# Patient Record
Sex: Female | Born: 1940 | Race: Black or African American | Hispanic: No | Marital: Married | Smoking: Never smoker
Health system: Southern US, Community
[De-identification: ages and names within clinical notes are randomized; demographics above are authoritative.]

## PROBLEM LIST (undated history)

## (undated) DIAGNOSIS — K589 Irritable 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 diarrhea: Secondary | ICD-10-CM

## (undated) DIAGNOSIS — K5900 Constipation, unspecified: Secondary | ICD-10-CM

## (undated) DIAGNOSIS — I340 Nonrheumatic mitral (valve) insufficiency: Secondary | ICD-10-CM

## (undated) DIAGNOSIS — R7303 Prediabetes: Secondary | ICD-10-CM

## (undated) DIAGNOSIS — N281 Cyst of kidney, acquired: Secondary | ICD-10-CM

## (undated) DIAGNOSIS — K5790 Diverticulosis of intestine, part unspecified, without perforation or abscess without bleeding: Secondary | ICD-10-CM

## (undated) DIAGNOSIS — R011 Cardiac murmur, unspecified: Secondary | ICD-10-CM

## (undated) DIAGNOSIS — R0600 Dyspnea, unspecified: Secondary | ICD-10-CM

## (undated) DIAGNOSIS — M545 Low back pain, unspecified: Secondary | ICD-10-CM

## (undated) DIAGNOSIS — M1990 Unspecified osteoarthritis, unspecified site: Secondary | ICD-10-CM

## (undated) DIAGNOSIS — J189 Pneumonia, unspecified organism: Secondary | ICD-10-CM

## (undated) DIAGNOSIS — Z9889 Other specified postprocedural states: Secondary | ICD-10-CM

## (undated) DIAGNOSIS — N1831 Chronic kidney disease, stage 3a: Secondary | ICD-10-CM

## (undated) DIAGNOSIS — N189 Chronic kidney disease, unspecified: Secondary | ICD-10-CM

## (undated) DIAGNOSIS — R112 Nausea with vomiting, unspecified: Secondary | ICD-10-CM

## (undated) DIAGNOSIS — I700 Atherosclerosis of aorta: Secondary | ICD-10-CM

## (undated) DIAGNOSIS — I10 Essential (primary) hypertension: Secondary | ICD-10-CM

## (undated) DIAGNOSIS — I82401 Acute embolism and thrombosis of unspecified deep veins of right lower extremity: Secondary | ICD-10-CM

## (undated) HISTORY — DX: Cardiac murmur, unspecified: R01.1

## (undated) HISTORY — PX: BREAST CYST EXCISION: SHX579

## (undated) HISTORY — DX: Cyst of kidney, acquired: N28.1

## (undated) HISTORY — PX: BREAST SURGERY: SHX581

## (undated) HISTORY — DX: Other specified postprocedural states: Z98.890

## (undated) HISTORY — PX: COLONOSCOPY: SHX174

## (undated) HISTORY — DX: Essential (primary) hypertension: I10

## (undated) HISTORY — DX: Irritable bowel syndrome, unspecified: K58.9

## (undated) HISTORY — PX: BREAST EXCISIONAL BIOPSY: SUR124

## (undated) HISTORY — PX: ABDOMINAL HYSTERECTOMY: SHX81

## (undated) HISTORY — PX: JOINT REPLACEMENT: SHX530

## (undated) HISTORY — PX: OTHER SURGICAL HISTORY: SHX169

---

## 2015-08-21 HISTORY — PX: REPLACEMENT TOTAL KNEE: SUR1224

## 2019-04-30 ENCOUNTER — Other Ambulatory Visit: Payer: Self-pay

## 2019-05-04 ENCOUNTER — Encounter: Payer: Self-pay | Admitting: Family Medicine

## 2019-05-04 ENCOUNTER — Ambulatory Visit (INDEPENDENT_AMBULATORY_CARE_PROVIDER_SITE_OTHER): Payer: Medicare Other | Admitting: Family Medicine

## 2019-05-04 ENCOUNTER — Other Ambulatory Visit: Payer: Self-pay

## 2019-05-04 VITALS — BP 142/88 | HR 70 | Temp 96.5°F | Resp 18 | Ht 64.0 in | Wt 150.6 lb

## 2019-05-04 DIAGNOSIS — Z23 Encounter for immunization: Secondary | ICD-10-CM | POA: Diagnosis not present

## 2019-05-04 DIAGNOSIS — Z0100 Encounter for examination of eyes and vision without abnormal findings: Secondary | ICD-10-CM

## 2019-05-04 DIAGNOSIS — M1711 Unilateral primary osteoarthritis, right knee: Secondary | ICD-10-CM

## 2019-05-04 DIAGNOSIS — I10 Essential (primary) hypertension: Secondary | ICD-10-CM

## 2019-05-04 DIAGNOSIS — R7303 Prediabetes: Secondary | ICD-10-CM | POA: Diagnosis not present

## 2019-05-04 LAB — CBC WITH DIFFERENTIAL/PLATELET
Basophils Absolute: 0 10*3/uL (ref 0.0–0.1)
Basophils Relative: 0.4 % (ref 0.0–3.0)
Eosinophils Absolute: 0.1 10*3/uL (ref 0.0–0.7)
Eosinophils Relative: 0.9 % (ref 0.0–5.0)
HCT: 37.7 % (ref 36.0–46.0)
Hemoglobin: 12.2 g/dL (ref 12.0–15.0)
Lymphocytes Relative: 47.5 % — ABNORMAL HIGH (ref 12.0–46.0)
Lymphs Abs: 3.5 10*3/uL (ref 0.7–4.0)
MCHC: 32.4 g/dL (ref 30.0–36.0)
MCV: 88.7 fl (ref 78.0–100.0)
Monocytes Absolute: 0.7 10*3/uL (ref 0.1–1.0)
Monocytes Relative: 9.1 % (ref 3.0–12.0)
Neutro Abs: 3.1 10*3/uL (ref 1.4–7.7)
Neutrophils Relative %: 42.1 % — ABNORMAL LOW (ref 43.0–77.0)
Platelets: 281 10*3/uL (ref 150.0–400.0)
RBC: 4.25 Mil/uL (ref 3.87–5.11)
RDW: 13.6 % (ref 11.5–15.5)
WBC: 7.4 10*3/uL (ref 4.0–10.5)

## 2019-05-04 LAB — COMPREHENSIVE METABOLIC PANEL
ALT: 9 U/L (ref 0–35)
AST: 17 U/L (ref 0–37)
Albumin: 3.9 g/dL (ref 3.5–5.2)
Alkaline Phosphatase: 76 U/L (ref 39–117)
BUN: 28 mg/dL — ABNORMAL HIGH (ref 6–23)
CO2: 31 mEq/L (ref 19–32)
Calcium: 9.8 mg/dL (ref 8.4–10.5)
Chloride: 100 mEq/L (ref 96–112)
Creatinine, Ser: 1.02 mg/dL (ref 0.40–1.20)
GFR: 63.64 mL/min (ref >60.00)
Glucose, Bld: 95 mg/dL (ref 70–99)
Potassium: 3.7 mEq/L (ref 3.5–5.1)
Sodium: 139 mEq/L (ref 135–145)
Total Bilirubin: 0.4 mg/dL (ref 0.2–1.2)
Total Protein: 6.6 g/dL (ref 6.0–8.3)

## 2019-05-04 LAB — LIPID PANEL
Cholesterol: 165 mg/dL (ref 0–200)
HDL: 55.2 mg/dL (ref >39.00)
LDL Cholesterol: 90 mg/dL (ref 0–99)
NonHDL: 110.03
Total CHOL/HDL Ratio: 3
Triglycerides: 102 mg/dL (ref 0.0–149.0)
VLDL: 20.4 mg/dL (ref 0.0–40.0)

## 2019-05-04 LAB — TSH: TSH: 0.96 u[IU]/mL (ref 0.35–4.50)

## 2019-05-04 LAB — HEMOGLOBIN A1C: Hgb A1c MFr Bld: 6.3 % (ref 4.6–6.5)

## 2019-05-04 NOTE — Progress Notes (Signed)
Subjective:    Patient ID: Donna Ferguson, female    DOB: 03-Apr-1941, 78 y.o.   MRN: [id number]  HPI  Patient presents to clinic to establish PCP.  She moved to area about a month ago from [place].  Currently on hydrochlorothiazide and metoprolol for hypertension.  Feels good on these medicines, denies palpitations, chest pain or lower extremity swelling.  Has not had blood work in approximately 3 to 4 months.  Is agreeable to get new blood work today.  Also states prior to her move, she was in the process of getting a right knee replacement however that had to be put on hold due to the pandemic and also in the move.  Would like to see an orthopedic here to get the process rolling of right knee osteoarthritis treatment/right knee replacement.  Also reports a history of prediabetes, we will get A1c and labs today.  Requesting referral to eye doctor due to being new to the area.  Past medical, social, surgical and family history reviewed and updated accordingly in chart.  Past Medical History:  Diagnosis Date  . Hypertension    Social History   Tobacco Use  . Smoking status: Never Smoker  . Smokeless tobacco: Never Used  Substance Use Topics  . Alcohol use: Never    Frequency: Never   Past Surgical History:  Procedure Laterality Date  . ABDOMINAL HYSTERECTOMY    . cyst N/A    cyst removal both breast  . REPLACEMENT TOTAL KNEE  2017   History reviewed. No pertinent family history.  Review of Systems  Constitutional: Negative for chills, fatigue and fever.  HENT: Negative for congestion, ear pain, sinus pain and sore throat.   Eyes: Negative.   Respiratory: Negative for cough, shortness of breath and wheezing.   Cardiovascular: Negative for chest pain, palpitations and leg swelling.  Gastrointestinal: Negative for abdominal pain, diarrhea, nausea and vomiting.  Genitourinary: Negative for dysuria, frequency and urgency.  Musculoskeletal: +right knee pain  Skin: Negative for color change, pallor and rash.  Neurological: Negative for syncope, light-headedness and headaches.  Psychiatric/Behavioral: The patient is not nervous/anxious.       Objective:   Physical Exam Vitals signs and nursing note reviewed.  Constitutional:      General: She is not in acute distress.    Appearance: She is not ill-appearing, toxic-appearing or diaphoretic.  HENT:     Head: Normocephalic and atraumatic.  Eyes:     General: No scleral icterus.    Extraocular Movements: Extraocular movements intact.     Pupils: Pupils are equal, round, and reactive to light.  Cardiovascular:     Rate and Rhythm: Normal rate and regular rhythm.     Heart sounds: Normal heart sounds.  Pulmonary:     Effort: Pulmonary effort is normal.     Breath sounds: Normal breath sounds.  Musculoskeletal:        General: Tenderness (chroic R knee pain. Hx of knee replacment) present.     Right lower leg: No edema.     Left lower leg: No edema.  Skin:    General: Skin is warm and dry.     Capillary Refill: Capillary refill takes less than 2 seconds.     Coloration: Skin is not jaundiced or pale.  Neurological:     General: No focal deficit present.     Mental Status: She is alert and oriented to person, place, and time.     Gait: Gait normal.  Psychiatric:        Mood and Affect: Mood normal.        Behavior: Behavior normal.    Vitals:   05/04/19 0846  BP: (!) 142/88  Pulse: 70  Resp: 18  Temp: (!) 96.5 F (35.8 C)  SpO2: 90%      Assessment & Plan:    Essential hypertension-stable on current medications hydrochlorothiazide metoprolol.  We will continue.   Prediabetes  -- has history of this.  We will get new blood work.  Osteoarthritis right knee-we will refer to orthopedics for evaluation  Lab work collected in clinic  Flu vaccine given in clinic  We will plan to have patient follow-up in office in about 6 months for recheck of chronic conditions.  She is  aware she can always return clinic sooner or later if issues arise.

## 2019-05-29 DIAGNOSIS — M25561 Pain in right knee: Secondary | ICD-10-CM | POA: Insufficient documentation

## 2019-05-29 DIAGNOSIS — M1711 Unilateral primary osteoarthritis, right knee: Secondary | ICD-10-CM | POA: Insufficient documentation

## 2019-09-24 ENCOUNTER — Telehealth: Payer: Self-pay | Admitting: Internal Medicine

## 2019-09-24 ENCOUNTER — Other Ambulatory Visit: Payer: Self-pay | Admitting: Internal Medicine

## 2019-09-24 ENCOUNTER — Other Ambulatory Visit: Payer: Self-pay | Admitting: Family Medicine

## 2019-09-24 MED ORDER — METOPROLOL SUCCINATE ER 50 MG PO TB24: 50.0000 mg | ORAL_TABLET | Freq: Every day | ORAL | 0 refills | Status: DC

## 2019-09-24 MED ORDER — HYDROCHLOROTHIAZIDE 25 MG PO TABS: 25.0000 mg | ORAL_TABLET | Freq: Every day | ORAL | 0 refills | Status: DC

## 2019-09-24 NOTE — Telephone Encounter (Signed)
Patient has TOC with in March ok to fill medication for 30 days. Was Guse NP patient must have approval to fill.

## 2019-09-24 NOTE — Telephone Encounter (Signed)
meds refilled for 30 days,  Printed and signed because there is no pharmacy in chart,  It's [facility]

## 2019-09-24 NOTE — Telephone Encounter (Signed)
Pt needs a refill on hydrochlorothiazide (HYDRODIURIL) 25 MG tablet and metoprolol succinate (TOPROL-XL) 50 MG 24 hr tablet sent to [facility] on [address] she has made a TOC appt

## 2019-09-28 ENCOUNTER — Other Ambulatory Visit: Payer: Self-pay

## 2019-09-28 MED ORDER — HYDROCHLOROTHIAZIDE 25 MG PO TABS: 25.0000 mg | ORAL_TABLET | Freq: Every day | ORAL | 0 refills | Status: DC

## 2019-09-28 MED ORDER — METOPROLOL SUCCINATE ER 50 MG PO TB24: 50.0000 mg | ORAL_TABLET | Freq: Every day | ORAL | 0 refills | Status: DC

## 2019-10-15 DIAGNOSIS — H2513 Age-related nuclear cataract, bilateral: Secondary | ICD-10-CM | POA: Insufficient documentation

## 2019-10-15 DIAGNOSIS — H518 Other specified disorders of binocular movement: Secondary | ICD-10-CM | POA: Insufficient documentation

## 2019-10-15 DIAGNOSIS — H5032 Intermittent alternating esotropia: Secondary | ICD-10-CM | POA: Insufficient documentation

## 2019-10-28 ENCOUNTER — Ambulatory Visit (INDEPENDENT_AMBULATORY_CARE_PROVIDER_SITE_OTHER): Payer: Medicare Other | Admitting: Family

## 2019-10-28 ENCOUNTER — Other Ambulatory Visit: Payer: Self-pay

## 2019-10-28 ENCOUNTER — Encounter: Payer: Self-pay | Admitting: Family

## 2019-10-28 ENCOUNTER — Other Ambulatory Visit: Payer: Self-pay | Admitting: Family

## 2019-10-28 VITALS — BP 152/80 | HR 62 | Temp 97.1°F | Ht 64.0 in | Wt 154.8 lb

## 2019-10-28 DIAGNOSIS — Z780 Asymptomatic menopausal state: Secondary | ICD-10-CM | POA: Diagnosis not present

## 2019-10-28 DIAGNOSIS — R7303 Prediabetes: Secondary | ICD-10-CM | POA: Insufficient documentation

## 2019-10-28 DIAGNOSIS — Z1231 Encounter for screening mammogram for malignant neoplasm of breast: Secondary | ICD-10-CM | POA: Diagnosis not present

## 2019-10-28 DIAGNOSIS — M25561 Pain in right knee: Secondary | ICD-10-CM | POA: Diagnosis not present

## 2019-10-28 DIAGNOSIS — I10 Essential (primary) hypertension: Secondary | ICD-10-CM

## 2019-10-28 DIAGNOSIS — G8929 Other chronic pain: Secondary | ICD-10-CM

## 2019-10-28 LAB — COMPREHENSIVE METABOLIC PANEL
ALT: 12 U/L (ref 0–35)
AST: 19 U/L (ref 0–37)
Albumin: 4 g/dL (ref 3.5–5.2)
Alkaline Phosphatase: 77 U/L (ref 39–117)
BUN: 22 mg/dL (ref 6–23)
CO2: 29 mEq/L (ref 19–32)
Calcium: 9.8 mg/dL (ref 8.4–10.5)
Chloride: 100 mEq/L (ref 96–112)
Creatinine, Ser: 1.15 mg/dL (ref 0.40–1.20)
GFR: 55.34 mL/min — ABNORMAL LOW (ref >60.00)
Glucose, Bld: 91 mg/dL (ref 70–99)
Potassium: 3.3 mEq/L — ABNORMAL LOW (ref 3.5–5.1)
Sodium: 137 mEq/L (ref 135–145)
Total Bilirubin: 0.7 mg/dL (ref 0.2–1.2)
Total Protein: 6.9 g/dL (ref 6.0–8.3)

## 2019-10-28 MED ORDER — POTASSIUM CHLORIDE ER 10 MEQ PO TBCR: 10.0000 meq | EXTENDED_RELEASE_TABLET | ORAL | 1 refills | Status: DC

## 2019-10-28 NOTE — Progress Notes (Signed)
Subjective:    Patient ID: Donna Ferguson, female    DOB: Sep 01, 1940, 78 y.o.   MRN: [id number]  CC: Donna Ferguson is a 79 y.o. female who presents today to establish care.    HPI: Feels well today. No complaints.  From [place]. Enjoys painting HTN- notes had salty meal last night, [place] [employer].  Denies exertional chest pain or pressure, numbness or tingling radiating to left arm or jaw, palpitations, dizziness, frequent headaches, changes in vision, or shortness of breath.  Has been on hctz, toprol for a long time. Had been 132/80 at orthopedic 10/19/19 No h/o MI.  Right knee pain- planning TKR with Dr Roland Rack.  01/05/2018.  LKR 4 years ago in [place] .  Prediabetes- controlling with diet. Tries to limits sweets.   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   No longer screening for colon cancer.    HISTORY:  Past Medical History:  Diagnosis Date  . Heart murmur   . History of colonoscopy    2019 in [place]; patient states no longer screening for colon cancer.  . Hypertension   . IBS (irritable bowel syndrome)    Past Surgical History:  Procedure Laterality Date  . ABDOMINAL HYSTERECTOMY    . cyst N/A    cyst removal both breast  . REPLACEMENT TOTAL KNEE  2017   Family History  Problem Relation Age of Onset  . Hypertension Mother   . Diabetes Father   . Hypertension Father   . Heart disease Father   . Hypertension Sister   . Hypertension Daughter     Allergies: Patient has no known allergies. Current Outpatient Medications on File Prior to Visit  Medication Sig Dispense Refill  . Ascorbic Acid (VITAMIN C) 1000 MG tablet Take 1,000 mg by mouth daily.    . hydrochlorothiazide (HYDRODIURIL) 25 MG tablet Take 1 tablet (25 mg total) by mouth daily. 30 tablet 0  . metoprolol succinate (TOPROL-XL) 50 MG 24 hr tablet Take 1 tablet (50 mg total) by mouth daily. Take with or immediately following a meal. 30 tablet 0   No current  facility-administered medications on file prior to visit.    Social History   Tobacco Use  . Smoking status: Never Smoker  . Smokeless tobacco: Never Used  Substance Use Topics  . Alcohol use: Never  . Drug use: Never    Review of Systems  Constitutional: Negative for chills and fever.  Respiratory: Negative for cough, shortness of breath and wheezing.   Cardiovascular: Negative for chest pain, palpitations and leg swelling.  Gastrointestinal: Negative for nausea and vomiting.  Neurological: Negative for headaches.      Objective:    BP (!) 152/80   Pulse 62   Temp (!) 97.1 F (36.2 C) (Temporal)   Ht 5\' 4"  (1.626 m)   Wt 154 lb 12.8 oz (70.2 kg)   SpO2 99%   BMI 26.57 kg/m  BP Readings from Last 3 Encounters:  10/28/19 (!) 152/80  05/04/19 (!) 142/88   Wt Readings from Last 3 Encounters:  10/28/19 154 lb 12.8 oz (70.2 kg)  05/04/19 150 lb 9.6 oz (68.3 kg)    Physical Exam Vitals reviewed.  Constitutional:      Appearance: She is well-developed.  HENT:     Mouth/Throat:     Pharynx: Uvula midline.  Eyes:     Conjunctiva/sclera: Conjunctivae normal.     Pupils: Pupils are equal, round, and reactive to light.  Comments: Fundus normal bilaterally.   Cardiovascular:     Rate and Rhythm: Normal rate and regular rhythm.     Pulses: Normal pulses.     Heart sounds: Normal heart sounds.  Pulmonary:     Effort: Pulmonary effort is normal.     Breath sounds: Normal breath sounds. No wheezing, rhonchi or rales.  Skin:    General: Skin is warm and dry.  Neurological:     Mental Status: She is alert.     Cranial Nerves: No cranial nerve deficit.     Sensory: No sensory deficit.     Deep Tendon Reflexes:     Reflex Scores:      Bicep reflexes are 2+ on the right side and 2+ on the left side.      Patellar reflexes are 2+ on the right side and 2+ on the left side.    Comments: Grip equal and strong bilateral upper extremities. Gait strong and steady. Able to  perform rapid alternating movement without difficulty.   Psychiatric:        Speech: Speech normal.        Behavior: Behavior normal.        Thought Content: Thought content normal.        Assessment & Plan:   Problem List Items Addressed This Visit      Cardiovascular and Mediastinum   HTN (hypertension)    Elevated today however patient notes indiscretion with salt last night.  Looking back 9 days ago, blood pressure was much better controlled when she was seeing orthopedic 132/88.  In the absence of symptoms today, normal neurologic exam, patient and I have deferred any changes to her regimen.  She  offered to purchase a blood pressure machine which I think is appropriate.  Have given her a prescription to do this.  I advised her blood pressure goal closer to 130/80.  She will keep blood pressure log and call us with readings.  Close follow-up in 6 weeks.      Relevant Orders   Comprehensive metabolic panel     Other   Prediabetes    Stable, controlled with diet.  Will follow      Right knee pain    Chronic, pending total knee replacement with Dr. Roland Rack.  She will return for preop visit for this       Other Visit Diagnoses    Encounter for screening mammogram for malignant neoplasm of breast    -  Primary   Relevant Orders   MM 3D SCREEN BREAST BILATERAL   DG Bone Density   Asymptomatic menopausal state       Relevant Orders   DG Bone Density    Of note, patient declines colonoscopy at this time.  I have ordered mammogram, bone density, and patient understands to schedule this 6 weeks out due to her recent Covid vaccine.   I am having Jo-Ann Eatherly maintain her hydrochlorothiazide, metoprolol succinate, and vitamin C.   No orders of the defined types were placed in this encounter.   Return precautions given.   Risks, benefits, and alternatives of the medications and treatment plan prescribed today were discussed, and patient expressed understanding.    Education regarding symptom management and diagnosis given to patient on AVS.  Continue to follow with Guse, Jacquelynn Cree, FNP for routine health maintenance.   Marykay Verdun and I agreed with plan.   [name], FNP

## 2019-10-28 NOTE — Assessment & Plan Note (Signed)
Elevated today however patient notes indiscretion with salt last night.  Looking back 9 days ago, blood pressure was much better controlled when she was seeing orthopedic 132/88.  In the absence of symptoms today, normal neurologic exam, patient and I have deferred any changes to her regimen.  She  offered to purchase a blood pressure machine which I think is appropriate.  Have given her a prescription to do this.  I advised her blood pressure goal closer to 130/80.  She will keep blood pressure log and call us with readings.  Close follow-up in 6 weeks.

## 2019-10-28 NOTE — Patient Instructions (Addendum)
Please call [facility] and see if you can find out what pneumonia vaccines you had.   Please call call and schedule your 3D mammogram, bone density scan as discussed. Please wait 6 weeks to schedule.    [place]  [place], [place]  Purchase blood pressure machine  Monitor blood pressure,  Goal is less than 130/80; if persistently higher, please make sooner follow up appointment so we can recheck you blood pressure and manage medications   Follow up in 6 weeks for pre-operative visit.

## 2019-10-28 NOTE — Assessment & Plan Note (Signed)
Chronic, pending total knee replacement with Dr. Roland Rack.  She will return for preop visit for this

## 2019-10-28 NOTE — Assessment & Plan Note (Signed)
Stable, controlled with diet.  Will follow

## 2019-11-02 ENCOUNTER — Telehealth: Payer: Self-pay | Admitting: Family

## 2019-11-02 MED ORDER — HYDROCHLOROTHIAZIDE 25 MG PO TABS: 25.0000 mg | ORAL_TABLET | Freq: Every day | ORAL | 1 refills | Status: DC

## 2019-11-02 NOTE — Telephone Encounter (Signed)
Pt needs a 90 supply of hydrochlorothiazide sent to [facility]. She said she though it was sent in with her other two prescriptions from her appt on 10/28/19. She only has two pills left.

## 2019-11-04 ENCOUNTER — Telehealth: Payer: Self-pay | Admitting: Family

## 2019-11-04 DIAGNOSIS — I10 Essential (primary) hypertension: Secondary | ICD-10-CM

## 2019-11-04 MED ORDER — AMLODIPINE BESYLATE 2.5 MG PO TABS: 2.5000 mg | ORAL_TABLET | Freq: Every evening | ORAL | 3 refills | Status: DC

## 2019-11-04 NOTE — Progress Notes (Signed)
BP readings:  12th 140/85 13th 124/88 15th 143/58- before medication 16th 134/83  Patient did not have HR.

## 2019-11-04 NOTE — Telephone Encounter (Signed)
Spoke with patient She feels well Denies exertional chest pain or pressure, numbness or tingling radiating to left arm or jaw, palpitations, dizziness, frequent headaches, changes in vision, or shortness of breath.  Goal to approach 130/80  Will start amlodipine in the evening. Will take hctz, metoloprol in the morning  Follow up in april

## 2019-11-04 NOTE — Telephone Encounter (Signed)
I called to triage patient patient denies chest pain, SOB, HA, blurry vision, left arm or jaw pain, numbness or tingling. Patient stated that she feels fine. Pt did not eat any salt today & was unsure why it was more elevated. She has only had water & some Lipton tea to drink. She took BP with feet flat in the floor, but was unsure how long she had been resting. She was advised ED if any worsening symptoms & pt verbalized understanding.   Please advise?

## 2019-11-04 NOTE — Telephone Encounter (Signed)
Pt called in an blood pressure readings are 160/91 and the other arm is 150/90. She said Joycelyn Schmid would want to know that is going up.

## 2019-11-06 NOTE — Telephone Encounter (Signed)
Pt called to see if she could schedule a BP check to make sure her BP monitor is correct  Because of her BP readings

## 2019-11-06 NOTE — Telephone Encounter (Signed)
We have no nurse visit appointments available next week. Could I use one of your same days?

## 2019-11-06 NOTE — Telephone Encounter (Signed)
Yes of course

## 2019-11-06 NOTE — Telephone Encounter (Signed)
Patient scheduled SAME DAY Monday at 12p.

## 2019-11-07 ENCOUNTER — Emergency Department: Payer: Medicare Other

## 2019-11-07 ENCOUNTER — Other Ambulatory Visit: Payer: Self-pay

## 2019-11-07 ENCOUNTER — Emergency Department
Admission: EM | Admit: 2019-11-07 | Discharge: 2019-11-07 | Disposition: A | Discharge status: Home / Self Care | Payer: Medicare Other | Attending: Emergency Medicine | Admitting: Emergency Medicine

## 2019-11-07 DIAGNOSIS — R079 Chest pain, unspecified: Secondary | ICD-10-CM

## 2019-11-07 DIAGNOSIS — I10 Essential (primary) hypertension: Secondary | ICD-10-CM | POA: Insufficient documentation

## 2019-11-07 DIAGNOSIS — Z79899 Other long term (current) drug therapy: Secondary | ICD-10-CM | POA: Diagnosis not present

## 2019-11-07 DIAGNOSIS — Z96659 Presence of unspecified artificial knee joint: Secondary | ICD-10-CM | POA: Insufficient documentation

## 2019-11-07 DIAGNOSIS — R002 Palpitations: Secondary | ICD-10-CM | POA: Diagnosis not present

## 2019-11-07 DIAGNOSIS — R0789 Other chest pain: Secondary | ICD-10-CM | POA: Diagnosis present

## 2019-11-07 LAB — CBC
HCT: 41.9 % (ref 36.0–46.0)
Hemoglobin: 14 g/dL (ref 12.0–15.0)
MCH: 28.6 pg (ref 26.0–34.0)
MCHC: 33.4 g/dL (ref 30.0–36.0)
MCV: 85.7 fL (ref 80.0–100.0)
Platelets: 289 10*3/uL (ref 150–400)
RBC: 4.89 MIL/uL (ref 3.87–5.11)
RDW: 13.2 % (ref 11.5–15.5)
WBC: 8 10*3/uL (ref 4.0–10.5)
nRBC: 0 % (ref 0.0–0.2)

## 2019-11-07 LAB — COMPREHENSIVE METABOLIC PANEL
ALT: 15 U/L (ref 0–44)
AST: 24 U/L (ref 15–41)
Albumin: 3.6 g/dL (ref 3.5–5.0)
Alkaline Phosphatase: 73 U/L (ref 38–126)
Anion gap: 10 (ref 5–15)
BUN: 18 mg/dL (ref 8–23)
CO2: 28 mmol/L (ref 22–32)
Calcium: 9.7 mg/dL (ref 8.9–10.3)
Chloride: 96 mmol/L — ABNORMAL LOW (ref 98–111)
Creatinine, Ser: 1.09 mg/dL — ABNORMAL HIGH (ref 0.44–1.00)
GFR calc Af Amer: 57 mL/min — ABNORMAL LOW (ref >60)
GFR calc non Af Amer: 49 mL/min — ABNORMAL LOW (ref >60)
Glucose, Bld: 115 mg/dL — ABNORMAL HIGH (ref 70–99)
Potassium: 3.3 mmol/L — ABNORMAL LOW (ref 3.5–5.1)
Sodium: 134 mmol/L — ABNORMAL LOW (ref 135–145)
Total Bilirubin: 1 mg/dL (ref 0.3–1.2)
Total Protein: 7.3 g/dL (ref 6.5–8.1)

## 2019-11-07 LAB — TROPONIN I (HIGH SENSITIVITY): Troponin I (High Sensitivity): <2 ng/L (ref <18)

## 2019-11-07 NOTE — ED Triage Notes (Signed)
Pt states left sided chest pain that has been intermittent since yesterday. Pt states she also has had hypertension as well. Pt states has felt shob.

## 2019-11-07 NOTE — ED Provider Notes (Signed)
[facility] Emergency Department Provider Note  Time seen: 8:06 AM  I have reviewed the triage vital signs and the nursing notes.   HISTORY  Chief Complaint Chest Pain   HPI Donna Ferguson is a 79 y.o. female with a past medical history of hypertension presents to the emergency department with concerns of hypertension and chest discomfort.  According to the patient for the past few days she has noticed significant fluctuations in her blood pressure.  States sometimes will be normal sometimes will be very high.  She states this has been concerning her, yesterday she noticed some mild discomfort in her left arm and a "pinch" of pain in her left chest.  Patient states she was concerned about this but was trying to wait to see her doctor, this morning she felt very nervous about the blood pressure and felt that she had a palpitation in her chest so she came to the emergency department for evaluation.  Denies any chest discomfort at this time.   Past Medical History:  Diagnosis Date  . Heart murmur   . History of colonoscopy    2019 in [place]; patient states no longer screening for colon cancer.  . Hypertension   . IBS (irritable bowel syndrome)     Patient Active Problem List   Diagnosis Date Noted  . HTN (hypertension) 10/28/2019  . Prediabetes 10/28/2019  . Right knee pain 10/28/2019    Past Surgical History:  Procedure Laterality Date  . ABDOMINAL HYSTERECTOMY    . cyst N/A    cyst removal both breast  . REPLACEMENT TOTAL KNEE  2017    Prior to Admission medications   Medication Sig Start Date End Date Taking? Authorizing Provider  amLODipine (NORVASC) 2.5 MG tablet Take 1 tablet (2.5 mg total) by mouth every evening. 11/04/19   Burnard Hawthorne, FNP  Ascorbic Acid (VITAMIN C) 1000 MG tablet Take 1,000 mg by mouth daily.    [provider]  hydrochlorothiazide (HYDRODIURIL) 25 MG tablet Take 1 tablet (25 mg total) by mouth daily. 11/02/19    Burnard Hawthorne, FNP  metoprolol succinate (TOPROL-XL) 50 MG 24 hr tablet Take 1 tablet (50 mg total) by mouth daily. Take with or immediately following a meal. 09/28/19   Crecencio Mc, MD  potassium chloride (KLOR-CON) 10 MEQ tablet Take 1 tablet (10 mEq total) by mouth every other day. 10/28/19   Burnard Hawthorne, FNP    No Known Allergies  Family History  Problem Relation Age of Onset  . Hypertension Mother   . Diabetes Father   . Hypertension Father   . Heart disease Father   . Hypertension Sister   . Hypertension Daughter     Social History Social History   Tobacco Use  . Smoking status: Never Smoker  . Smokeless tobacco: Never Used  Substance Use Topics  . Alcohol use: Never  . Drug use: Never    Review of Systems Constitutional: Negative for fever. Cardiovascular: "Pinch" of chest pain last night.  Palpitations this morning. Respiratory: Negative for shortness of breath.  Negative for cough. Gastrointestinal: Negative for abdominal pain Musculoskeletal: Mild left arm pain yesterday. Skin: Negative for skin complaints  Neurological: Negative for headache All other ROS negative  ____________________________________________   PHYSICAL EXAM:  VITAL SIGNS: ED Triage Vitals [11/07/19 0558]  Enc Vitals Group     BP (!) 151/66     Pulse Rate 99     Resp 16     Temp  98.5 F (36.9 C)     Temp Source Oral     SpO2 99 %     Weight 152 lb (68.9 kg)     Height 5\' 3"  (1.6 m)     Head Circumference      Peak Flow      Pain Score 5     Pain Loc      Pain Edu?      Excl. in [place]?    Constitutional: Alert and oriented. Well appearing and in no distress. Eyes: Normal exam ENT      Head: Normocephalic and atraumatic.      Mouth/Throat: Mucous membranes are moist. Cardiovascular: Normal rate, regular rhythm.  Respiratory: Normal respiratory effort without tachypnea nor retractions. Breath sounds are clear Gastrointestinal: Soft and nontender. No distention.    Musculoskeletal: Nontender with normal range of motion in all extremities.  Neurologic:  Normal speech and language. No gross focal neurologic deficits  Skin:  Skin is warm, dry and intact.  Psychiatric: Mood and affect are normal   ____________________________________________    EKG  EKG viewed and interpreted by myself shows a normal sinus rhythm at 81 bpm with a narrow QRS, normal axis, normal intervals, no concerning ST changes.  ____________________________________________    RADIOLOGY  Chest x-ray is negative  ____________________________________________   INITIAL IMPRESSION / ASSESSMENT AND PLAN / ED COURSE  Pertinent labs & imaging results that were available during my care of the patient were reviewed by me and considered in my medical decision making (see chart for details).   Patient presents to the emergency department for fluctuations in her blood pressure and discomfort in her left arm and chest yesterday felt palpitations this morning.  Patient states she was "nervous" that something could be wrong with her heart.  Currently the patient appears extremely well, very reassuring physical exam.  Patient's blood pressure initially 151/66, on recheck while I was in the room is 120/80.  Pulse rate remains normal around 80 bpm.  Patient's labs are reassuring including a negative troponin.  Normal-appearing EKG.  I have reviewed the patient's chest x-ray images which appear normal, awaiting official radiology read.  X-rays read as normal.  Overall patient continues to appear extremely well.  We will discharge from the emergency department with PCP follow-up.  I did discuss cardiology follow-up for possible Holter monitor.  Patient agreeable to plan of care.  Donna Ferguson was evaluated in Emergency Department on 11/07/2019 for the symptoms described in the history of present illness. She was evaluated in the context of the global COVID-19 pandemic, which necessitated  consideration that the patient might be at risk for infection with the SARS-CoV-2 virus that causes COVID-19. Institutional protocols and algorithms that pertain to the evaluation of patients at risk for COVID-19 are in a state of rapid change based on information released by regulatory bodies including the CDC and federal and state organizations. These policies and algorithms were followed during the patient's care in the ED.  ____________________________________________   FINAL CLINICAL IMPRESSION(S) / ED DIAGNOSES  Hypertension Palpitations.   Harvest Dark, MD 11/07/19 [phone]

## 2019-11-09 ENCOUNTER — Ambulatory Visit: Payer: Medicare Other | Admitting: Family

## 2019-11-09 NOTE — Progress Notes (Signed)
Subjective:    Patient ID: Donna Ferguson, female    DOB: May 25, 1941, 79 y.o.   MRN: [id number]  CC: Donna Ferguson is a 79 y.o. female who presents today for follow up.   HPI: Feels well today, no complaints   HTN- has been on hctz 25mg  with kcl 10 meq QOD. Doesn't think BP cuff is accurate.  Is not checking blood pressure last couple days at home, as it makes her quite anxious.  She believes that is the reason for her chest pain 4 days ago emergency room.CP , palpitations resolved.   Denies leg swelling,  numbness or tingling radiating to left arm or jaw, palpitations, dizziness, frequent headaches, changes in vision, or shortness of breath.     ED 11/07/19- seen in ED for chest pain.  CXR no acute findings Negative troponin  Right knee TKR planned for 01/06/2020.    HISTORY:  Past Medical History:  Diagnosis Date  . Heart murmur   . History of colonoscopy    2019 in [place]; patient states no longer screening for colon cancer.  . Hypertension   . IBS (irritable bowel syndrome)    Past Surgical History:  Procedure Laterality Date  . ABDOMINAL HYSTERECTOMY    . cyst N/A    cyst removal both breast  . REPLACEMENT TOTAL KNEE  2017   Family History  Problem Relation Age of Onset  . Hypertension Mother   . Diabetes Father   . Hypertension Father   . Heart disease Father   . Hypertension Sister   . Hypertension Daughter     Allergies: Patient has no known allergies. Current Outpatient Medications on File Prior to Visit  Medication Sig Dispense Refill  . Ascorbic Acid (VITAMIN C) 1000 MG tablet Take 1,000 mg by mouth daily.    . metoprolol succinate (TOPROL-XL) 50 MG 24 hr tablet Take 1 tablet (50 mg total) by mouth daily. Take with or immediately following a meal. 30 tablet 0   No current facility-administered medications on file prior to visit.    Social History   Tobacco Use  . Smoking status: Never Smoker  . Smokeless tobacco: Never Used  Substance Use Topics    . Alcohol use: Never  . Drug use: Never    Review of Systems  Constitutional: Negative for chills and fever.  Respiratory: Negative for cough and shortness of breath.   Cardiovascular: Negative for chest pain, palpitations and leg swelling.  Gastrointestinal: Negative for nausea and vomiting.      Objective:    BP 126/80 (BP Location: Left Arm, Patient Position: Sitting, Cuff Size: Normal)   Pulse 76   Temp (!) 97.3 F (36.3 C) (Temporal)   Resp 14   Ht 5\' 3"  (1.6 m)   Wt 152 lb (68.9 kg)   SpO2 97%   BMI 26.93 kg/m  BP Readings from Last 3 Encounters:  11/11/19 126/80  11/07/19 124/67  10/28/19 (!) 152/80   Wt Readings from Last 3 Encounters:  11/11/19 152 lb (68.9 kg)  11/07/19 152 lb (68.9 kg)  10/28/19 154 lb 12.8 oz (70.2 kg)    Physical Exam Vitals reviewed.  Constitutional:      Appearance: She is well-developed.  Eyes:     Conjunctiva/sclera: Conjunctivae normal.  Cardiovascular:     Rate and Rhythm: Normal rate and regular rhythm.     Pulses: Normal pulses.     Heart sounds: Normal heart sounds.  Pulmonary:     Effort: Pulmonary effort is  normal.     Breath sounds: Normal breath sounds. No wheezing, rhonchi or rales.  Musculoskeletal:     Right lower leg: No edema.     Left lower leg: No edema.  Skin:    General: Skin is warm and dry.  Neurological:     Mental Status: She is alert.  Psychiatric:        Speech: Speech normal.        Behavior: Behavior normal.        Thought Content: Thought content normal.        Assessment & Plan:   Problem List Items Addressed This Visit      Cardiovascular and Mediastinum   HTN (hypertension) - Primary    Reviewed ED course. Pleased that CP, palpitations resolved. BP at goal today.  We discussed options and staying on current medication regimen however patient and I  both were more comfortable with lowering hydrochlorothiazide due to history of hyperkalemia, and increasing amlodipine.  She understands  to pay close attention to blood pressure during this change.  She will call me with any concerns.  Pending labs today.  We will also proceed with consult with cardiology due to her upcoming knee replacement.      Relevant Medications   hydrochlorothiazide (MICROZIDE) 12.5 MG capsule   amLODipine (NORVASC) 5 MG tablet   Other Relevant Orders   Basic metabolic panel   Magnesium   Ambulatory referral to Cardiology       I have discontinued Donna Ferguson's potassium chloride and hydrochlorothiazide. I have also changed her amLODipine. Additionally, I am having her start on hydrochlorothiazide. Lastly, I am having her maintain her metoprolol succinate and vitamin C.   Meds ordered this encounter  Medications  . hydrochlorothiazide (MICROZIDE) 12.5 MG capsule    Sig: Take 1 capsule (12.5 mg total) by mouth daily.    Dispense:  90 capsule    Refill:  0    Order Specific Question:   Supervising Provider    Answer:   Deborra Medina L [[id number]]  . amLODipine (NORVASC) 5 MG tablet    Sig: Take 1 tablet (5 mg total) by mouth every evening.    Dispense:  90 tablet    Refill:  1    Order Specific Question:   Supervising Provider    Answer:   Crecencio Mc [[id number]]    Return precautions given.   Risks, benefits, and alternatives of the medications and treatment plan prescribed today were discussed, and patient expressed understanding.   Education regarding symptom management and diagnosis given to patient on AVS.  Continue to follow with Burnard Hawthorne, FNP for routine health maintenance.   Donna Ferguson and I agreed with plan.   Donna Paris, FNP

## 2019-11-11 ENCOUNTER — Ambulatory Visit (INDEPENDENT_AMBULATORY_CARE_PROVIDER_SITE_OTHER): Payer: Medicare Other | Admitting: Family

## 2019-11-11 ENCOUNTER — Other Ambulatory Visit: Payer: Self-pay

## 2019-11-11 ENCOUNTER — Encounter: Payer: Self-pay | Admitting: Family

## 2019-11-11 ENCOUNTER — Other Ambulatory Visit: Payer: Self-pay | Admitting: Family

## 2019-11-11 VITALS — BP 126/80 | HR 76 | Temp 97.3°F | Resp 14 | Ht 63.0 in | Wt 152.0 lb

## 2019-11-11 DIAGNOSIS — I10 Essential (primary) hypertension: Secondary | ICD-10-CM | POA: Diagnosis not present

## 2019-11-11 DIAGNOSIS — R899 Unspecified abnormal finding in specimens from other organs, systems and tissues: Secondary | ICD-10-CM

## 2019-11-11 LAB — BASIC METABOLIC PANEL
BUN: 21 mg/dL (ref 6–23)
CO2: 27 mEq/L (ref 19–32)
Calcium: 10 mg/dL (ref 8.4–10.5)
Chloride: 95 mEq/L — ABNORMAL LOW (ref 96–112)
Creatinine, Ser: 1.28 mg/dL — ABNORMAL HIGH (ref 0.40–1.20)
GFR: 48.9 mL/min — ABNORMAL LOW (ref >60.00)
Glucose, Bld: 93 mg/dL (ref 70–99)
Potassium: 4.1 mEq/L (ref 3.5–5.1)
Sodium: 133 mEq/L — ABNORMAL LOW (ref 135–145)

## 2019-11-11 LAB — MAGNESIUM: Magnesium: 1.9 mg/dL (ref 1.5–2.5)

## 2019-11-11 MED ORDER — HYDROCHLOROTHIAZIDE 12.5 MG PO CAPS: 12.5000 mg | ORAL_CAPSULE | Freq: Every day | ORAL | 0 refills | Status: DC

## 2019-11-11 MED ORDER — AMLODIPINE BESYLATE 5 MG PO TABS: 5.0000 mg | ORAL_TABLET | Freq: Every evening | ORAL | 1 refills | Status: DC

## 2019-11-11 NOTE — Assessment & Plan Note (Addendum)
Reviewed ED course. Pleased that CP, palpitations resolved. BP at goal today.  We discussed options and staying on current medication regimen however patient and I  both were more comfortable with lowering hydrochlorothiazide due to history of hyperkalemia, and increasing amlodipine.  She understands to pay close attention to blood pressure during this change.  She will call me with any concerns.  Pending labs today.  We will also proceed with consult with cardiology due to her upcoming knee replacement.

## 2019-11-11 NOTE — Patient Instructions (Signed)
As discussed Couple changes to your blood pressure regimen.  We will have you increase  amlodipine from 2.5 to 5 mg.  We will decrease your hydrochlorothiazide 25 mg to 12.5 mg.  You may STOP the potassium at this time.  We will get labs today and see what further changes are necessary. Referral placed to cardiology ahead of your knee replacement.  Please ensure you give [place] a call if you hear from [place] in this regard.   Managing Your Hypertension Hypertension is commonly called high blood pressure. This is when the force of your blood pressing against the walls of your arteries is too strong. Arteries are blood vessels that carry blood from your heart throughout your body. Hypertension forces the heart to work harder to pump blood, and may cause the arteries to become narrow or stiff. Having untreated or uncontrolled hypertension can cause heart attack, stroke, kidney disease, and other problems. What are blood pressure readings? A blood pressure reading consists of a higher number over a lower number. Ideally, your blood pressure should be below 120/80. The first ("top") number is called the systolic pressure. It is a measure of the pressure in your arteries as your heart beats. The second ("bottom") number is called the diastolic pressure. It is a measure of the pressure in your arteries as the heart relaxes. What does my blood pressure reading mean? Blood pressure is classified into four stages. Based on your blood pressure reading, your health care provider may use the following stages to determine what type of treatment you need, if any. Systolic pressure and diastolic pressure are measured in a unit called mm Hg. Normal  Systolic pressure: below 123456.  Diastolic pressure: below 80. Elevated  Systolic pressure: [id number].  Diastolic pressure: below 80. Hypertension stage 1  Systolic pressure: 0000000.  Diastolic pressure: [id number]. Hypertension stage 2  Systolic pressure: [id number] or  above.  Diastolic pressure: 90 or above. What health risks are associated with hypertension? Managing your hypertension is an important responsibility. Uncontrolled hypertension can lead to:  A heart attack.  A stroke.  A weakened blood vessel (aneurysm).  Heart failure.  Kidney damage.  Eye damage.  Metabolic syndrome.  Memory and concentration problems. What changes can I make to manage my hypertension? Hypertension can be managed by making lifestyle changes and possibly by taking medicines. Your health care provider will help you make a plan to bring your blood pressure within a normal range. Eating and drinking   Eat a diet that is high in fiber and potassium, and low in salt (sodium), added sugar, and fat. An example eating plan is called the DASH (Dietary Approaches to Stop Hypertension) diet. To eat this way: ? Eat plenty of fresh fruits and vegetables. Try to fill half of your plate at each meal with fruits and vegetables. ? Eat whole grains, such as whole wheat pasta, brown rice, or whole grain bread. Fill about one quarter of your plate with whole grains. ? Eat low-fat diary products. ? Avoid fatty cuts of meat, processed or cured meats, and poultry with skin. Fill about one quarter of your plate with lean proteins such as fish, chicken without skin, beans, eggs, and tofu. ? Avoid premade and processed foods. These tend to be higher in sodium, added sugar, and fat.  Reduce your daily sodium intake. Most people with hypertension should eat less than 1,500 mg of sodium a day.  Limit alcohol intake to no more than 1 drink a day for nonpregnant women  and 2 drinks a day for men. One drink equals 12 oz of beer, 5 oz of wine, or 1 oz of hard liquor. Lifestyle  Work with your health care provider to maintain a healthy body weight, or to lose weight. Ask what an ideal weight is for you.  Get at least 30 minutes of exercise that causes your heart to beat faster (aerobic  exercise) most days of the week. Activities may include walking, swimming, or biking.  Include exercise to strengthen your muscles (resistance exercise), such as weight lifting, as part of your weekly exercise routine. Try to do these types of exercises for 30 minutes at least 3 days a week.  Do not use any products that contain nicotine or tobacco, such as cigarettes and e-cigarettes. If you need help quitting, ask your health care provider.  Control any long-term (chronic) conditions you have, such as high cholesterol or diabetes. Monitoring  Monitor your blood pressure at home as told by your health care provider. Your personal target blood pressure may vary depending on your medical conditions, your age, and other factors.  Have your blood pressure checked regularly, as often as told by your health care provider. Working with your health care provider  Review all the medicines you take with your health care provider because there may be side effects or interactions.  Talk with your health care provider about your diet, exercise habits, and other lifestyle factors that may be contributing to hypertension.  Visit your health care provider regularly. Your health care provider can help you create and adjust your plan for managing hypertension. Will I need medicine to control my blood pressure? Your health care provider may prescribe medicine if lifestyle changes are not enough to get your blood pressure under control, and if:  Your systolic blood pressure is 130 or higher.  Your diastolic blood pressure is 80 or higher. Take medicines only as told by your health care provider. Follow the directions carefully. Blood pressure medicines must be taken as prescribed. The medicine does not work as well when you skip doses. Skipping doses also puts you at risk for problems. Contact a health care provider if:  You think you are having a reaction to medicines you have taken.  You have repeated  (recurrent) headaches.  You feel dizzy.  You have swelling in your ankles.  You have trouble with your vision. Get help right away if:  You develop a severe headache or confusion.  You have unusual weakness or numbness, or you feel faint.  You have severe pain in your chest or abdomen.  You vomit repeatedly.  You have trouble breathing. Summary  Hypertension is when the force of blood pumping through your arteries is too strong. If this condition is not controlled, it may put you at risk for serious complications.  Your personal target blood pressure may vary depending on your medical conditions, your age, and other factors. For most people, a normal blood pressure is less than 120/80.  Hypertension is managed by lifestyle changes, medicines, or both. Lifestyle changes include weight loss, eating a healthy, low-sodium diet, exercising more, and limiting alcohol. This information is not intended to replace advice given to you by your health care provider. Make sure you discuss any questions you have with your health care provider. Document Revised: 11/28/2018 Document Reviewed: 07/04/2016 Elsevier Patient Education  [place].

## 2019-11-18 ENCOUNTER — Telehealth: Payer: Self-pay | Admitting: Family

## 2019-11-18 NOTE — Telephone Encounter (Signed)
I called pt and left a vm to call ofc.

## 2019-11-30 ENCOUNTER — Encounter: Payer: Self-pay | Admitting: Cardiology

## 2019-11-30 ENCOUNTER — Ambulatory Visit (INDEPENDENT_AMBULATORY_CARE_PROVIDER_SITE_OTHER): Payer: Medicare Other | Admitting: Cardiology

## 2019-11-30 ENCOUNTER — Other Ambulatory Visit: Payer: Self-pay

## 2019-11-30 VITALS — BP 126/70 | HR 70 | Ht 63.0 in | Wt 154.0 lb

## 2019-11-30 DIAGNOSIS — I340 Nonrheumatic mitral (valve) insufficiency: Secondary | ICD-10-CM | POA: Diagnosis not present

## 2019-11-30 DIAGNOSIS — Z01818 Encounter for other preprocedural examination: Secondary | ICD-10-CM | POA: Diagnosis not present

## 2019-11-30 DIAGNOSIS — I10 Essential (primary) hypertension: Secondary | ICD-10-CM | POA: Diagnosis not present

## 2019-11-30 NOTE — Patient Instructions (Signed)
Medication Instructions:  Your physician recommends that you continue on your current medications as directed. Please refer to the Current Medication list given to you today.  *If you need a refill on your cardiac medications before your next appointment, please call your pharmacy*   Lab Work: None ordered If you have labs (blood work) drawn today and your tests are completely normal, you will receive your results only by: Marland Kitchen MyChart Message (if you have MyChart) OR . A paper copy in the mail If you have any lab test that is abnormal or we need to change your treatment, we will call you to review the results.   Testing/Procedures: Your physician has requested that you have an echocardiogram. Echocardiography is a painless test that uses sound waves to create images of your heart. It provides your doctor with information about the size and shape of your heart and how well your heart's chambers and valves are working. This procedure takes approximately one hour. There are no restrictions for this procedure.     Follow-Up: At [facility], you and your health needs are our priority.  As part of our continuing mission to provide you with exceptional heart care, we have created designated Provider Care Teams.  These Care Teams include your primary Cardiologist (physician) and Advanced Practice Providers (APPs -  Physician Assistants and Nurse Practitioners) who all work together to provide you with the care you need, when you need it.  We recommend signing up for the patient portal called "MyChart".  Sign up information is provided on this After Visit Summary.  MyChart is used to connect with patients for Virtual Visits (Telemedicine).  Patients are able to view lab/test results, encounter notes, upcoming appointments, etc.  Non-urgent messages can be sent to your provider as well.   To learn more about what you can do with MyChart, go to [email or web address].    Your next appointment:   3  month(s)  The format for your next appointment:   In Person  Provider:    You may see Kate Sable, MD or one of the following Advanced Practice Providers on your designated Care Team:    Murray Hodgkins, NP  Christell Faith, PA-C  Marrianne Mood, PA-C    Other Instructions  Echocardiogram An echocardiogram is a procedure that uses painless sound waves (ultrasound) to produce an image of the heart. Images from an echocardiogram can provide important information about:  Signs of coronary artery disease (CAD).  Aneurysm detection. An aneurysm is a weak or damaged part of an artery wall that bulges out from the normal force of blood pumping through the body.  Heart size and shape. Changes in the size or shape of the heart can be associated with certain conditions, including heart failure, aneurysm, and CAD.  Heart muscle function.  Heart valve function.  Signs of a past heart attack.  Fluid buildup around the heart.  Thickening of the heart muscle.  A tumor or infectious growth around the heart valves. Tell a health care provider about:  Any allergies you have.  All medicines you are taking, including vitamins, herbs, eye drops, creams, and over-the-counter medicines.  Any blood disorders you have.  Any surgeries you have had.  Any medical conditions you have.  Whether you are pregnant or may be pregnant. What are the risks? Generally, this is a safe procedure. However, problems may occur, including:  Allergic reaction to dye (contrast) that may be used during the procedure. What happens before the procedure? No  specific preparation is needed. You may eat and drink normally. What happens during the procedure?   An IV tube may be inserted into one of your veins.  You may receive contrast through this tube. A contrast is an injection that improves the quality of the pictures from your heart.  A gel will be applied to your chest.  A wand-like tool  (transducer) will be moved over your chest. The gel will help to transmit the sound waves from the transducer.  The sound waves will harmlessly bounce off of your heart to allow the heart images to be captured in real-time motion. The images will be recorded on a computer. The procedure may vary among health care providers and hospitals. What happens after the procedure?  You may return to your normal, everyday life, including diet, activities, and medicines, unless your health care provider tells you not to do that. Summary  An echocardiogram is a procedure that uses painless sound waves (ultrasound) to produce an image of the heart.  Images from an echocardiogram can provide important information about the size and shape of your heart, heart muscle function, heart valve function, and fluid buildup around your heart.  You do not need to do anything to prepare before this procedure. You may eat and drink normally.  After the echocardiogram is completed, you may return to your normal, everyday life, unless your health care provider tells you not to do that. This information is not intended to replace advice given to you by your health care provider. Make sure you discuss any questions you have with your health care provider. Document Revised: 11/27/2018 Document Reviewed: 09/08/2016 Elsevier Patient Education  [place].

## 2019-11-30 NOTE — Progress Notes (Signed)
Cardiology Office Note:    Date:  11/30/2019   ID:  Donna Ferguson, Donna Ferguson [birth year], MRN [id number]  PCP:  Burnard Hawthorne, FNP  Cardiologist:  Kate Sable, MD  Electrophysiologist:  None   Referring MD: Burnard Hawthorne, FNP   Chief Complaint  Patient presents with  . New Patient (Initial Visit)    Referred by PCP for Palpitations. Meds reviewed verbally with patient.     History of Present Illness:    Donna Ferguson is a 79 y.o. female with a hx of hypertension, knee arthritis being seen for preop evaluation prior to right knee surgery.  Patient having history of arthritis, had left knee surgery in 2017.  She was planned to have a right knee surgery April of last year but this was postponed due to the onset of COVID-19 pandemic.  Prior to her scheduled procedure, she underwent a stress test back in [place] by her prior cardiologist.  Stress echocardiogram showed no significant wall motion abnormalities.  He did mention some mitral insufficiency.  Patient was cleared to have surgery at the time but postponed due to the pandemic.  Her husband was recently diagnosed with prostate cancer and chemotherapy is being planned.  She has been worried of late, with palpitations occurring 2 weeks ago lasting couple of seconds.  Her blood pressure was also elevated at the time.  She presented to the emergency room where work-up was unrevealing.  She subsequently followed up with her primary care physician where her blood pressure medications were adjusted.  She currently just denies chest pain or shortness of breath at rest or with exertion.  Past Medical History:  Diagnosis Date  . Heart murmur   . History of colonoscopy    2019 in [place]; patient states no longer screening for colon cancer.  . Hypertension   . IBS (irritable bowel syndrome)     Past Surgical History:  Procedure Laterality Date  . ABDOMINAL HYSTERECTOMY    . cyst N/A    cyst removal both breast  . REPLACEMENT TOTAL  KNEE  2017    Current Medications: Current Meds  Medication Sig  . amLODipine (NORVASC) 5 MG tablet Take 1 tablet (5 mg total) by mouth every evening.  . Ascorbic Acid (VITAMIN C) 1000 MG tablet Take 1,000 mg by mouth daily.  . hydrochlorothiazide (MICROZIDE) 12.5 MG capsule Take 1 capsule (12.5 mg total) by mouth daily.  . metoprolol succinate (TOPROL-XL) 50 MG 24 hr tablet Take 1 tablet (50 mg total) by mouth daily. Take with or immediately following a meal.     Allergies:   Patient has no known allergies.   Social History   Socioeconomic History  . Marital status: Married    Spouse name: Not on file  . Number of children: 2  . Years of education: Not on file  . Highest education level: Not on file  Occupational History  . Not on file  Tobacco Use  . Smoking status: Never Smoker  . Smokeless tobacco: Never Used  Substance and Sexual Activity  . Alcohol use: Never  . Drug use: Never  . Sexual activity: Not on file  Other Topics Concern  . Not on file  [employer] in [place]- Retired   2 children son & daughter   Married   From [place]   Retired 2011.    Enjoys painting   Social Determinants of Radio broadcast assistant Strain:   .  Difficulty of Paying Living Expenses:   Food Insecurity:   . Worried About Charity fundraiser in the Last Year:   . Arboriculturist in the Last Year:   Transportation Needs:   . Film/video editor (Medical):   Marland Kitchen Lack of Transportation (Non-Medical):   Physical Activity:   . Days of Exercise per Week:   . Minutes of Exercise per Session:   Stress:   . Feeling of Stress :   Social Connections:   . Frequency of Communication with Friends and Family:   . Frequency of Social Gatherings with Friends and Family:   . Attends Religious Services:   . Active Member of Clubs or Organizations:   . Attends Archivist Meetings:   Marland Kitchen Marital Status:      Family  History: The patient's family history includes Diabetes in her father; Heart disease in her father; Hypertension in her daughter, father, mother, and sister.  ROS:   Please see the history of present illness.     All other systems reviewed and are negative.   EKGs/Labs/Other Studies Reviewed:    The following studies were reviewed today:   EKG:  EKG is  ordered today.  The ekg ordered today demonstrates sinus rhythm, normal ECG.  Recent Labs: 05/04/2019: TSH 0.96 11/07/2019: ALT 15; Hemoglobin 14.0; Platelets 289 11/11/2019: BUN 21; Creatinine, Ser 1.28; Magnesium 1.9; Potassium 4.1; Sodium 133  Recent Lipid Panel    Component Value Date/Time   CHOL 165 05/04/2019 0858   TRIG 102.0 05/04/2019 0858   HDL 55.20 05/04/2019 0858   CHOLHDL 3 05/04/2019 0858   VLDL 20.4 05/04/2019 0858   LDLCALC 90 05/04/2019 0858    Physical Exam:    VS:  BP 126/70 (BP Location: Right Arm, Patient Position: Sitting, Cuff Size: Normal)   Pulse 70   Ht 5\' 3"  (1.6 m)   Wt 154 lb (69.9 kg)   SpO2 97%   BMI 27.28 kg/m     Wt Readings from Last 3 Encounters:  11/30/19 154 lb (69.9 kg)  11/11/19 152 lb (68.9 kg)  11/07/19 152 lb (68.9 kg)     GEN:  Well nourished, well developed in no acute distress HEENT: Normal NECK: No JVD; No carotid bruits LYMPHATICS: No lymphadenopathy CARDIAC: RRR, no murmurs, rubs, gallops RESPIRATORY:  Clear to auscultation without rales, wheezing or rhonchi  ABDOMEN: Soft, non-tender, non-distended MUSCULOSKELETAL:  No edema; No deformity  SKIN: Warm and dry NEUROLOGIC:  Alert and oriented x 3 PSYCHIATRIC:  Normal affect   ASSESSMENT:    1. Pre-op evaluation   2. Mitral valve insufficiency, unspecified etiology   3. Essential hypertension    PLAN:    In order of problems listed above:  1. Right knee surgery is being planned.  Her RCRI is 0.  Patient therefore has a low cardiac risk for procedure.  Recent stress echocardiogram with no wall motion  abnormalities.  Patient is asymptomatic.  She can proceed with surgery from a cardiac perspective with no further cardiac intervention or testing. 2. History of mitral valve insufficiency likely mild.  Will get echocardiogram in the next several weeks to evaluate degree of valvular dysfunction if any. 3. History of hypertension.  Blood pressure adequately controlled.  Continue current BP meds.  Follow-up after echocardiogram in 3 months.  This note was generated in part or whole with voice recognition software. Voice recognition is usually quite accurate but there are transcription errors that can and very often do  occur. I apologize for any typographical errors that were not detected and corrected.  Medication Adjustments/Labs and Tests Ordered: Current medicines are reviewed at length with the patient today.  Concerns regarding medicines are outlined above.  Orders Placed This Encounter  Procedures  . EKG 12-Lead  . ECHOCARDIOGRAM COMPLETE   No orders of the defined types were placed in this encounter.   Patient Instructions  Medication Instructions:  Your physician recommends that you continue on your current medications as directed. Please refer to the Current Medication list given to you today.  *If you need a refill on your cardiac medications before your next appointment, please call your pharmacy*   Lab Work: None ordered If you have labs (blood work) drawn today and your tests are completely normal, you will receive your results only by: Marland Kitchen MyChart Message (if you have MyChart) OR . A paper copy in the mail If you have any lab test that is abnormal or we need to change your treatment, we will call you to review the results.   Testing/Procedures: Your physician has requested that you have an echocardiogram. Echocardiography is a painless test that uses sound waves to create images of your heart. It provides your doctor with information about the size and shape of your heart  and how well your heart's chambers and valves are working. This procedure takes approximately one hour. There are no restrictions for this procedure.     Follow-Up: At [facility], you and your health needs are our priority.  As part of our continuing mission to provide you with exceptional heart care, we have created designated Provider Care Teams.  These Care Teams include your primary Cardiologist (physician) and Advanced Practice Providers (APPs -  Physician Assistants and Nurse Practitioners) who all work together to provide you with the care you need, when you need it.  We recommend signing up for the patient portal called "MyChart".  Sign up information is provided on this After Visit Summary.  MyChart is used to connect with patients for Virtual Visits (Telemedicine).  Patients are able to view lab/test results, encounter notes, upcoming appointments, etc.  Non-urgent messages can be sent to your provider as well.   To learn more about what you can do with MyChart, go to [email or web address].    Your next appointment:   3 month(s)  The format for your next appointment:   In Person  Provider:    You may see Kate Sable, MD or one of the following Advanced Practice Providers on your designated Care Team:    Murray Hodgkins, NP  Christell Faith, PA-C  Marrianne Mood, PA-C    Other Instructions  Echocardiogram An echocardiogram is a procedure that uses painless sound waves (ultrasound) to produce an image of the heart. Images from an echocardiogram can provide important information about:  Signs of coronary artery disease (CAD).  Aneurysm detection. An aneurysm is a weak or damaged part of an artery wall that bulges out from the normal force of blood pumping through the body.  Heart size and shape. Changes in the size or shape of the heart can be associated with certain conditions, including heart failure, aneurysm, and CAD.  Heart muscle function.  Heart valve  function.  Signs of a past heart attack.  Fluid buildup around the heart.  Thickening of the heart muscle.  A tumor or infectious growth around the heart valves. Tell a health care provider about:  Any allergies you have.  All medicines you are taking,  including vitamins, herbs, eye drops, creams, and over-the-counter medicines.  Any blood disorders you have.  Any surgeries you have had.  Any medical conditions you have.  Whether you are pregnant or may be pregnant. What are the risks? Generally, this is a safe procedure. However, problems may occur, including:  Allergic reaction to dye (contrast) that may be used during the procedure. What happens before the procedure? No specific preparation is needed. You may eat and drink normally. What happens during the procedure?   An IV tube may be inserted into one of your veins.  You may receive contrast through this tube. A contrast is an injection that improves the quality of the pictures from your heart.  A gel will be applied to your chest.  A wand-like tool (transducer) will be moved over your chest. The gel will help to transmit the sound waves from the transducer.  The sound waves will harmlessly bounce off of your heart to allow the heart images to be captured in real-time motion. The images will be recorded on a computer. The procedure may vary among health care providers and hospitals. What happens after the procedure?  You may return to your normal, everyday life, including diet, activities, and medicines, unless your health care provider tells you not to do that. Summary  An echocardiogram is a procedure that uses painless sound waves (ultrasound) to produce an image of the heart.  Images from an echocardiogram can provide important information about the size and shape of your heart, heart muscle function, heart valve function, and fluid buildup around your heart.  You do not need to do anything to prepare  before this procedure. You may eat and drink normally.  After the echocardiogram is completed, you may return to your normal, everyday life, unless your health care provider tells you not to do that. This information is not intended to replace advice given to you by your health care provider. Make sure you discuss any questions you have with your health care provider. Document Revised: 11/27/2018 Document Reviewed: 09/08/2016 Elsevier Patient Education  2020 [place], Kate Sable, MD  11/30/2019 4:32 PM    [facility]

## 2019-12-04 ENCOUNTER — Other Ambulatory Visit (INDEPENDENT_AMBULATORY_CARE_PROVIDER_SITE_OTHER): Payer: Medicare Other

## 2019-12-04 ENCOUNTER — Other Ambulatory Visit: Payer: Self-pay

## 2019-12-04 DIAGNOSIS — R7303 Prediabetes: Secondary | ICD-10-CM

## 2019-12-04 DIAGNOSIS — I10 Essential (primary) hypertension: Secondary | ICD-10-CM

## 2019-12-04 DIAGNOSIS — R899 Unspecified abnormal finding in specimens from other organs, systems and tissues: Secondary | ICD-10-CM

## 2019-12-04 LAB — BASIC METABOLIC PANEL
BUN: 24 mg/dL — ABNORMAL HIGH (ref 6–23)
CO2: 30 mEq/L (ref 19–32)
Calcium: 9.7 mg/dL (ref 8.4–10.5)
Chloride: 101 mEq/L (ref 96–112)
Creatinine, Ser: 1.09 mg/dL (ref 0.40–1.20)
GFR: 58.86 mL/min — ABNORMAL LOW (ref >60.00)
Glucose, Bld: 99 mg/dL (ref 70–99)
Potassium: 3.9 mEq/L (ref 3.5–5.1)
Sodium: 138 mEq/L (ref 135–145)

## 2019-12-04 LAB — HEMOGLOBIN A1C: Hgb A1c MFr Bld: 6.2 % (ref 4.6–6.5)

## 2019-12-08 ENCOUNTER — Encounter: Payer: Self-pay | Admitting: Family

## 2019-12-08 ENCOUNTER — Other Ambulatory Visit: Payer: Self-pay

## 2019-12-08 ENCOUNTER — Ambulatory Visit (INDEPENDENT_AMBULATORY_CARE_PROVIDER_SITE_OTHER): Payer: Medicare Other | Admitting: Family

## 2019-12-08 VITALS — BP 124/80 | HR 68 | Temp 96.5°F | Ht 63.0 in | Wt 154.2 lb

## 2019-12-08 DIAGNOSIS — R1909 Other intra-abdominal and pelvic swelling, mass and lump: Secondary | ICD-10-CM

## 2019-12-08 DIAGNOSIS — I10 Essential (primary) hypertension: Secondary | ICD-10-CM | POA: Diagnosis not present

## 2019-12-08 DIAGNOSIS — R109 Unspecified abdominal pain: Secondary | ICD-10-CM | POA: Diagnosis not present

## 2019-12-08 NOTE — Progress Notes (Signed)
Subjective:    Patient ID: Donna Ferguson, female    DOB: [birth year], 79 y.o.   MRN: [id number]  CC: Donna Ferguson is a 79 y.o. female who presents today for follow up.   HPI: HTN- At home 120/ 61.  Feels well on new regimen. Recently lowered hydrochlorothiazide due to history of hyperkalemia, and increased amlodipine.  No cp, sob, leg swelling.  Abdominal pain , lower abdominal pain, has started again.Became more noticeable over the past month. Worse when sleeps on prone position and then gets up to move around. No pain when actually laying in prone position.  Sleeping on right side, pain resolves. NO pain with meals.    'years ago, had similar pain' and told it was r/t IBS by prior gastroenterology. States had images of abdomen 3-4 years ago in [place]. At that time, was given a prn medication that dissolved under tongue with resolution of symptoms. Tried linzess without improvement. H/o lumbar back pain however no back pain at this time.  Pain resolves with walking. Stool is hard and endorses straining ,bloating of abdomen.   Last bowel movement yesterday, loose brown stool.  However takes miralax 2-3 x per week.  No unusual weight loss, fever, epigastric burning, regurgitation, diarrhea, bleeding from the rectum or blood in stool. No h/o diverticulitis, GERD.  Last colonoscopy in [place] approx 3 years ago ( no records of this) , and told no further screening preop evaluation right knee TKR with Dr Charlestine Night H/o hysterectomy.  No problem with IV constrast. No recent IV contrast Crt 1.09 ( improved)  HISTORY:  Past Medical History:  Diagnosis Date  . Heart murmur   . History of colonoscopy    2019 in [place]; patient states no longer screening for colon cancer.  . Hypertension   . IBS (irritable bowel syndrome)    Past Surgical History:  Procedure Laterality Date  . ABDOMINAL HYSTERECTOMY     unsure if has ovaries.   . cyst N/A    cyst removal both breast  . REPLACEMENT TOTAL KNEE  2017    Family History  Problem Relation Age of Onset  . Hypertension Mother   . Diabetes Father   . Hypertension Father   . Heart disease Father   . Hypertension Sister   . Hypertension Daughter     Allergies: Patient has no known allergies. Current Outpatient Medications on File Prior to Visit  Medication Sig Dispense Refill  . amLODipine (NORVASC) 5 MG tablet Take 1 tablet (5 mg total) by mouth every evening. 90 tablet 1  . Ascorbic Acid (VITAMIN C) 1000 MG tablet Take 1,000 mg by mouth daily.    . hydrochlorothiazide (MICROZIDE) 12.5 MG capsule Take 1 capsule (12.5 mg total) by mouth daily. 90 capsule 0  . metoprolol succinate (TOPROL-XL) 50 MG 24 hr tablet Take 1 tablet (50 mg total) by mouth daily. Take with or immediately following a meal. 30 tablet 0   No current facility-administered medications on file prior to visit.    Social History   Tobacco Use  . Smoking status: Never Smoker  . Smokeless tobacco: Never Used  Substance Use Topics  . Alcohol use: Never  . Drug use: Never    Review of Systems  Constitutional: Negative for chills and fever.  Respiratory: Negative for cough.   Cardiovascular: Negative for chest pain and palpitations.  Gastrointestinal: Positive for abdominal distention, abdominal pain and constipation. Negative for blood in stool, nausea, rectal pain and vomiting.  Genitourinary: Negative for  difficulty urinating.  Musculoskeletal: Negative for back pain.  Neurological: Negative for numbness.      Objective:    BP 124/80   Pulse 68   Temp (!) 96.5 F (35.8 C) (Temporal)   Ht 5\' 3"  (1.6 m)   Wt 154 lb 3.2 oz (69.9 kg)   SpO2 97%   BMI 27.32 kg/m  BP Readings from Last 3 Encounters:  12/08/19 124/80  11/30/19 126/70  11/11/19 126/80   Wt Readings from Last 3 Encounters:  12/08/19 154 lb 3.2 oz (69.9 kg)  11/30/19 154 lb (69.9 kg)  11/11/19 152 lb (68.9 kg)    Physical Exam Vitals reviewed.  Constitutional:      Appearance: Normal  appearance. She is well-developed.  Eyes:     Conjunctiva/sclera: Conjunctivae normal.  Cardiovascular:     Rate and Rhythm: Normal rate and regular rhythm.     Pulses: Normal pulses.     Heart sounds: Normal heart sounds.  Pulmonary:     Effort: Pulmonary effort is normal.     Breath sounds: Normal breath sounds. No wheezing, rhonchi or rales.  Abdominal:     General: Bowel sounds are normal. There is no distension.     Palpations: Abdomen is soft. Abdomen is not rigid. There is no fluid wave or mass.     Tenderness: There is abdominal tenderness in the right lower quadrant and left lower quadrant. There is no guarding or rebound.     Comments: Diffuse tenderness noted with deep palpation of lower abdomen bilateral.  Pain is not exquisite more focal.No distention appreciated. No guarding.  Musculoskeletal:     Lumbar back: No swelling, edema, spasms, tenderness or bony tenderness. Normal range of motion.     Comments: Full range of motion with flexion, tension, lateral side bends. No bony tenderness. No pain, numbness, tingling elicited with single leg raise bilaterally.   Skin:    General: Skin is warm and dry.  Neurological:     Mental Status: She is alert.     Sensory: No sensory deficit.     Deep Tendon Reflexes:     Reflex Scores:      Patellar reflexes are 2+ on the right side and 2+ on the left side.    Comments: Sensation and strength intact bilateral lower extremities.  Psychiatric:        Speech: Speech normal.        Behavior: Behavior normal.        Thought Content: Thought content normal.        Assessment & Plan:   Problem List Items Addressed This Visit      Cardiovascular and Mediastinum   HTN (hypertension)    Stable, controlled. Continue regimen.         Other   Abdominal pain - Primary    Patient is well-appearing today, she is nontoxic in appearance.  Differentials include constipation, ovarian pathology, MS etiology from lumbar back.of note  presentation atypical as it appears to be exacerbated by movement thus discussed potential musculoskeletal etiology with patient if work-up returns normal .  pending CT Abdomen and pelvis, CA 125.       Relevant Orders   CT ABDOMEN PELVIS W CONTRAST   CA 125    Other Visit Diagnoses    Other intra-abdominal and pelvic swelling, mass and lump        Relevant Orders   CA 125       I am having Nyeema Schou maintain her  metoprolol succinate, vitamin C, hydrochlorothiazide, and amLODipine.   No orders of the defined types were placed in this encounter.   Return precautions given.   Risks, benefits, and alternatives of the medications and treatment plan prescribed today were discussed, and patient expressed understanding.   Education regarding symptom management and diagnosis given to patient on AVS.  Continue to follow with Burnard Hawthorne, FNP for routine health maintenance.   Lucciana Tracz and I agreed with plan.   [name], FNP

## 2019-12-08 NOTE — Assessment & Plan Note (Signed)
Stable, controlled. Continue regimen.

## 2019-12-08 NOTE — Patient Instructions (Addendum)
It is imperative that you are seen AT least twice per year for labs and monitoring. Monitor blood pressure at home and me 5-6 reading on separate days. Goal is less than 120/80, based on newest guidelines, however we certainly want to be less than 130/80;  if persistently higher, please make sooner follow up appointment so we can recheck you blood pressure and manage/ adjust medications.  Please send me records of colonoscopy report.   I have ordered CT abdomen and pelvis. Let us know if you dont hear back within a week in regards to an appointment being scheduled.   Increase fluids, continue MiraLAX.  The goal is had a bowel movement every other day at the very least.   Close follow up.

## 2019-12-08 NOTE — Assessment & Plan Note (Addendum)
Patient is well-appearing today, she is nontoxic in appearance.  Differentials include constipation, ovarian pathology, MS etiology from lumbar back.of note presentation atypical as it appears to be exacerbated by movement thus discussed potential musculoskeletal etiology with patient if work-up returns normal .  pending CT Abdomen and pelvis, CA 125.

## 2019-12-09 LAB — CA 125: CA 125: 8 U/mL (ref <35)

## 2019-12-10 ENCOUNTER — Telehealth: Payer: Self-pay | Admitting: Family

## 2019-12-10 ENCOUNTER — Other Ambulatory Visit: Payer: Self-pay

## 2019-12-10 DIAGNOSIS — R300 Dysuria: Secondary | ICD-10-CM

## 2019-12-10 DIAGNOSIS — R109 Unspecified abdominal pain: Secondary | ICD-10-CM

## 2019-12-10 NOTE — Telephone Encounter (Signed)
Patient will come in tomorrow to leave urine. Patient was advised if any UTI symptoms or worsening symptoms to please go to UC over the weekend. Pt verbalized understanding.

## 2019-12-10 NOTE — Telephone Encounter (Signed)
I spoke with pt husband to have pt call ofc to sch CT. Husband understood.

## 2019-12-10 NOTE — Telephone Encounter (Signed)
Patient would like to have a urine test done before going through a CT scan.

## 2019-12-10 NOTE — Telephone Encounter (Signed)
Is okay for patient to have urine checked before proceeding with CT?

## 2019-12-10 NOTE — Telephone Encounter (Signed)
Yes that's fine if you can order UA and urine culture. She denied dysuria during visit.   Advise pt Certainly if any new or worsening symptoms since I'm out of office , she would need to go to UC today or over weekend. It may take a couple of days to get urine studies back.

## 2019-12-11 ENCOUNTER — Other Ambulatory Visit (INDEPENDENT_AMBULATORY_CARE_PROVIDER_SITE_OTHER): Payer: Medicare Other

## 2019-12-11 ENCOUNTER — Other Ambulatory Visit: Payer: Self-pay

## 2019-12-11 DIAGNOSIS — R300 Dysuria: Secondary | ICD-10-CM

## 2019-12-11 LAB — POCT URINALYSIS DIPSTICK
Bilirubin, UA: NEGATIVE
Blood, UA: NEGATIVE
Glucose, UA: NEGATIVE
Nitrite, UA: NEGATIVE
Protein, UA: NEGATIVE
Spec Grav, UA: 1.02 (ref 1.010–1.025)
Urobilinogen, UA: 0.2 E.U./dL
pH, UA: 7 (ref 5.0–8.0)

## 2019-12-12 LAB — URINE CULTURE
MICRO NUMBER:: 10399472
Result:: NO GROWTH
SPECIMEN QUALITY:: ADEQUATE

## 2019-12-14 NOTE — Telephone Encounter (Signed)
Call pt Appears she was seen in urgent care for UTI over the weekend and was given Keflex.  From her urine ( see result note out for her) ,it does not appear she has an infection nor would need antibiotics for UTI.    If she would like to be reevaluated here or has new symptoms, abdominal pain, please make an appointment with [place] and we can also reorder urine.   I would strongy advise that she proceed with CT abdomen and pelvis , appears scheduled 12/22/19.  Does she feel I need to move to STAT ? I would advise this if any abdominal pain, fever, diarrhea.

## 2019-12-14 NOTE — Telephone Encounter (Signed)
Patient stated that she hadn't had the pain & pressure as bad since being on the antibiotic. Patent would prefer to stay on them. When dip was done here & at UC she stated that something was seen (I am assuming the small amount of leukocytes). She really wants CT order WITH OUT contrast due to all the radiation she said. Can this be done?   She was advised if new or worsening symptoms to let us know & we could reevaluate or do a STAT CT.

## 2019-12-14 NOTE — Telephone Encounter (Signed)
[place] Night - Cl TELEPHONE ADVICE RECORD Access[employer]urse Patient Name: Donna Ferguson [id number] Gender: Female DOB: 11-06-1940 Age: 79 Y 3 M 8 D Return Phone Number: [phone] (Primary) Address: City/State/Zip: [name] [place] [address] Client [place] Client Site [place] Physician [name]- NP Contact Type Call Who Is Calling Patient / Member / Family / Caregiver Call Type Triage / Clinical Relationship To Patient Self Return Phone Number [phone] (Primary) Chief Complaint Urination Pain Reason for Call Symptomatic / Request for [place] states that she thinks she has UTI and would like something called in to [facility] [phone]. Translation No Nurse Assessment Nurse: Self, RN, Nira Conn Date/Time (Eastern Time): 12/11/2019 7:16:58 PM Confirm and document reason for call. If symptomatic, describe symptoms. ---Caller says she went into the office dropped off urine sample and was told she was positive for UTI. Caller was instructed unable to call in Antibiotics per directive. Caller was instructed to follow up at [facility]. Caller declined triage. Has the patient had close contact with a person known or suspected to have the novel coronavirus illness OR traveled / lives in area with major community spread (including international travel) in the last 14 days from the onset of symptoms? * If Asymptomatic, screen for exposure and travel within the last 14 days. ---No Does the patient have any new or worsening symptoms? ---No Guidelines Guideline Title Affirmed Question Affirmed Notes Nurse Date/Time (Eastern Time) Disp. Time Eilene Ghazi Time) Disposition Final User 12/11/2019 7:19:41 PM Clinical Call Yes Self, RN, Nira Conn

## 2019-12-14 NOTE — Telephone Encounter (Signed)
LM for patient to call back.

## 2019-12-15 NOTE — Telephone Encounter (Signed)
Patient stated that since Keflex she feels much better. She is not having to hold her abdominal at night when she gets up to use the bathroom. She said that since she a few days before CT that she will make sure that pain does not reoccur. If so then she will keep appointment for CT if not then she will cancel. She would like to play by ear & will let us know if she wants to consult GI.

## 2019-12-15 NOTE — Telephone Encounter (Signed)
Call pt Please clarify any confusion.  My advice is that she not appear to have active infection so she could stop the Keflex however if she feels like this is been very helpful for her, she may continue medication.   How are her symptoms? If no abdominal pain, fever, bloating, I certainly dont want to order image if not necessary. Would she like to consult GI and hold on imaging?

## 2019-12-15 NOTE — Telephone Encounter (Signed)
Pt is wanting a call back before you leave today. Pt is concerned about the medication she is supposed to be taking and the medication you asked her to stop taking.

## 2019-12-16 NOTE — Telephone Encounter (Signed)
LMTCB to advise on below.

## 2019-12-16 NOTE — Telephone Encounter (Signed)
Donna Ferguson  Call pt That sounds reasonable.  Certainly if abdominal pain were to recur, she needs to let us know and may need earlier imaging  Advise that I changed CT abdomen to WITHOUT IV contrast; she may received ORAL contrast of radiology says appropriate. When she arrives for image, she needs to also tell them of her h/o decreased kidney function which I have notated on order as well  Donna Ferguson, can we change CT Ab to WITHOUT contrast? It is scheduled for 12/22/19

## 2019-12-16 NOTE — Telephone Encounter (Signed)
I spoke with patient & she was all on board with plan. She was advised on below & will play by ear.

## 2019-12-17 ENCOUNTER — Ambulatory Visit
Admission: RE | Admit: 2019-12-17 | Discharge: 2019-12-17 | Disposition: A | Discharge status: Home / Self Care | Payer: Medicare Other | Source: Ambulatory Visit | Attending: Family | Admitting: Family

## 2019-12-17 ENCOUNTER — Telehealth: Payer: Self-pay | Admitting: Family

## 2019-12-17 DIAGNOSIS — Z1231 Encounter for screening mammogram for malignant neoplasm of breast: Secondary | ICD-10-CM | POA: Diagnosis not present

## 2019-12-17 NOTE — Telephone Encounter (Signed)
YW! °

## 2019-12-17 NOTE — Telephone Encounter (Signed)
Appt was changed to with out.

## 2019-12-17 NOTE — Telephone Encounter (Signed)
Awesome !! TY

## 2019-12-17 NOTE — Telephone Encounter (Signed)
I left vm about pt ct was changed to with out same appt and time and location stayed the same.

## 2019-12-18 ENCOUNTER — Encounter: Payer: Self-pay | Admitting: Family

## 2019-12-21 ENCOUNTER — Other Ambulatory Visit: Payer: Self-pay | Admitting: Surgery

## 2019-12-22 ENCOUNTER — Ambulatory Visit
Admission: RE | Admit: 2019-12-22 | Discharge: 2019-12-22 | Disposition: A | Discharge status: Home / Self Care | Payer: Medicare Other | Source: Ambulatory Visit | Attending: Family | Admitting: Family

## 2019-12-22 ENCOUNTER — Other Ambulatory Visit: Payer: Self-pay

## 2019-12-22 DIAGNOSIS — R109 Unspecified abdominal pain: Secondary | ICD-10-CM | POA: Diagnosis present

## 2019-12-24 ENCOUNTER — Ambulatory Visit
Admission: RE | Admit: 2019-12-24 | Discharge: 2019-12-24 | Disposition: A | Discharge status: Home / Self Care | Payer: Medicare Other | Source: Ambulatory Visit | Attending: Family | Admitting: Family

## 2019-12-24 DIAGNOSIS — Z1231 Encounter for screening mammogram for malignant neoplasm of breast: Secondary | ICD-10-CM | POA: Insufficient documentation

## 2019-12-24 DIAGNOSIS — Z780 Asymptomatic menopausal state: Secondary | ICD-10-CM | POA: Insufficient documentation

## 2019-12-25 ENCOUNTER — Other Ambulatory Visit: Payer: Self-pay

## 2019-12-25 ENCOUNTER — Encounter
Admission: RE | Admit: 2019-12-25 | Discharge: 2019-12-25 | Disposition: A | Discharge status: Home / Self Care | Payer: Medicare Other | Source: Ambulatory Visit | Attending: Surgery | Admitting: Surgery

## 2019-12-25 DIAGNOSIS — Z01812 Encounter for preprocedural laboratory examination: Secondary | ICD-10-CM | POA: Insufficient documentation

## 2019-12-25 HISTORY — DX: Prediabetes: R73.03

## 2019-12-25 HISTORY — DX: Chronic kidney disease, unspecified: N18.9

## 2019-12-25 HISTORY — DX: Unspecified osteoarthritis, unspecified site: M19.90

## 2019-12-25 NOTE — Patient Instructions (Signed)
Your procedure is scheduled on: Tues 5/18 Report to Day [facility]. [facility] To find out your arrival time please call [phone] between [place] on Mon. 5/17 .  Remember: Instructions that are not followed completely may result in serious medical risk,  up to and including death, or upon the discretion of your surgeon and anesthesiologist your  surgery may need to be rescheduled.     _X__ 1. Do not eat food after midnight the night before your procedure.                 No gum chewing or hard candies. You may drink clear liquids up to 2 hours                 before you are scheduled to arrive for your surgery- DO not drink clear                 liquids within 2 hours of the start of your surgery.                 Clear Liquids include:  water, apple juice without pulp, clear Gatorade, G2 or                  Gatorade Zero (avoid Red/Purple/Blue), Black Coffee or Tea (Do not add                 anything to coffee or tea). ___x__2.   Complete the carbohydrate drink provided to you, 2 hours before arrival.  __X__2.  On the morning of surgery brush your teeth with toothpaste and water, you                may rinse your mouth with mouthwash if you wish.  Do not swallow any toothpaste of mouthwash.     ___ 3.  No Alcohol for 24 hours before or after surgery.   ___ 4.  Do Not Smoke or use e-cigarettes For 24 Hours Prior to Your Surgery.                 Do not use any chewable tobacco products for at least 6 hours prior to                 Surgery.  _X__  5.  Do not use any recreational drugs (marijuana, cocaine, heroin, ecstacy, MDMA or other)                For at least one week prior to your surgery.  Combination of these drugs with anesthesia                May have life threatening results.  ____  6.  Bring all medications with you on the day of surgery if instructed.   __x__  7.  Notify your doctor if there is any change in your medical condition      (cold,  fever, infections).     Do not wear jewelry, make-up, hairpins, clips or nail polish. Do not wear lotions, powders, or perfumes. You may wear deodorant. Do not shave 48 hours prior to surgery.  Do not bring valuables to the hospital.    [facility] is not responsible for any belongings or valuables.  Contacts, dentures or bridgework may not be worn into surgery. Leave your suitcase in the car. After surgery it may be brought to your room. For patients admitted to the hospital, discharge time is determined by your treatment team.  Patients discharged the day of surgery will not be allowed to drive home.   Make arrangements for someone to be with you for the first 24 hours of your Same Day Discharge.    Please read over the following fact sheets that you were given:   __x__ Take these medicines the morning of surgery with A SIP OF WATER:    1. metoprolol succinate (TOPROL-XL) 50 MG 24 hr tablet  2.   3.   4.  5.  6.  ____ Fleet Enema (as directed)   _x___ Use CHG Soap (or wipes) as directed  ____ Use Benzoyl Peroxide Gel as instructed  ____ Use inhalers on the day of surgery  ____ Stop metformin 2 days prior to surgery    ____ Take 1/2 of usual insulin dose the night before surgery. No insulin the morning          of surgery.   ____ Stop Coumadin/Plavix/aspirin on   _x___ Stop Anti-inflammatories No ibuprofen aleve or aspirin    May take tylenol   __x__ Stop supplements  Ascorbic Acid (VITAMIN C) 1000 MG tablet on 5/11 .    ____ Bring C-Pap to the hospital.

## 2019-12-29 ENCOUNTER — Telehealth: Payer: Self-pay | Admitting: Family

## 2019-12-29 NOTE — Telephone Encounter (Signed)
FYI I spoke with patient again & she was able to get an appointment at [facility] dentistry on Thursday at 3p. She stated that she was in no pain, but if she had concerns prior to call us back. I advised that Maudie Mercury did have openings tomorrow. She thought that she would be okay seeing the dentist Thursday & then [place] on Friday.

## 2019-12-29 NOTE — Telephone Encounter (Signed)
Any advise? Patient is scheduled for Friday to come in at 8am. This was to discuss CT though I believe.

## 2019-12-29 NOTE — Telephone Encounter (Signed)
Patient is going to call dentist that I recommended to see if they can see her. If not she will call back & I can try to get her in with possibly Maudie Mercury tomorrow. Pt didn't want to go to [facility].

## 2019-12-29 NOTE — Telephone Encounter (Signed)
For concern for dental infection, I would first advise her to call her dentist if she established with one..    If not, she would need to go to urgent care.  Unfortunately dental infections can be quite serious in nature and she would need in person evaluation today

## 2019-12-29 NOTE — Telephone Encounter (Signed)
Pt called in and said her tooth is broken and she is having surgery next week. She wants to know if there is a special mouthwash or antibiotic she can call in so she doesn't get an infection before surgery? She also wanted to move her appt from Monday to Friday. The only time she could do was 8am due to her having preadmission testing.

## 2019-12-31 ENCOUNTER — Ambulatory Visit
Admission: RE | Admit: 2019-12-31 | Discharge: 2019-12-31 | Disposition: A | Discharge status: Home / Self Care | Payer: Medicare Other | Source: Ambulatory Visit | Attending: Family | Admitting: Family

## 2019-12-31 DIAGNOSIS — Z780 Asymptomatic menopausal state: Secondary | ICD-10-CM | POA: Insufficient documentation

## 2019-12-31 DIAGNOSIS — Z1231 Encounter for screening mammogram for malignant neoplasm of breast: Secondary | ICD-10-CM | POA: Diagnosis present

## 2020-01-01 ENCOUNTER — Inpatient Hospital Stay: Admission: RE | Admit: 2020-01-01 | Payer: Medicare Other | Source: Ambulatory Visit

## 2020-01-01 ENCOUNTER — Ambulatory Visit (INDEPENDENT_AMBULATORY_CARE_PROVIDER_SITE_OTHER): Payer: Medicare Other | Admitting: Family

## 2020-01-01 ENCOUNTER — Other Ambulatory Visit: Payer: Self-pay

## 2020-01-01 ENCOUNTER — Telehealth: Payer: Self-pay | Admitting: Family

## 2020-01-01 ENCOUNTER — Other Ambulatory Visit: Payer: Medicare Other

## 2020-01-01 ENCOUNTER — Encounter: Payer: Self-pay | Admitting: Family

## 2020-01-01 VITALS — BP 110/62 | HR 65 | Temp 97.4°F | Ht 63.0 in | Wt 158.0 lb

## 2020-01-01 DIAGNOSIS — M545 Low back pain, unspecified: Secondary | ICD-10-CM | POA: Insufficient documentation

## 2020-01-01 DIAGNOSIS — K7689 Other specified diseases of liver: Secondary | ICD-10-CM | POA: Diagnosis not present

## 2020-01-01 DIAGNOSIS — N281 Cyst of kidney, acquired: Secondary | ICD-10-CM | POA: Diagnosis not present

## 2020-01-01 DIAGNOSIS — K5900 Constipation, unspecified: Secondary | ICD-10-CM | POA: Diagnosis not present

## 2020-01-01 NOTE — Assessment & Plan Note (Signed)
Pending [place]

## 2020-01-01 NOTE — Assessment & Plan Note (Signed)
Pending US

## 2020-01-01 NOTE — Telephone Encounter (Signed)
Left pt vm to call ofc to sch [place]

## 2020-01-01 NOTE — Assessment & Plan Note (Signed)
No back pain today.  degenerative changes noted on CT abdomen pelvis.  Jointly agreed arthritis may also be playing a role in her pain.  She will try over-the-counter and topical medications.  She will let me know further concerns so we can consult orthopedic

## 2020-01-01 NOTE — Patient Instructions (Addendum)
Ultrasound of kidney and liver ordered  Let us know if you dont hear back within a week in regards to an appointments being scheduled.   let me know if you would like to do cologuard to screen for colon cancer. Please call your insurance company and let me know if you would like me to order  Continue miralax and titrate to ensure 1-2 soft bowel movements per day.   IN regards to suspect back pain,  Let's treat conservatively as we discussed.   Over-the-counter medications you may try for arthritic pain include:   ThermaCare patches or SalonPas pain patch   Capsaicin cream   Icy hot or BioFreeze     HEAT HEAT HEAT.  If conservative treatment doesn't yield results, we will consider physical therapy, consult to Sports Medicine/Orthopedics for further evaluation, and imaging.   If there is no improvement in your symptoms, or if there is any worsening of symptoms, or if you have any additional concerns, please return for re-evaluation; or, if we are closed, consider going to the Emergency Room for evaluation if symptoms urgent.

## 2020-01-01 NOTE — Progress Notes (Signed)
Subjective:    Patient ID: Donna Ferguson, female    DOB: 09-Sep-1940, 79 y.o.   MRN: [id number]  CC: Donna Ferguson is a 79 y.o. female who presents today for follow up.   HPI: Abdominal pain continues left lower abdomen , had waxed and waned over the past several weeks. NO pain today. Describes having constipation for the last 10 years. Had complete resolution after treatment for uti. No abdominal pain when she sleeps on her back at night. Abdominal pain if sleeps supine.  Thinks constipation is related. Taking miralax daily or have prunes with soft bm daily.  Also complains of occasional low back Pain with movement such as bending over.      seen [facility] and treated for UTI.  Doesn't plan to do colonoscopy again. colonoscopy 2019 per patient. No unusual weight loss, night sweats.  No anemia.   HISTORY:  Past Medical History:  Diagnosis Date  . Arthritis   . Chronic kidney disease    cyst near kidney  . Heart murmur    past  . History of colonoscopy    2019 in [place]; patient states no longer screening for colon cancer.  . Hypertension   . IBS (irritable bowel syndrome)   . Pre-diabetes    Past Surgical History:  Procedure Laterality Date  . ABDOMINAL HYSTERECTOMY     unsure if has ovaries.   Marland Kitchen BREAST EXCISIONAL BIOPSY Bilateral late 80s, early 90s    benign  . cyst N/A    cyst removal both breast  . JOINT REPLACEMENT Left    knee  . REPLACEMENT TOTAL KNEE  2017   Family History  Problem Relation Age of Onset  . Hypertension Mother   . Diabetes Father   . Hypertension Father   . Heart disease Father   . Hypertension Sister   . Hypertension Daughter     Allergies: Patient has no known allergies. Current Outpatient Medications on File Prior to Visit  Medication Sig Dispense Refill  . amLODipine (NORVASC) 5 MG tablet Take 1 tablet (5 mg total) by mouth every evening. 90 tablet 1  . Ascorbic Acid (VITAMIN C) 1000 MG tablet Take 1,000 mg by mouth daily.    .  hydrochlorothiazide (MICROZIDE) 12.5 MG capsule Take 1 capsule (12.5 mg total) by mouth daily. 90 capsule 0  . metoprolol succinate (TOPROL-XL) 50 MG 24 hr tablet Take 1 tablet (50 mg total) by mouth daily. Take with or immediately following a meal. 30 tablet 0   No current facility-administered medications on file prior to visit.    Social History   Tobacco Use  . Smoking status: Never Smoker  . Smokeless tobacco: Never Used  Substance Use Topics  . Alcohol use: Never  . Drug use: Never    Review of Systems  Constitutional: Negative for appetite change, chills, fever and unexpected weight change.  Respiratory: Negative for cough.   Cardiovascular: Negative for chest pain and palpitations.  Gastrointestinal: Positive for constipation. Negative for abdominal distention, abdominal pain (no pain today), nausea and vomiting.  Genitourinary: Negative for urgency.  Musculoskeletal: Positive for back pain. Negative for arthralgias.      Objective:    BP 110/62   Pulse 65   Temp (!) 97.4 F (36.3 C) (Temporal)   Ht 5\' 3"  (1.6 m)   Wt 158 lb (71.7 kg)   SpO2 99%   BMI 27.99 kg/m  BP Readings from Last 3 Encounters:  01/01/20 110/62  12/08/19 124/80  11/30/19  126/70   Wt Readings from Last 3 Encounters:  01/01/20 158 lb (71.7 kg)  12/25/19 156 lb (70.8 kg)  12/08/19 154 lb 3.2 oz (69.9 kg)    Physical Exam Vitals reviewed.  Constitutional:      Appearance: Normal appearance. She is well-developed.  Eyes:     Conjunctiva/sclera: Conjunctivae normal.  Cardiovascular:     Rate and Rhythm: Normal rate and regular rhythm.     Pulses: Normal pulses.     Heart sounds: Normal heart sounds.  Pulmonary:     Effort: Pulmonary effort is normal.     Breath sounds: Normal breath sounds. No wheezing, rhonchi or rales.  Abdominal:     General: Bowel sounds are normal. There is no distension.     Palpations: Abdomen is soft. Abdomen is not rigid. There is no fluid wave or mass.      Tenderness: There is no abdominal tenderness. There is no guarding or rebound.  Skin:    General: Skin is warm and dry.  Neurological:     Mental Status: She is alert.  Psychiatric:        Speech: Speech normal.        Behavior: Behavior normal.        Thought Content: Thought content normal.        Assessment & Plan:   Problem List Items Addressed This Visit      Digestive   Liver cyst    Pending [place]       Relevant Orders   US ABDOMEN LIMITED RUQ     Genitourinary   Renal cyst    Pending [place]      Relevant Orders   US Renal     Other   Constipation - Primary    No abdominal pain today. Patient is well appearing. We agreed constipation likely contributory.  Patient well increase MiraLAX to 1 to twice a day in order to ensure at least 1-2 soft bowel movements a day.  She will decrease this if she develops diarrhea.  Advised colonoscopy . Patient declines colonoscopy at this time. She will let me if she will do cologuard and call her insurance company in this regard.        Low back pain    No back pain today.  degenerative changes noted on CT abdomen pelvis.  Jointly agreed arthritis may also be playing a role in her pain.  She will try over-the-counter and topical medications.  She will let me know further concerns so we can consult orthopedic          I am having Donna Ferguson maintain her metoprolol succinate, vitamin C, hydrochlorothiazide, and amLODipine.   No orders of the defined types were placed in this encounter.   Return precautions given.   Risks, benefits, and alternatives of the medications and treatment plan prescribed today were discussed, and patient expressed understanding.   Education regarding symptom management and diagnosis given to patient on AVS.  Continue to follow with Donna Hawthorne, FNP for routine health maintenance.   Donna Ferguson and I agreed with plan.   [name], FNP

## 2020-01-01 NOTE — Assessment & Plan Note (Addendum)
No abdominal pain today. Patient is well appearing. We agreed constipation likely contributory.  Patient well increase MiraLAX to 1 to twice a day in order to ensure at least 1-2 soft bowel movements a day.  She will decrease this if she develops diarrhea.  Advised colonoscopy . Patient declines colonoscopy at this time. She will let me if she will do cologuard and call her insurance company in this regard.

## 2020-01-04 ENCOUNTER — Other Ambulatory Visit: Payer: Self-pay

## 2020-01-04 ENCOUNTER — Ambulatory Visit: Payer: Medicare Other | Admitting: Family

## 2020-01-04 ENCOUNTER — Ambulatory Visit (INDEPENDENT_AMBULATORY_CARE_PROVIDER_SITE_OTHER): Payer: Medicare Other

## 2020-01-04 DIAGNOSIS — Z01818 Encounter for other preprocedural examination: Secondary | ICD-10-CM | POA: Diagnosis not present

## 2020-01-04 DIAGNOSIS — I340 Nonrheumatic mitral (valve) insufficiency: Secondary | ICD-10-CM | POA: Diagnosis not present

## 2020-01-04 DIAGNOSIS — I5189 Other ill-defined heart diseases: Secondary | ICD-10-CM

## 2020-01-04 HISTORY — DX: Other ill-defined heart diseases: I51.89

## 2020-01-04 NOTE — Telephone Encounter (Signed)
I spoke with pt. Thank you!

## 2020-01-04 NOTE — Telephone Encounter (Signed)
Pt called back returning your call °

## 2020-01-05 ENCOUNTER — Encounter: Admission: RE | Payer: Self-pay | Source: Home / Self Care

## 2020-01-05 ENCOUNTER — Inpatient Hospital Stay: Admission: RE | Admit: 2020-01-05 | Payer: Medicare Other | Source: Home / Self Care | Admitting: Surgery

## 2020-01-05 SURGERY — ARTHROPLASTY, KNEE, TOTAL
Anesthesia: Choice | Site: Knee | Laterality: Right

## 2020-01-12 ENCOUNTER — Other Ambulatory Visit: Payer: Self-pay

## 2020-01-12 ENCOUNTER — Ambulatory Visit
Admission: RE | Admit: 2020-01-12 | Discharge: 2020-01-12 | Disposition: A | Discharge status: Home / Self Care | Payer: Medicare Other | Source: Ambulatory Visit | Attending: Family | Admitting: Family

## 2020-01-12 DIAGNOSIS — K7689 Other specified diseases of liver: Secondary | ICD-10-CM | POA: Diagnosis present

## 2020-01-13 ENCOUNTER — Ambulatory Visit (INDEPENDENT_AMBULATORY_CARE_PROVIDER_SITE_OTHER): Payer: Medicare Other | Admitting: Family

## 2020-01-13 ENCOUNTER — Encounter: Payer: Self-pay | Admitting: Family

## 2020-01-13 ENCOUNTER — Other Ambulatory Visit: Payer: Self-pay | Admitting: Family

## 2020-01-13 VITALS — BP 130/82 | HR 64 | Temp 97.8°F | Resp 15 | Ht 63.0 in | Wt 156.4 lb

## 2020-01-13 DIAGNOSIS — K5900 Constipation, unspecified: Secondary | ICD-10-CM | POA: Diagnosis not present

## 2020-01-13 DIAGNOSIS — L989 Disorder of the skin and subcutaneous tissue, unspecified: Secondary | ICD-10-CM | POA: Insufficient documentation

## 2020-01-13 DIAGNOSIS — K7689 Other specified diseases of liver: Secondary | ICD-10-CM

## 2020-01-13 DIAGNOSIS — R21 Rash and other nonspecific skin eruption: Secondary | ICD-10-CM | POA: Insufficient documentation

## 2020-01-13 MED ORDER — DOXYCYCLINE HYCLATE 100 MG PO TABS: 100.0000 mg | ORAL_TABLET | Freq: Two times a day (BID) | ORAL | 0 refills | Status: AC

## 2020-01-13 MED ORDER — DOXYCYCLINE HYCLATE 100 MG PO TBEC: 100.0000 mg | DELAYED_RELEASE_TABLET | Freq: Two times a day (BID) | ORAL | 0 refills | Status: DC

## 2020-01-13 NOTE — Assessment & Plan Note (Addendum)
Improving. Discussed differentials including arthropod, break in skin for excoriation, early abscess as result of break in skin,  and also less commonly fungal infection from rose thorn. Due to fluctuance, I have higher suspicion of infection and we agreed today to treat with MRSA covering agent, doxycycline. We discussed itraconazole if infection with sporotrichosis however per literature review of wound presentation,  I do not have a high level of suspicion to start.  Emphasized importance of extreme close vigilance of any new or worsening symptoms, of which Donna Ferguson understands to go immediately to seek medical attention and wound check in 1 week.  Patient verbalized understanding of all.

## 2020-01-13 NOTE — Assessment & Plan Note (Signed)
resolved 

## 2020-01-13 NOTE — Progress Notes (Signed)
Subjective:    Patient ID: Donna Ferguson, female    DOB: 20-Jan-1941, 79 y.o.   MRN: [id number]  CC: Donna Ferguson is a 79 y.o. female who presents today for follow up.   HPI: Left shin lesion suspected bug bite which started 5 days, improved. Was very pruritic at first and improving. She itched so much she broke the skin and  not sure if it was her itching that caused the lesion. No tick. Was planting roses at the time and doesn't recall a thorn sticking her. No known poison ivy or poison okay.   Applied neosporin and triamcinolone cream with no improvement.  In the past notes similar lesions that are intensely puritic which improve with triamcinolone which has kept on hand for years.   No purulent discharge, leg swelling, fever, increased warmth, ha.   Abdominal pain- improved.Feels like constipation was contributory.  using miralax once per day, with formed brown stools.   No mass through vagina , nor concern for bladder prolapse.   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   HISTORY:  Past Medical History:  Diagnosis Date  . Arthritis   . Chronic kidney disease    cyst near kidney  . Heart murmur    past  . History of colonoscopy    2019 in [place]; patient states no longer screening for colon cancer.  . Hypertension   . IBS (irritable bowel syndrome)   . Pre-diabetes    Past Surgical History:  Procedure Laterality Date  . ABDOMINAL HYSTERECTOMY     unsure if has ovaries.   Marland Kitchen BREAST EXCISIONAL BIOPSY Bilateral late 80s, early 90s    benign  . cyst N/A    cyst removal both breast  . JOINT REPLACEMENT Left    knee  . REPLACEMENT TOTAL KNEE  2017   Family History  Problem Relation Age of Onset  . Hypertension Mother   . Diabetes Father   . Hypertension Father   . Heart disease Father   . Hypertension Sister   . Hypertension Daughter     Allergies: Patient has no known allergies. Current Outpatient  Medications on File Prior to Visit  Medication Sig Dispense Refill  . amLODipine (NORVASC) 5 MG tablet Take 1 tablet (5 mg total) by mouth every evening. 90 tablet 1  . Ascorbic Acid (VITAMIN C) 1000 MG tablet Take 1,000 mg by mouth daily.    . hydrochlorothiazide (MICROZIDE) 12.5 MG capsule Take 1 capsule (12.5 mg total) by mouth daily. 90 capsule 0  . metoprolol succinate (TOPROL-XL) 50 MG 24 hr tablet Take 1 tablet (50 mg total) by mouth daily. Take with or immediately following a meal. 30 tablet 0  . amoxicillin (AMOXIL) 500 MG capsule TAKE 4 CAPSULES BY MOUTH 1 HOUR BEFORE DENTAL PROCEDURE     No current facility-administered medications on file prior to visit.    Social History   Tobacco Use  . Smoking status: Never Smoker  . Smokeless tobacco: Never Used  Substance Use Topics  . Alcohol use: Never  . Drug use: Never    Review of Systems  Constitutional: Negative for chills and fever.  Respiratory: Negative for cough.   Cardiovascular: Negative for chest pain, palpitations and leg swelling.  Gastrointestinal: Positive for abdominal pain (improved). Negative for abdominal distention, blood in stool, constipation, diarrhea, nausea and vomiting.  Skin: Positive for wound.      Objective:  BP 130/82 (BP Location: Left Arm, Patient Position: Sitting, Cuff Size: Normal)   Pulse 64   Temp 97.8 F (36.6 C) (Temporal)   Resp 15   Ht 5\' 3"  (1.6 m)   Wt 156 lb 6.4 oz (70.9 kg)   SpO2 98%   BMI 27.71 kg/m  BP Readings from Last 3 Encounters:  01/13/20 130/82  01/01/20 110/62  12/08/19 124/80   Wt Readings from Last 3 Encounters:  01/13/20 156 lb 6.4 oz (70.9 kg)  01/01/20 158 lb (71.7 kg)  12/25/19 156 lb (70.8 kg)    Physical Exam Vitals reviewed.  Constitutional:      Appearance: She is well-developed.  Eyes:     Conjunctiva/sclera: Conjunctivae normal.  Cardiovascular:     Rate and Rhythm: Normal rate and regular rhythm.     Pulses: Normal pulses.     Heart  sounds: Normal heart sounds.  Pulmonary:     Effort: Pulmonary effort is normal.     Breath sounds: Normal breath sounds. No wheezing, rhonchi or rales.  Skin:    General: Skin is warm and dry.     Comments: Left shin scab , excoriation noted with surrounding erythema.. No ulcer or lymphadenopathy appreciated. Mildly tender.  Skin under scab appears fluctuant. No increased warmth, streaking.  Neurological:     Mental Status: She is alert.  Psychiatric:        Speech: Speech normal.        Behavior: Behavior normal.        Thought Content: Thought content normal.          Assessment & Plan:   Problem List Items Addressed This Visit      Digestive   Liver cyst    resolved        Musculoskeletal and Integument   Skin lesion - Primary    Improving. Discussed differentials including arthropod, break in skin for excoriation, early abscess as result of break in skin,  and also less commonly fungal infection from rose thorn. Due to fluctuance, I have higher suspicion of infection and we agreed today to treat with MRSA covering agent, doxycycline. We discussed itraconazole if infection with sporotrichosis however per literature review of wound presentation,  I do not have a high level of suspicion to start.  Emphasized importance of extreme close vigilance of any new or worsening symptoms, of which she understands to go immediately to seek medical attention and wound check in 1 week.  Patient verbalized understanding of all.       Relevant Medications   doxycycline (DORYX) 100 MG EC tablet     Other   Constipation    Improved.  Patient declines any further work-up for abdominal complaint at this time.  Again she declined a GI referral, colonoscopy or Cologuard at this time.  She will let me know if symptom were to worsen or new symptoms develop          I am having Ijeoma Boyum start on doxycycline. I am also having her maintain her metoprolol succinate, vitamin C,  hydrochlorothiazide, amLODipine, and amoxicillin.   Meds ordered this encounter  Medications  . doxycycline (DORYX) 100 MG EC tablet    Sig: Take 1 tablet (100 mg total) by mouth 2 (two) times daily.    Dispense:  20 tablet    Refill:  0    Order Specific Question:   Supervising Provider    Answer:   Crecencio Mc [[id number]]    Return  precautions given.   Risks, benefits, and alternatives of the medications and treatment plan prescribed today were discussed, and patient expressed understanding.   Education regarding symptom management and diagnosis given to patient on AVS.  Continue to follow with Burnard Hawthorne, FNP for routine health maintenance.   Avannah Sangster and I agreed with plan.   [name], FNP

## 2020-01-13 NOTE — Patient Instructions (Signed)
Start doxycycline.  Please stay very vigilant let me know in any way if the redness spreads or you develop new symptoms or see purulent discharge from the wound itself. Please keep ultrasound appointment as discussed  Ensure to take probiotics while on antibiotics and also for 2 weeks after completion. It is important to re-colonize the gut with good bacteria and also to prevent any diarrheal infections associated with antibiotic use.   Close follow up in one week

## 2020-01-13 NOTE — Assessment & Plan Note (Addendum)
Improved.  Patient declines any further work-up for abdominal complaint at this time.  Again she declined a GI referral, colonoscopy or Cologuard at this time.  She will let me know if symptom were to worsen or new symptoms develop

## 2020-01-22 ENCOUNTER — Encounter: Payer: Self-pay | Admitting: Nurse Practitioner

## 2020-01-22 ENCOUNTER — Ambulatory Visit: Payer: Medicare Other | Admitting: Family

## 2020-01-22 ENCOUNTER — Other Ambulatory Visit: Payer: Self-pay

## 2020-01-22 ENCOUNTER — Ambulatory Visit (INDEPENDENT_AMBULATORY_CARE_PROVIDER_SITE_OTHER): Payer: Medicare Other | Admitting: Nurse Practitioner

## 2020-01-22 VITALS — BP 130/80 | HR 64 | Temp 97.7°F | Ht 63.0 in | Wt 158.2 lb

## 2020-01-22 DIAGNOSIS — L989 Disorder of the skin and subcutaneous tissue, unspecified: Secondary | ICD-10-CM | POA: Diagnosis not present

## 2020-01-22 MED ORDER — MUPIROCIN CALCIUM 2 % EX CREA: 1.0000 "application " | TOPICAL_CREAM | Freq: Every day | CUTANEOUS | 0 refills | Status: AC

## 2020-01-22 NOTE — Progress Notes (Signed)
Established Patient Office Visit  Subjective:  Patient ID: Donna Ferguson, female    DOB: 1941/08/13  Age: 79 y.o. MRN: [id number]  CC:  Chief Complaint  Patient presents with  . Acute Visit    wound check on left leg    HPI Donna Ferguson is a 79 yo who presents for skin infection follow up on her left lower leg. She is not sure how it happened. No injury or trauma. Maybe insect bite, pruritus and was rubbing it so hard that it broke the skin. She saw Joycelyn Schmid for the initial encounter and was placed on doxycycline.     Today, the site is about 60 weeks old and less red, still itching, and she wonders if it needs to be opened and drained.  There has been no pus or discharge from the site.  No tenderness at all. She does not think it is as resolved as it should be.  It is getting a scab.  She has 1 more day of her 10-day course of doxycycline.  She has tolerated the antibiotic well.  She has noted no fevers, chills, myalgias or malaise. There has been no surrounding redness, or any other rash.  Patient feels well.  She has not been putting anything on the scab, and it has been open to air.    Past Medical History:  Diagnosis Date  . Arthritis   . Chronic kidney disease    cyst near kidney  . Heart murmur    past  . History of colonoscopy    2019 in [place]; patient states no longer screening for colon cancer.  . Hypertension   . IBS (irritable bowel syndrome)   . Pre-diabetes     Past Surgical History:  Procedure Laterality Date  . ABDOMINAL HYSTERECTOMY     unsure if has ovaries.   Marland Kitchen BREAST EXCISIONAL BIOPSY Bilateral late 80s, early 90s    benign  . cyst N/A    cyst removal both breast  . JOINT REPLACEMENT Left    knee  . REPLACEMENT TOTAL KNEE  2017    Family History  Problem Relation Age of Onset  . Hypertension Mother   . Diabetes Father   . Hypertension Father   . Heart disease Father   . Hypertension Sister   . Hypertension Daughter     Social History    Socioeconomic History  . Marital status: Married    Spouse name: Not on file  . Number of children: 2  . Years of education: Not on file  . Highest education level: Not on file  Occupational History  . Not on file  Tobacco Use  . Smoking status: Never Smoker  . Smokeless tobacco: Never Used  Substance and Sexual Activity  . Alcohol use: Never  . Drug use: Never  . Sexual activity: Not on file  Other Topics Concern  . Not on file  [employer] in [place]- Retired   2 children son & daughter   Married   From [place]   Retired 2011.    Enjoys painting   Social Determinants of Radio broadcast assistant Strain:   . Difficulty of Paying Living Expenses:   Food Insecurity:   . Worried About Charity fundraiser in the Last Year:   . Arboriculturist in the Last Year:   Transportation Needs:   . Film/video editor (Medical):   Marland Kitchen Lack of Transportation (  Non-Medical):   Physical Activity:   . Days of Exercise per Week:   . Minutes of Exercise per Session:   Stress:   . Feeling of Stress :   Social Connections:   . Frequency of Communication with Friends and Family:   . Frequency of Social Gatherings with Friends and Family:   . Attends Religious Services:   . Active Member of Clubs or Organizations:   . Attends Archivist Meetings:   Marland Kitchen Marital Status:   Intimate Partner Violence:   . Fear of Current or Ex-Partner:   . Emotionally Abused:   Marland Kitchen Physically Abused:   . Sexually Abused:     Outpatient Medications Prior to Visit  Medication Sig Dispense Refill  . amLODipine (NORVASC) 5 MG tablet Take 1 tablet (5 mg total) by mouth every evening. 90 tablet 1  . amoxicillin (AMOXIL) 500 MG capsule TAKE 4 CAPSULES BY MOUTH 1 HOUR BEFORE DENTAL PROCEDURE    . Ascorbic Acid (VITAMIN C) 1000 MG tablet Take 1,000 mg by mouth daily.    Marland Kitchen doxycycline (VIBRA-TABS) 100 MG tablet Take 1 tablet (100 mg total) by  mouth 2 (two) times daily for 10 days. 20 tablet 0  . hydrochlorothiazide (MICROZIDE) 12.5 MG capsule Take 1 capsule (12.5 mg total) by mouth daily. 90 capsule 0  . metoprolol succinate (TOPROL-XL) 50 MG 24 hr tablet Take 1 tablet (50 mg total) by mouth daily. Take with or immediately following a meal. 30 tablet 0   No facility-administered medications prior to visit.    No Known Allergies  ROS Pertinent positives as noted in history of present illness.     Objective:    Physical Exam  Constitutional: She appears well-developed and well-nourished.  Skin:  Left anterior shin wound examined and compared to the photo from last week.  There is definitely less redness, and a scab has formed.  The skin looks dry.  With palpation there is no tenderness, fluctuance, or any sense that there is fluid buildup behind this scab.  There is no surrounding cellulitis.  Both bilateral legs have appearance of slight non- pitting edema.  No swelling noted in the left leg.  Left calf is soft.  No other rashes noted.  Vitals reviewed.   BP 130/80 (BP Location: Left Arm, Patient Position: Sitting, Cuff Size: Normal)   Pulse 64   Temp 97.7 F (36.5 C) (Skin)   Ht 5\' 3"  (1.6 m)   Wt 158 lb 3.2 oz (71.8 kg)   SpO2 98%   BMI 28.02 kg/m  Wt Readings from Last 3 Encounters:  01/22/20 158 lb 3.2 oz (71.8 kg)  01/13/20 156 lb 6.4 oz (70.9 kg)  01/01/20 158 lb (71.7 kg)     Health Maintenance Due  Topic Date Due  . TETANUS/TDAP  Never done  . PNA vac Low Risk Adult (1 of 2 - PCV13) Never done    There are no preventive care reminders to display for this patient.  Lab Results  Component Value Date   TSH 0.96 05/04/2019   Lab Results  Component Value Date   WBC 8.0 11/07/2019   HGB 14.0 11/07/2019   HCT 41.9 11/07/2019   MCV 85.7 11/07/2019   PLT 289 11/07/2019   Lab Results  Component Value Date   NA 138 12/04/2019   K 3.9 12/04/2019   CO2 30 12/04/2019   GLUCOSE 99 12/04/2019   BUN  24 (H) 12/04/2019   CREATININE 1.09 12/04/2019   BILITOT  1.0 11/07/2019   ALKPHOS 73 11/07/2019   AST 24 11/07/2019   ALT 15 11/07/2019   PROT 7.3 11/07/2019   ALBUMIN 3.6 11/07/2019   CALCIUM 9.7 12/04/2019   ANIONGAP 10 11/07/2019   GFR 58.86 (L) 12/04/2019   Lab Results  Component Value Date   CHOL 165 05/04/2019   Lab Results  Component Value Date   HDL 55.20 05/04/2019   Lab Results  Component Value Date   LDLCALC 90 05/04/2019   Lab Results  Component Value Date   TRIG 102.0 05/04/2019   Lab Results  Component Value Date   CHOLHDL 3 05/04/2019   Lab Results  Component Value Date   HGBA1C 6.2 12/04/2019      Assessment & Plan:   Problem List Items Addressed This Visit      Musculoskeletal and Integument   Skin lesion - Primary      Meds ordered this encounter  Medications  . mupirocin cream (BACTROBAN) 2 %    Sig: Apply 1 application topically daily for 4 days.    Dispense:  4 g    Refill:  0    Order Specific Question:   Supervising Provider    Answer:   Einar Pheasant [[id number]]   Patient advised: Your leg does appear to be healing.  You have had 9-day course of doxycycline.  It is less red, and there is a scab present.  There is very slight swelling, but I do not feel an abscess and it is not tender as one would expect under the wound if it needed to be opened up and drained. It is dry and needs moisturized.   Try placing Bactroban ointment on it once a day for 3 -4  days.   Monitor the site and it should slowly heal. If it becomes tender, red, or grows in any way , please return for evaluation.   Follow-up: Return if symptoms worsen or fail to improve.  This visit occurred during the SARS-CoV-2 public health emergency.  Safety protocols were in place, including screening questions prior to the visit, additional usage of staff PPE, and extensive cleaning of exam room while observing appropriate contact time as indicated for disinfecting solutions.     Denice Paradise, NP

## 2020-01-22 NOTE — Patient Instructions (Addendum)
Your leg does appear to be healing.  You have had 9-day course of doxycycline.  It is less red, and there is a scab present.  There is very slight swelling, but I do not feel an abscess and it is not tender as one would expect under the wound if it needed to be opened up and drained. It is dry and needs moisturized.   Try placing Bactroban ointment on it once a day for 3 -4  days.   Monitor the site and it should slowly heal. If it becomes tender, red, or grows in any way , please return for evaluation.

## 2020-01-23 NOTE — Assessment & Plan Note (Signed)
Appears to be healing.  Had 9-day course of doxycycline.  It is less red, and there is a scab present.  There is very slight swelling, but I do not feel an abscess and it is not tender as one would expect under the wound if it needed to be opened up and drained. It is dry and needs moisturized.   Try placing Bactroban ointment on it once a day for 3 -4  days.   Monitor the site and it should slowly heal. If it becomes tender, red, or grows in any way , please return for evaluation.

## 2020-01-25 ENCOUNTER — Ambulatory Visit
Admission: RE | Admit: 2020-01-25 | Discharge: 2020-01-25 | Disposition: A | Discharge status: Home / Self Care | Payer: Medicare Other | Source: Ambulatory Visit | Attending: Family | Admitting: Family

## 2020-01-25 ENCOUNTER — Other Ambulatory Visit: Payer: Self-pay

## 2020-01-25 DIAGNOSIS — N281 Cyst of kidney, acquired: Secondary | ICD-10-CM | POA: Insufficient documentation

## 2020-01-28 ENCOUNTER — Telehealth: Payer: Self-pay | Admitting: Family

## 2020-01-28 ENCOUNTER — Other Ambulatory Visit: Payer: Self-pay | Admitting: Internal Medicine

## 2020-01-28 DIAGNOSIS — N281 Cyst of kidney, acquired: Secondary | ICD-10-CM

## 2020-01-28 NOTE — Telephone Encounter (Signed)
Pt called and stated that she would like a referral for a neurologist

## 2020-02-01 NOTE — Telephone Encounter (Signed)
Called patient. Unable to leave message.

## 2020-02-01 NOTE — Telephone Encounter (Signed)
Call patient I do not have any problem with placing referral. However I wasnt  Sure why she wanted the referral.  Can she get more detail as to why?

## 2020-02-04 NOTE — Telephone Encounter (Signed)
LMTCB

## 2020-02-05 NOTE — Telephone Encounter (Signed)
Please mail letter to call us with further detail and information in regards to referral to neurology. We were not sure of reason and need this to make referral. She has referral to urology, Dr Diamantina Providence and appointment on 02/17/20 which we can see.

## 2020-02-05 NOTE — Telephone Encounter (Signed)
Mychart sent to patient.

## 2020-02-06 ENCOUNTER — Other Ambulatory Visit: Payer: Self-pay | Admitting: Family

## 2020-02-06 DIAGNOSIS — I10 Essential (primary) hypertension: Secondary | ICD-10-CM

## 2020-02-09 ENCOUNTER — Encounter: Payer: Self-pay | Admitting: Family

## 2020-02-09 ENCOUNTER — Ambulatory Visit (INDEPENDENT_AMBULATORY_CARE_PROVIDER_SITE_OTHER): Payer: Medicare Other | Admitting: Family

## 2020-02-09 ENCOUNTER — Other Ambulatory Visit: Payer: Self-pay

## 2020-02-09 ENCOUNTER — Ambulatory Visit (INDEPENDENT_AMBULATORY_CARE_PROVIDER_SITE_OTHER): Payer: Medicare Other

## 2020-02-09 VITALS — BP 108/76 | HR 74 | Temp 97.2°F | Ht 63.0 in | Wt 157.0 lb

## 2020-02-09 DIAGNOSIS — L989 Disorder of the skin and subcutaneous tissue, unspecified: Secondary | ICD-10-CM

## 2020-02-09 NOTE — Progress Notes (Signed)
Subjective:    Patient ID: Donna Ferguson, female    DOB: June 03, 1941, 79 y.o.   MRN: [id number]  CC: Donna Ferguson is a 79 y.o. female who presents today for follow up.   HPI: Follow-up left suspected shin suspect infection.    Improved.    Wound is 'not as puffy', and redness has improved. Walking without pain. Saw small dot of cream colored discharge once, hasnt recurred. No increased heat, warmth, numbness.  Completed one week doxycycline 3 weeks ago  Dentist has placed her amoxicillin one week ago for dental procedure; course is 10 days.   No longer on mupirocin.  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  No h/o cellulitis   No fever, chills, leg pain, leg swelling  No h/o skin cancer  HTN- compliant with medications.Improved since taking one norvasc at night. No CP, sob     01/22/20 started mupirocin by colleague kim  HISTORY:  Past Medical History:  Diagnosis Date  . Arthritis   . Chronic kidney disease    cyst near kidney  . Heart murmur    past  . History of colonoscopy    2019 in [place]; patient states no longer screening for colon cancer.  . Hypertension   . IBS (irritable bowel syndrome)   . Pre-diabetes    Past Surgical History:  Procedure Laterality Date  . ABDOMINAL HYSTERECTOMY     unsure if has ovaries.   Marland Kitchen BREAST EXCISIONAL BIOPSY Bilateral late 80s, early 90s    benign  . cyst N/A    cyst removal both breast  . JOINT REPLACEMENT Left    knee  . REPLACEMENT TOTAL KNEE  2017   Family History  Problem Relation Age of Onset  . Hypertension Mother   . Diabetes Father   . Hypertension Father   . Heart disease Father   . Hypertension Sister   . Hypertension Daughter     Allergies: Patient has no known allergies. Current Outpatient Medications on File Prior to Visit  Medication Sig Dispense Refill  . amLODipine (NORVASC) 5 MG tablet Take 1 tablet (5 mg total) by mouth every evening. 90 tablet 1  . amoxicillin (AMOXIL) 500 MG  capsule TAKE 4 CAPSULES BY MOUTH 1 HOUR BEFORE DENTAL PROCEDURE    . Ascorbic Acid (VITAMIN C) 1000 MG tablet Take 1,000 mg by mouth daily.    . hydrochlorothiazide (MICROZIDE) 12.5 MG capsule TAKE 1 CAPSULE(12.5 MG) BY MOUTH DAILY 90 capsule 1  . metoprolol succinate (TOPROL-XL) 50 MG 24 hr tablet Take 1 tablet (50 mg total) by mouth daily. Take with or immediately following a meal. 30 tablet 0   No current facility-administered medications on file prior to visit.    Social History   Tobacco Use  . Smoking status: Never Smoker  . Smokeless tobacco: Never Used  Vaping Use  . Vaping Use: Never used  Substance Use Topics  . Alcohol use: Never  . Drug use: Never    Review of Systems  Constitutional: Negative for chills and fever.  Respiratory: Negative for cough.   Cardiovascular: Negative for chest pain, palpitations and leg swelling.  Gastrointestinal: Negative for nausea and vomiting.  Musculoskeletal: Negative for arthralgias.  Skin: Positive for wound. Negative for rash.      Objective:    BP 108/76   Pulse 74   Temp (!) 97.2 F (36.2 C) (Temporal)   Ht 5\' 3"  (1.6 m)   Wt 157 lb (71.2 kg)   SpO2  99%   BMI 27.81 kg/m  BP Readings from Last 3 Encounters:  02/09/20 108/76  01/22/20 130/80  01/13/20 130/82   Wt Readings from Last 3 Encounters:  02/09/20 157 lb (71.2 kg)  01/22/20 158 lb 3.2 oz (71.8 kg)  01/13/20 156 lb 6.4 oz (70.9 kg)    Physical Exam Vitals reviewed.  Constitutional:      Appearance: She is well-developed.  Eyes:     Conjunctiva/sclera: Conjunctivae normal.  Cardiovascular:     Rate and Rhythm: Normal rate and regular rhythm.     Pulses: Normal pulses.     Heart sounds: Normal heart sounds.     Comments: No LE edema, palpable cords or masses. No erythema or increased warmth. No asymmetry in calf size when compared bilaterally LE hair growth symmetric and present. No discoloration of varicosities noted. LE warm and palpable pedal  pulses.  Pulmonary:     Effort: Pulmonary effort is normal.     Breath sounds: Normal breath sounds. No wheezing, rhonchi or rales.  Musculoskeletal:     Right lower leg: No edema.     Left lower leg: No edema.  Skin:    General: Skin is warm and dry.          Comments: Oval shaped area of mild erythema. No rash. Nonfluctuant. Nontender. No bogginess appreciated.   Neurological:     Mental Status: She is alert.  Psychiatric:        Speech: Speech normal.        Behavior: Behavior normal.        Thought Content: Thought content normal.        Assessment & Plan:   Problem List Items Addressed This Visit      Musculoskeletal and Integument   Skin lesion - Primary    Improving in regards to tenderness, erythema. Area is nonfluctuant . It is not boggy. Tib/Fib XR doesn't reflect osteomyelitis which was performed based on duration of symptom alone. Clinical exam did not raise clinical suspicion for this. Exam did not support vascular ulcer. Advised patient that amoxicillin will cover beta hemolytic strep, doxycycline MRSA. Will defer making further antibiotic changes due to risk of diarrheal infections and in the context of improving.  Referral has been placed to dermatology and have asked patient to schedule if she doesn't experience complete resolution.       Relevant Orders   DG Tibia/Fibula Left (Completed)   Ambulatory referral to Dermatology       I am having Donna Ferguson maintain her metoprolol succinate, vitamin C, amLODipine, amoxicillin, and hydrochlorothiazide.   No orders of the defined types were placed in this encounter.   Return precautions given.   Risks, benefits, and alternatives of the medications and treatment plan prescribed today were discussed, and patient expressed understanding.   Education regarding symptom management and diagnosis given to patient on AVS.  Continue to follow with Burnard Hawthorne, FNP for routine health maintenance.    Donna Ferguson and I agreed with plan.   [name], FNP

## 2020-02-09 NOTE — Patient Instructions (Signed)
Complete amoxicillin  Ensure to take probiotics while on antibiotics and also for 2 weeks after completion. It is important to re-colonize the gut with good bacteria and also to prevent any diarrheal infections associated with antibiotic use.   Referral to dermatology  Let us know if you dont hear back within a week in regards to an appointment being scheduled.

## 2020-02-10 NOTE — Assessment & Plan Note (Addendum)
Improving in regards to tenderness, erythema. Area is nonfluctuant . It is not boggy. Tib/Fib XR doesn't reflect osteomyelitis which was performed based on duration of symptom alone. Clinical exam did not raise clinical suspicion for this. Exam did not support vascular ulcer. Advised patient that amoxicillin will cover beta hemolytic strep, doxycycline MRSA. Will defer making further antibiotic changes due to risk of diarrheal infections and in the context of improving.  Referral has been placed to dermatology and have asked patient to schedule if she doesn't experience complete resolution.

## 2020-02-17 ENCOUNTER — Encounter: Payer: Self-pay | Admitting: Urology

## 2020-02-17 ENCOUNTER — Ambulatory Visit (INDEPENDENT_AMBULATORY_CARE_PROVIDER_SITE_OTHER): Payer: Medicare Other | Admitting: Urology

## 2020-02-17 ENCOUNTER — Other Ambulatory Visit: Payer: Self-pay

## 2020-02-17 VITALS — BP 129/82 | HR 87 | Ht 63.0 in | Wt 155.0 lb

## 2020-02-17 DIAGNOSIS — N281 Cyst of kidney, acquired: Secondary | ICD-10-CM

## 2020-02-17 NOTE — Progress Notes (Signed)
02/17/20 12:21 PM   Donna Ferguson Feb 03, 1941 [id number]  CC: Right renal cysts  HPI: I saw Donna Ferguson in urology clinic today for evaluation of right renal cyst.  She is a very healthy 79 year old African-American female originally from [place] with relatively normal renal function with creatinine of 1.09, EGFR of 60 who was recently found to have right-sided renal cysts on a CT without contrast and ultrasound.  She reports she is known about these for many years, and was previously followed by a urologist in [place].  A CT without contrast was performed in May 2021 for lower pelvic abdominal pain and showed no acute abnormalities, and low-attenuation right-sided renal masses consistent with cyst.  The largest measured 4.6 cm in the lower pole.  A follow-up renal ultrasound was performed which showed a minimally complex cyst and no solid lesion worrisome for malignancy.  She denies any gross hematuria or urinary symptoms.  Her primary complaint today is lower pelvic and lower abdominal pain when she sleeps on her stomach that last for a few minutes after getting up.  She denies any back or flank pain.  Dipstick urinalysis in April was benign.  She is a never smoker and denies any other carcinogenic exposures.   PMH: Past Medical History:  Diagnosis Date   Arthritis    Chronic kidney disease    cyst near kidney   Heart murmur    past   History of colonoscopy    2019 in [place]; patient states no longer screening for colon cancer.   Hypertension    IBS (irritable bowel syndrome)    Pre-diabetes    Renal cyst     Surgical History: Past Surgical History:  Procedure Laterality Date   ABDOMINAL HYSTERECTOMY     unsure if has ovaries.    BREAST EXCISIONAL BIOPSY Bilateral late 80s, early 90s    benign   cyst N/A    cyst removal both breast   JOINT REPLACEMENT Left    knee   REPLACEMENT TOTAL KNEE  2017    Family History: Family History  Problem Relation Age of Onset    Hypertension Mother    Diabetes Father    Hypertension Father    Heart disease Father    Hypertension Sister    Hypertension Daughter     Social History:  reports that she has never smoked. She has never used smokeless tobacco. She reports that she does not drink alcohol and does not use drugs.  Physical Exam: BP 129/82 (BP Location: Left Arm, Patient Position: Sitting, Cuff Size: Normal)    Pulse 87    Ht '5\' 3"'$  (1.6 m)    Wt 155 lb (70.3 kg)    BMI 27.46 kg/m    Constitutional:  Alert and oriented, No acute distress. Cardiovascular: No clubbing, cyanosis, or edema. Respiratory: Normal respiratory effort, no increased work of breathing. GI: Abdomen is soft, nontender, nondistended, no abdominal masses GU: No CVA tenderness  Laboratory Data: Reviewed, see HPI  Pertinent Imaging: I have personally reviewed the CT abdomen pelvis and renal ultrasound, see HPI for full details  Assessment & Plan:   In summary, she is a healthy 79 year old female with lower abdominal pain after eating up from sleeping and a 4.6 cm right simple appearing renal cyst on both CT and ultrasound.  She reports she has had this for many years, and was previously identified by a urologist in [place].  We discussed renal cysts at length, and I do not  feel this is related in any way to her lower abdominal pain after sleeping.  Based on the imaging we have, no further evaluation would be needed for these simple appearing renal cyst.  I will obtain her outside imaging from [place] to evaluate for any change.  We will call with those results, and if any further imaging or follow-up is needed.  Will call with outside urology records regarding duration and any change in renal cysts  Nickolas Madrid, MD 02/17/2020  [facility] [address] [place], [place] [address] [phone]

## 2020-02-26 ENCOUNTER — Encounter: Payer: Self-pay | Admitting: Cardiology

## 2020-02-26 ENCOUNTER — Other Ambulatory Visit: Payer: Self-pay

## 2020-02-26 ENCOUNTER — Ambulatory Visit (INDEPENDENT_AMBULATORY_CARE_PROVIDER_SITE_OTHER): Payer: Medicare Other | Admitting: Cardiology

## 2020-02-26 VITALS — BP 120/72 | HR 65 | Ht 63.0 in | Wt 159.8 lb

## 2020-02-26 DIAGNOSIS — I10 Essential (primary) hypertension: Secondary | ICD-10-CM | POA: Diagnosis not present

## 2020-02-26 DIAGNOSIS — I340 Nonrheumatic mitral (valve) insufficiency: Secondary | ICD-10-CM | POA: Diagnosis not present

## 2020-02-26 NOTE — Progress Notes (Signed)
Cardiology Office Note:    Date:  02/26/2020   ID:  Donna Ferguson, Donna Ferguson 1940/11/07, MRN [id number]  PCP:  Burnard Hawthorne, FNP  Cardiologist:  Kate Sable, MD  Electrophysiologist:  None   Referring MD: Burnard Hawthorne, FNP   Chief Complaint  Patient presents with  . other    3 month follow up. Meds reviewed by the pt. verbally. "doing well."     History of Present Illness:    Donna Ferguson is a 79 y.o. female with a hx of hypertension, knee arthritis, mitral valve insufficiency who presents for follow-up.  He was previously seen for preop eval prior to right knee surgery.  It was okay from a cardiac perspective to proceed with procedure.  Patient also has a history of mitral valve insufficiency.  Echocardiogram was ordered to evaluate presence and or significance of valvular disease.  She now presents for results, states doing well has no concerns at this time.  Prior right knee surgery was postponed due to recent oral surgery.   Past Medical History:  Diagnosis Date  . Arthritis   . Chronic kidney disease    cyst near kidney  . Heart murmur    past  . History of colonoscopy    2019 in [place]; patient states no longer screening for colon cancer.  . Hypertension   . IBS (irritable bowel syndrome)   . Pre-diabetes   . Renal cyst     Past Surgical History:  Procedure Laterality Date  . ABDOMINAL HYSTERECTOMY     unsure if has ovaries.   Marland Kitchen BREAST EXCISIONAL BIOPSY Bilateral late 80s, early 90s    benign  . cyst N/A    cyst removal both breast  . JOINT REPLACEMENT Left    knee  . REPLACEMENT TOTAL KNEE  2017    Current Medications: Current Meds  Medication Sig  . amLODipine (NORVASC) 5 MG tablet Take 1 tablet (5 mg total) by mouth every evening.  . Ascorbic Acid (VITAMIN C) 1000 MG tablet Take 1,000 mg by mouth daily.  . hydrochlorothiazide (MICROZIDE) 12.5 MG capsule TAKE 1 CAPSULE(12.5 MG) BY MOUTH DAILY  . metoprolol succinate (TOPROL-XL) 50 MG 24 hr  tablet Take 1 tablet (50 mg total) by mouth daily. Take with or immediately following a meal.     Allergies:   Patient has no known allergies.   Social History   Socioeconomic History  . Marital status: Married    Spouse name: Not on file  . Number of children: 2  . Years of education: Not on file  . Highest education level: Not on file  Occupational History  . Not on file  Tobacco Use  . Smoking status: Never Smoker  . Smokeless tobacco: Never Used  Vaping Use  . Vaping Use: Never used  Substance and Sexual Activity  . Alcohol use: Never  . Drug use: Never  . Sexual activity: Yes    Birth control/protection: Surgical, Post-menopausal  Other Topics Concern  . Not on file  [employer] in [place]- Retired   2 children son & daughter   Married   From [employer]   Retired 2011.    Enjoys painting   Social Determinants of Radio broadcast assistant Strain:   . Difficulty of Paying Living Expenses:   Food Insecurity:   . Worried About Charity fundraiser in the Last Year:   . [place] in the  Last Year:   Transportation Needs:   . Film/video editor (Medical):   Marland Kitchen Lack of Transportation (Non-Medical):   Physical Activity:   . Days of Exercise per Week:   . Minutes of Exercise per Session:   Stress:   . Feeling of Stress :   Social Connections:   . Frequency of Communication with Friends and Family:   . Frequency of Social Gatherings with Friends and Family:   . Attends Religious Services:   . Active Member of Clubs or Organizations:   . Attends Archivist Meetings:   Marland Kitchen Marital Status:      Family History: The patient's family history includes Diabetes in her father; Heart disease in her father; Hypertension in her daughter, father, mother, and sister.  ROS:   Please see the history of present illness.     All other systems reviewed and are negative.   EKGs/Labs/Other Studies Reviewed:     The following studies were reviewed today:   EKG:  EKG is  ordered today.  The ekg ordered today demonstrates sinus rhythm, normal ECG.  Recent Labs: 05/04/2019: TSH 0.96 11/07/2019: ALT 15; Hemoglobin 14.0; Platelets 289 11/11/2019: Magnesium 1.9 12/04/2019: BUN 24; Creatinine, Ser 1.09; Potassium 3.9; Sodium 138  Recent Lipid Panel    Component Value Date/Time   CHOL 165 05/04/2019 0858   TRIG 102.0 05/04/2019 0858   HDL 55.20 05/04/2019 0858   CHOLHDL 3 05/04/2019 0858   VLDL 20.4 05/04/2019 0858   LDLCALC 90 05/04/2019 0858    Physical Exam:    VS:  BP 120/72 (BP Location: Left Arm, Patient Position: Sitting, Cuff Size: Normal)   Pulse 65   Ht 5\' 3"  (1.6 m)   Wt 159 lb 12 oz (72.5 kg)   SpO2 99%   BMI 28.30 kg/m     Wt Readings from Last 3 Encounters:  02/26/20 159 lb 12 oz (72.5 kg)  02/17/20 155 lb (70.3 kg)  02/09/20 157 lb (71.2 kg)     GEN:  Well nourished, well developed in no acute distress HEENT: Normal NECK: No JVD; No carotid bruits LYMPHATICS: No lymphadenopathy CARDIAC: RRR, no murmurs, rubs, gallops RESPIRATORY:  Clear to auscultation without rales, wheezing or rhonchi  ABDOMEN: Soft, non-tender, non-distended MUSCULOSKELETAL:  No edema; No deformity  SKIN: Warm and dry NEUROLOGIC:  Alert and oriented x 3 PSYCHIATRIC:  Normal affect   ASSESSMENT:    1. Mitral valve insufficiency, unspecified etiology   2. Essential hypertension    PLAN:    In order of problems listed above:  1. History of mitral valve insufficiency.  Echocardiogram showed normal systolic function, impaired relaxation, trivial mitral regurgitation, EF 60 to 65%.  Patient made aware of results and reassured. 2. History of hypertension.  Blood pressure adequately controlled.  Continue current BP meds.  Follow-up in 1 year.  This note was generated in part or whole with voice recognition software. Voice recognition is usually quite accurate but there are transcription errors  that can and very often do occur. I apologize for any typographical errors that were not detected and corrected.  Medication Adjustments/Labs and Tests Ordered: Current medicines are reviewed at length with the patient today.  Concerns regarding medicines are outlined above.  Orders Placed This Encounter  Procedures  . EKG 12-Lead   No orders of the defined types were placed in this encounter.   Patient Instructions  Medication Instructions:   Your physician recommends that you continue on your current medications as directed.  Please refer to the Current Medication list given to you today.  *If you need a refill on your cardiac medications before your next appointment, please call your pharmacy*   Lab Work: None Ordered If you have labs (blood work) drawn today and your tests are completely normal, you will receive your results only by: Marland Kitchen MyChart Message (if you have MyChart) OR . A paper copy in the mail If you have any lab test that is abnormal or we need to change your treatment, we will call you to review the results.   Testing/Procedures: None Ordered   Follow-Up: At [facility], you and your health needs are our priority.  As part of our continuing mission to provide you with exceptional heart care, we have created designated Provider Care Teams.  These Care Teams include your primary Cardiologist (physician) and Advanced Practice Providers (APPs -  Physician Assistants and Nurse Practitioners) who all work together to provide you with the care you need, when you need it.  We recommend signing up for the patient portal called "MyChart".  Sign up information is provided on this After Visit Summary.  MyChart is used to connect with patients for Virtual Visits (Telemedicine).  Patients are able to view lab/test results, encounter notes, upcoming appointments, etc.  Non-urgent messages can be sent to your provider as well.   To learn more about what you can do with MyChart, go  to [email or web address].    Your next appointment:   1 year(s)  The format for your next appointment:   In Person  Provider:   Kate Sable, MD   Other Instructions N/A     Signed, Kate Sable, MD  02/26/2020 1:03 PM    [place]

## 2020-02-26 NOTE — Patient Instructions (Signed)
Medication Instructions:   Your physician recommends that you continue on your current medications as directed. Please refer to the Current Medication list given to you today.  *If you need a refill on your cardiac medications before your next appointment, please call your pharmacy*   Lab Work: None Ordered If you have labs (blood work) drawn today and your tests are completely normal, you will receive your results only by: Marland Kitchen MyChart Message (if you have MyChart) OR . A paper copy in the mail If you have any lab test that is abnormal or we need to change your treatment, we will call you to review the results.   Testing/Procedures: None Ordered   Follow-Up: At [facility], you and your health needs are our priority.  As part of our continuing mission to provide you with exceptional heart care, we have created designated Provider Care Teams.  These Care Teams include your primary Cardiologist (physician) and Advanced Practice Providers (APPs -  Physician Assistants and Nurse Practitioners) who all work together to provide you with the care you need, when you need it.  We recommend signing up for the patient portal called "MyChart".  Sign up information is provided on this After Visit Summary.  MyChart is used to connect with patients for Virtual Visits (Telemedicine).  Patients are able to view lab/test results, encounter notes, upcoming appointments, etc.  Non-urgent messages can be sent to your provider as well.   To learn more about what you can do with MyChart, go to [email or web address].    Your next appointment:   1 year(s)  The format for your next appointment:   In Person  Provider:   Kate Sable, MD   Other Instructions N/A

## 2020-02-28 ENCOUNTER — Other Ambulatory Visit: Payer: Self-pay | Admitting: Family

## 2020-02-28 DIAGNOSIS — I10 Essential (primary) hypertension: Secondary | ICD-10-CM

## 2020-03-03 ENCOUNTER — Other Ambulatory Visit: Payer: Self-pay | Admitting: Family

## 2020-05-03 ENCOUNTER — Other Ambulatory Visit: Payer: Self-pay | Admitting: Family

## 2020-05-03 DIAGNOSIS — I10 Essential (primary) hypertension: Secondary | ICD-10-CM

## 2020-05-19 ENCOUNTER — Ambulatory Visit (INDEPENDENT_AMBULATORY_CARE_PROVIDER_SITE_OTHER): Payer: Medicare Other | Admitting: Dermatology

## 2020-05-19 ENCOUNTER — Other Ambulatory Visit: Payer: Self-pay

## 2020-05-19 DIAGNOSIS — L820 Inflamed seborrheic keratosis: Secondary | ICD-10-CM

## 2020-05-19 DIAGNOSIS — L821 Other seborrheic keratosis: Secondary | ICD-10-CM

## 2020-05-19 DIAGNOSIS — R21 Rash and other nonspecific skin eruption: Secondary | ICD-10-CM

## 2020-05-19 MED ORDER — TRIAMCINOLONE ACETONIDE 0.1 % EX CREA: 1.0000 "application " | TOPICAL_CREAM | Freq: Two times a day (BID) | CUTANEOUS | 0 refills | Status: DC

## 2020-05-19 NOTE — Progress Notes (Signed)
   New Patient Visit  Subjective  Donna Ferguson is a 79 y.o. female who presents for the following: Other  She reports a spot of left lower leg that started 3-4 months ago. She thinks it happened while she was in the garden. It was very itchy. [name] gave her a cream and an antibiotic and it is much better.  She also has a rash at her left leg.  The following portions of the chart were reviewed this encounter and updated as appropriate:  Tobacco  Allergies  Meds  Problems  Med Hx  Surg Hx  Fam Hx      Review of Systems:  No other skin or systemic complaints except as noted in HPI or Assessment and Plan.  Objective  Well appearing patient in no apparent distress; mood and affect are within normal limits.  A focused examination was performed including upper body, legs and feet. Relevant physical exam findings are noted in the Assessment and Plan.  Objective  Right Thigh - Anterior: Erythematous keratotic or waxy stuck-on papule or plaque.   Objective  Left Lower Leg - Anterior: Erythematous papules adjacent to scar   Assessment & Plan    Seborrheic Keratoses - Stuck-on, waxy, tan-brown papules and plaques  - Discussed benign etiology and prognosis. - Observe - Call for any changes  Inflamed seborrheic keratosis Right Thigh - Anterior  TMC 0.1% cream twice a day as needed for itch up to 3 weeks. Avoid applying to face, groin, and axilla. Use as directed. Risk of skin atrophy with long-term use reviewed.   Topical steroids (such as triamcinolone, fluocinolone, fluocinonide, mometasone, clobetasol, halobetasol, betamethasone, hydrocortisone) can cause thinning and lightening of the skin if they are used for too long in the same area. Your physician has selected the right strength medicine for your problem and area affected on the body. Please use your medication only as directed by your physician to prevent side effects.    Rash Left Lower Leg -  Anterior  Denies topicals other than vaseline. 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 Consider patch testing and/or biopsy in future if not improving.  Start TMC 0.1% cream bid as needed up to 3 weeks. Avoid face, groin, underarms.  Topical steroids (such as triamcinolone, fluocinolone, fluocinonide, mometasone, clobetasol, halobetasol, betamethasone, hydrocortisone) can cause thinning and lightening of the skin if they are used for too long in the same area. Your physician has selected the right strength medicine for your problem and area affected on the body. Please use your medication only as directed by your physician to prevent side effects.    triamcinolone cream (KENALOG) 0.1 % - Left Lower Leg - Anterior  Return in about 6 weeks (around 06/30/2020).   I, Ashok Cordia, CMA, am acting as scribe for Forest Gleason, MD .  Documentation: I have reviewed the above documentation for accuracy and completeness, and I agree with the above.  Forest Gleason, MD

## 2020-06-12 ENCOUNTER — Encounter: Payer: Self-pay | Admitting: Dermatology

## 2020-06-28 ENCOUNTER — Other Ambulatory Visit: Payer: Self-pay

## 2020-06-28 ENCOUNTER — Ambulatory Visit (INDEPENDENT_AMBULATORY_CARE_PROVIDER_SITE_OTHER): Payer: Medicare Other | Admitting: Dermatology

## 2020-06-28 DIAGNOSIS — L820 Inflamed seborrheic keratosis: Secondary | ICD-10-CM | POA: Diagnosis not present

## 2020-06-28 DIAGNOSIS — R21 Rash and other nonspecific skin eruption: Secondary | ICD-10-CM | POA: Diagnosis not present

## 2020-06-28 MED ORDER — CLOBETASOL PROPIONATE 0.05 % EX OINT: TOPICAL_OINTMENT | CUTANEOUS | 0 refills | Status: DC

## 2020-06-28 NOTE — Progress Notes (Signed)
   Follow-Up Visit   Subjective  Donna Ferguson is a 79 y.o. female who presents for the following: Follow-up (Patient here today for 6 week rash and ISK follow up. Rash is at left lower leg and has improved but still does itch sometimes. ISK is at right thigh above knee. Patient was using TMC 0.1% cream twice daily to both areas. ).  The following portions of the chart were reviewed this encounter and updated as appropriate:  Tobacco  Allergies  Meds  Problems  Med Hx  Surg Hx  Fam Hx      Review of Systems:  No other skin or systemic complaints except as noted in HPI or Assessment and Plan.  Objective  Well appearing patient in no apparent distress; mood and affect are within normal limits.  A focused examination was performed including legs. Relevant physical exam findings are noted in the Assessment and Plan.  Objective  Left pretibia: Erythematous hyperpigmented plaque  Objective  Right Knee: Erythematous keratotic or waxy stuck-on papule or plaque.    Assessment & Plan  Rash Left pretibia  Favor papular eczema  Significantly improved but not at goal  D/c triamcinolone Start clobetasol ointment twice daily for 2 weeks then weekends only. Avoid applying to face, groin, and axilla. Use as directed. Risk of skin atrophy with long-term use reviewed.   Topical steroids (such as triamcinolone, fluocinolone, fluocinonide, mometasone, clobetasol, halobetasol, betamethasone, hydrocortisone) can cause thinning and lightening of the skin if they are used for too long in the same area. Your physician has selected the right strength medicine for your problem and area affected on the body. Please use your medication only as directed by your physician to prevent side effects.    Ordered Medications: clobetasol ointment (TEMOVATE) 0.05 %  Inflamed seborrheic keratosis Right Knee  Start clobetasol ointment twice daily for 2 weeks then weekends only. Avoid applying to face,  groin, and axilla. Use as directed. Risk of skin atrophy with long-term use reviewed.   Deferred treatment with liquid nitrogen today but can consider in future.  Return 2-4 weeks, for rash.  Graciella Belton, RMA, am acting as scribe for Forest Gleason, MD .   Documentation: I have reviewed the above documentation for accuracy and completeness, and I agree with the above.  Forest Gleason, MD

## 2020-06-28 NOTE — Patient Instructions (Signed)
Topical steroids (such as triamcinolone, fluocinolone, fluocinonide, mometasone, clobetasol, halobetasol, betamethasone, hydrocortisone) can cause thinning and lightening of the skin if they are used for too long in the same area. Your physician has selected the right strength medicine for your problem and area affected on the body. Please use your medication only as directed by your physician to prevent side effects.   . 

## 2020-07-04 ENCOUNTER — Ambulatory Visit: Payer: Medicare Other | Admitting: Family

## 2020-07-11 ENCOUNTER — Encounter: Payer: Self-pay | Admitting: Dermatology

## 2020-07-12 ENCOUNTER — Other Ambulatory Visit: Payer: Self-pay

## 2020-07-12 ENCOUNTER — Ambulatory Visit (INDEPENDENT_AMBULATORY_CARE_PROVIDER_SITE_OTHER): Payer: Medicare Other | Admitting: Dermatology

## 2020-07-12 DIAGNOSIS — L308 Other specified dermatitis: Secondary | ICD-10-CM | POA: Diagnosis not present

## 2020-07-12 DIAGNOSIS — L820 Inflamed seborrheic keratosis: Secondary | ICD-10-CM | POA: Diagnosis not present

## 2020-07-12 NOTE — Progress Notes (Signed)
   Follow-Up Visit   Subjective  Donna Ferguson is a 79 y.o. female who presents for the following: Rash (3 weeks f/u rash on the L lower leg treating with Clobetasol ointment ) and Skin Problem (Check a irritated itchy spot on the back ).   The following portions of the chart were reviewed this encounter and updated as appropriate:  Tobacco  Allergies  Meds  Problems  Med Hx  Surg Hx  Fam Hx      Review of Systems:  No other skin or systemic complaints except as noted in HPI or Assessment and Plan.  Objective  Well appearing patient in no apparent distress; mood and affect are within normal limits.  A focused examination was performed including Left leg, back. Relevant physical exam findings are noted in the Assessment and Plan.  Objective  Left Lower Leg - Anterior: Hyperpigmented plaque  Objective  Mid Back: Erythematous keratotic or waxy stuck-on papule or plaque.    Assessment & Plan  Other eczema Left Lower Leg - Anterior  Improved but not at goal.    Cont Clobetasol ointment apply to skin qd-bid. Avoid applying to face, groin, and axilla. Use as directed. Risk of skin atrophy with long-term use reviewed.    Inflamed seborrheic keratosis Mid Back  Discussed treatment options Ln2 vs shave removal   Pt will return to the office for LN2 therapy  Return in about 6 weeks (around 08/23/2020) for ISK .  I, Marye Round, CMA, am acting as scribe for Forest Gleason, MD .  Documentation: I have reviewed the above documentation for accuracy and completeness, and I agree with the above.  Forest Gleason, MD

## 2020-07-18 ENCOUNTER — Encounter: Payer: Self-pay | Admitting: Dermatology

## 2020-07-25 ENCOUNTER — Other Ambulatory Visit: Payer: Self-pay | Admitting: Family

## 2020-07-26 ENCOUNTER — Ambulatory Visit (INDEPENDENT_AMBULATORY_CARE_PROVIDER_SITE_OTHER): Payer: Medicare Other

## 2020-07-26 VITALS — BP 127/85 | Ht 63.0 in | Wt 159.0 lb

## 2020-07-26 DIAGNOSIS — Z0000 Encounter for general adult medical examination without abnormal findings: Secondary | ICD-10-CM

## 2020-07-26 NOTE — Progress Notes (Addendum)
Subjective:   Donna Ferguson is a 79 y.o. female who presents for an Initial Medicare Annual Wellness Visit.  Review of Systems    No ROS.  Medicare Wellness Virtual Visit.    Cardiac Risk Factors include: advanced age (>28men, >76 women);hypertension     Objective:    Today's Vitals   07/26/20 1307  BP: 127/85  Weight: 159 lb (72.1 kg)  Height: 5\' 3"  (1.6 m)   Body mass index is 28.17 kg/m.  Advanced Directives 07/26/2020 12/25/2019  Does Patient Have a Medical Advance Directive? No No  Would patient like information on creating a medical advance directive? No - Patient declined No - Patient declined    Current Medications (verified) Outpatient Encounter Medications as of 07/26/2020  Medication Sig  . amLODipine (NORVASC) 5 MG tablet TAKE 1 TABLET(5 MG) BY MOUTH EVERY EVENING  . Ascorbic Acid (VITAMIN C) 1000 MG tablet Take 1,000 mg by mouth daily.  . Calcium Carbonate-Vit D-Min (CALTRATE 600+D PLUS PO) Take 1 tablet by mouth daily.  . clobetasol ointment (TEMOVATE) 0.05 % Apply to affected areas twice daily for 2 weeks then weekends only. Avoid applying to face, groin, and axilla. Use as directed. Risk of skin atrophy with long-term use reviewed.  . hydrochlorothiazide (HYDRODIURIL) 25 MG tablet TAKE 1 TABLET(25 MG) BY MOUTH DAILY  . hydrochlorothiazide (MICROZIDE) 12.5 MG capsule TAKE 1 CAPSULE(12.5 MG) BY MOUTH DAILY  . metoprolol succinate (TOPROL-XL) 50 MG 24 hr tablet TAKE 1 TABLET(50 MG) BY MOUTH DAILY WITH OR IMMEDIATELY FOLLOWING A MEAL  . amoxicillin (AMOXIL) 500 MG capsule TAKE 4 CAPSULES BY MOUTH 1 HOUR BEFORE DENTAL PROCEDURE (Patient not taking: Reported on 07/26/2020)   No facility-administered encounter medications on file as of 07/26/2020.    Allergies (verified) Patient has no known allergies.   History: Past Medical History:  Diagnosis Date  . Arthritis   . Chronic kidney disease    cyst near kidney  . Heart murmur    past  . History of  colonoscopy    2019 in [place]; patient states no longer screening for colon cancer.  . Hypertension   . IBS (irritable bowel syndrome)   . Pre-diabetes   . Renal cyst    Past Surgical History:  Procedure Laterality Date  . ABDOMINAL HYSTERECTOMY     unsure if has ovaries.   Marland Kitchen BREAST EXCISIONAL BIOPSY Bilateral late 80s, early 90s    benign  . cyst N/A    cyst removal both breast  . JOINT REPLACEMENT Left    knee  . REPLACEMENT TOTAL KNEE  2017   Family History  Problem Relation Age of Onset  . Hypertension Mother   . Diabetes Father   . Hypertension Father   . Heart disease Father   . Hypertension Sister   . Hypertension Daughter    Social History   Socioeconomic History  . Marital status: Married    Spouse name: Not on file  . Number of children: 2  . Years of education: Not on file  . Highest education level: Not on file  Occupational History  . Not on file  Tobacco Use  . Smoking status: Never Smoker  . Smokeless tobacco: Never Used  Vaping Use  . Vaping Use: Never used  Substance and Sexual Activity  . Alcohol use: Never  . Drug use: Never  . Sexual activity: Yes    Birth control/protection: Surgical, Post-menopausal  Other Topics Concern  . Not on file  Social History Narrative  [employer] in [place]- Retired   2 children son & daughter   Married   From [place]   Retired 2011.    Enjoys painting   Social Determinants of Health   Financial Resource Strain: Low Risk   . Difficulty of Paying Living Expenses: Not hard at all  Food Insecurity: No Food Insecurity  . Worried About Charity fundraiser in the Last Year: Never true  . Ran Out of Food in the Last Year: Never true  Transportation Needs: No Transportation Needs  . Lack of Transportation (Medical): No  . Lack of Transportation (Non-Medical): No  Physical Activity:   . Days of Exercise per Week: Not on file  . Minutes of Exercise per Session: Not on file  Stress:  No Stress Concern Present  . Feeling of Stress : Not at all  Social Connections: Unknown  . Frequency of Communication with Friends and Family: Not on file  . Frequency of Social Gatherings with Friends and Family: Not on file  . Attends Religious Services: Not on file  . Active Member of Clubs or Organizations: Not on file  . Attends Archivist Meetings: Not on file  . Marital Status: Married    Tobacco Counseling Counseling given: Not Answered   Clinical Intake:  Pre-visit preparation completed: Yes        Diabetes: No  How often do you need to have someone help you when you read instructions, pamphlets, or other written materials from your doctor or pharmacy?: 1 - Never   Interpreter Needed?: No      Activities of Daily Living In your present state of health, do you have any difficulty performing the following activities: 07/26/2020 12/25/2019  Hearing? N N  Vision? N N  Difficulty concentrating or making decisions? N N  Walking or climbing stairs? N Y  Comment - pain  Dressing or bathing? N N  Doing errands, shopping? N N  Preparing Food and eating ? N -  Using the Toilet? N -  In the past six months, have you accidently leaked urine? N -  Do you have problems with loss of bowel control? N -  Managing your Medications? N -  Managing your Finances? N -  Housekeeping or managing your Housekeeping? N -  Some recent data might be hidden    Patient Care Team: Burnard Hawthorne, FNP as PCP - General (Family Medicine) Kate Sable, MD as PCP - Cardiology (Cardiology)  Indicate any recent Medical Services you may have received from other than [facility] providers in the past year (date may be approximate).     Assessment:   This is a routine wellness examination for Donna Ferguson.  I connected with Donna Ferguson today by telephone and verified that I am speaking with the correct person using two identifiers. Location patient: home Location provider: work Persons  participating in the virtual visit: patient, Marine scientist.    I discussed the limitations, risks, security and privacy concerns of performing an evaluation and management service by telephone and the availability of in person appointments. The patient expressed understanding and verbally consented to this telephonic visit.    Interactive audio and video telecommunications were attempted between this provider and patient, however failed, due to patient having technical difficulties OR patient did not have access to video capability.  We continued and completed visit with audio only.  Some vital signs may be absent or patient reported.   Hearing/Vision screen  Hearing Screening  125Hz  250Hz  500Hz  1000Hz  2000Hz  3000Hz  4000Hz  6000Hz  8000Hz   Right ear:           Left ear:           Comments: Patient is able to hear conversational tones without difficulty.  No issues reported.  Vision Screening Comments: Wears corrective lenses Visual acuity not assessed, virtual visit.  They have seen their ophthalmologist in the last 12 months.     Dietary issues and exercise activities discussed: Current Exercise Habits: Home exercise routine, Intensity: Mild  Healthy diet Good water intake  Goals    . Maintain Healthy Lifestyle     Stay active Healthy diet      Depression Screen PHQ 2/9 Scores 07/26/2020 02/09/2020 10/28/2019 05/04/2019  PHQ - 2 Score 0 0 0 0  PHQ- 9 Score - - 0 0    Fall Risk Fall Risk  07/26/2020 02/09/2020 01/13/2020 11/11/2019 05/04/2019  Falls in the past year? 0 0 0 0 0  Number falls in past yr: 0 0 - - 0  Injury with Fall? 0 0 - - 0  Follow up Falls evaluation completed Falls evaluation completed Falls evaluation completed Falls evaluation completed Falls evaluation completed   [place]: Handrails in use when climbing stairs? Yes Home free of loose throw rugs in walkways, pet beds, electrical cords, etc? Yes  Adequate lighting in your home to  reduce risk of falls? Yes   ASSISTIVE DEVICES UTILIZED TO PREVENT FALLS: Life alert? No  Use of a cane, walker or w/c? No   TIMED UP AND GO: Was the test performed? No . Virtual visit.   Cognitive Function:  Patient is alert and oriented x3.  Denies difficulty focusing, making decisions, memory loss.  Enjoys reading.  MMSE/6CIT deferred. Denies difficulty focusing       Immunizations Immunization History  Administered Date(s) Administered  . Fluad Quad(high Dose 65+) 05/04/2019  . Influenza, High Dose Seasonal PF 06/03/2020  . PFIZER SARS-COV-2 Vaccination 09/22/2019, 10/13/2019  . Zoster Recombinat (Shingrix) 01/03/2018, 11/21/2019    TDAP status: Due, Education has been provided regarding the importance of this vaccine. Advised may receive this vaccine at local pharmacy or Health Dept. Aware to provide a copy of the vaccination record if obtained from local pharmacy or Health Dept. Verbalized acceptance and understanding. Deferred.   Pneumonia vaccine- plans to check with [facility] for record.   Health Maintenance Health Maintenance  Topic Date Due  . TETANUS/TDAP  Never done  . PNA vac Low Risk Adult (1 of 2 - PCV13) Never done  . Hepatitis C Screening  07/26/2021 (Originally 12/10/1940)  . INFLUENZA VACCINE  Completed  . DEXA SCAN  Completed  . COVID-19 Vaccine  Completed   Colonoscopy- Hx of completion in [place] 2019. Cologuard; plans to follow up with PCP.  Mammogram- plans to let PCP know if agreeable. Declines order today.   Bone density- completed 06/03/20.   Lung Cancer Screening: (Low Dose CT Chest recommended if Age 15-80 years, 30 pack-year currently smoking OR have quit w/in 15years.) does not qualify.   Hepatitis C Screening: does not qualify.  Vision Screening: Recommended annual ophthalmology exams for early detection of glaucoma and other disorders of the eye. Is the patient up to date with their annual eye exam?  Yes   Dental Screening: Recommended  annual dental exams for proper oral hygiene.  Community Resource Referral / Chronic Care Management: CRR required this visit?  No   CCM required  this visit?  No      Plan:   Keep all routine maintenance appointments.   Follow up 08/05/20 @ 11:00  I have personally reviewed and noted the following in the patient's chart:   . Medical and social history . Use of alcohol, tobacco or illicit drugs  . Current medications and supplements . Functional ability and status . Nutritional status . Physical activity . Advanced directives . List of other physicians . Hospitalizations, surgeries, and ER visits in previous 12 months . Vitals . Screenings to include cognitive, depression, and falls . Referrals and appointments  In addition, I have reviewed and discussed with patient certain preventive protocols, quality metrics, and best practice recommendations. A written personalized care plan for preventive services as well as general preventive health recommendations were provided to patient via mychart.     Varney Biles, LPN   [id number]     Agree Dr. Olivia Mackie McLean-Scocuzza

## 2020-07-26 NOTE — Patient Instructions (Addendum)
Donna Ferguson , Thank you for taking time to come for your Medicare Wellness Visit. I appreciate your ongoing commitment to your health goals. Please review the following plan we discussed and let me know if I can assist you in the future.   These are the goals we discussed: Goals    . Maintain Healthy Lifestyle     Stay active Healthy diet       This is a list of the screening recommended for you and due dates:  Health Maintenance  Topic Date Due  . Tetanus Vaccine  Never done  . Pneumonia vaccines (1 of 2 - PCV13) Never done  .  Hepatitis C: One time screening is recommended by Center for Disease Control  (CDC) for  adults born from [birth year] through 1963.   07/26/2021*  . Flu Shot  Completed  . DEXA scan (bone density measurement)  Completed  . COVID-19 Vaccine  Completed  *Topic was postponed. The date shown is not the original due date.    Immunizations Immunization History  Administered Date(s) Administered  . Fluad Quad(high Dose 65+) 05/04/2019  . PFIZER SARS-COV-2 Vaccination 09/22/2019, 10/13/2019  . Zoster Recombinat (Shingrix) 01/03/2018, 11/21/2019   Keep all routine maintenance appointments.   Follow up 08/05/20 @ 11:00  Advanced directives: not yet completed.  Conditions/risks identified: none new.  Follow up in one year for your annual wellness visit.   Preventive Care 38 Years and Older, Female Preventive care refers to lifestyle choices and visits with your health care provider that can promote health and wellness. What does preventive care include?  A yearly physical exam. This is also called an annual well check.  Dental exams once or twice a year.  Routine eye exams. Ask your health care provider how often you should have your eyes checked.  Personal lifestyle choices, including:  Daily care of your teeth and gums.  Regular physical activity.  Eating a healthy diet.  Avoiding tobacco and drug use.  Limiting alcohol use.  Practicing safe sex.   Taking low doses of aspirin every day.  Taking vitamin and mineral supplements as recommended by your health care provider. What happens during an annual well check? The services and screenings done by your health care provider during your annual well check will depend on your age, overall health, lifestyle risk factors, and family history of disease. Counseling  Your health care provider may ask you questions about your:  Alcohol use.  Tobacco use.  Drug use.  Emotional well-being.  Home and relationship well-being.  Sexual activity.  Eating habits.  History of falls.  Memory and ability to understand (cognition).  Work and work Statistician. Screening  You may have the following tests or measurements:  Height, weight, and BMI.  Blood pressure.  Lipid and cholesterol levels. These may be checked every 5 years, or more frequently if you are over 80 years old.  Skin check.  Lung cancer screening. You may have this screening every year starting at age 40 if you have a 30-pack-year history of smoking and currently smoke or have quit within the past 15 years.  Fecal occult blood test (FOBT) of the stool. You may have this test every year starting at age 41.  Flexible sigmoidoscopy or colonoscopy. You may have a sigmoidoscopy every 5 years or a colonoscopy every 10 years starting at age 66.  Prostate cancer screening. Recommendations will vary depending on your family history and other risks.  Hepatitis C blood test.  Hepatitis B  blood test.  Sexually transmitted disease (STD) testing.  Diabetes screening. This is done by checking your blood sugar (glucose) after you have not eaten for a while (fasting). You may have this done every 1-3 years.  Abdominal aortic aneurysm (AAA) screening. You may need this if you are a current or former smoker.  Osteoporosis. You may be screened starting at age 18 if you are at high risk. Talk with your health care provider about  your test results, treatment options, and if necessary, the need for more tests. Vaccines  Your health care provider may recommend certain vaccines, such as:  Influenza vaccine. This is recommended every year.  Tetanus, diphtheria, and acellular pertussis (Tdap, Td) vaccine. You may need a Td booster every 10 years.  Zoster vaccine. You may need this after age 85.  Pneumococcal 13-valent conjugate (PCV13) vaccine. One dose is recommended after age 64.  Pneumococcal polysaccharide (PPSV23) vaccine. One dose is recommended after age 28. Talk to your health care provider about which screenings and vaccines you need and how often you need them. This information is not intended to replace advice given to you by your health care provider. Make sure you discuss any questions you have with your health care provider. Document Released: 09/02/2015 Document Revised: 04/25/2016 Document Reviewed: 06/07/2015 Elsevier Interactive Patient Education  2017 [place] Prevention in the Home Falls can cause injuries. They can happen to people of all ages. There are many things you can do to make your home safe and to help prevent falls. What can I do on the outside of my home?  Regularly fix the edges of walkways and driveways and fix any cracks.  Remove anything that might make you trip as you walk through a door, such as a raised step or threshold.  Trim any bushes or trees on the path to your home.  Use bright outdoor lighting.  Clear any walking paths of anything that might make someone trip, such as rocks or tools.  Regularly check to see if handrails are loose or broken. Make sure that both sides of any steps have handrails.  Any raised decks and porches should have guardrails on the edges.  Have any leaves, snow, or ice cleared regularly.  Use sand or salt on walking paths during winter.  Clean up any spills in your garage right away. This includes oil or grease spills. What  can I do in the bathroom?  Use night lights.  Install grab bars by the toilet and in the tub and shower. Do not use towel bars as grab bars.  Use non-skid mats or decals in the tub or shower.  If you need to sit down in the shower, use a plastic, non-slip stool.  Keep the floor dry. Clean up any water that spills on the floor as soon as it happens.  Remove soap buildup in the tub or shower regularly.  Attach bath mats securely with double-sided non-slip rug tape.  Do not have throw rugs and other things on the floor that can make you trip. What can I do in the bedroom?  Use night lights.  Make sure that you have a light by your bed that is easy to reach.  Do not use any sheets or blankets that are too big for your bed. They should not hang down onto the floor.  Have a firm chair that has side arms. You can use this for support while you get dressed.  Do not have throw  rugs and other things on the floor that can make you trip. What can I do in the kitchen?  Clean up any spills right away.  Avoid walking on wet floors.  Keep items that you use a lot in easy-to-reach places.  If you need to reach something above you, use a strong step stool that has a grab bar.  Keep electrical cords out of the way.  Do not use floor polish or wax that makes floors slippery. If you must use wax, use non-skid floor wax.  Do not have throw rugs and other things on the floor that can make you trip. What can I do with my stairs?  Do not leave any items on the stairs.  Make sure that there are handrails on both sides of the stairs and use them. Fix handrails that are broken or loose. Make sure that handrails are as long as the stairways.  Check any carpeting to make sure that it is firmly attached to the stairs. Fix any carpet that is loose or worn.  Avoid having throw rugs at the top or bottom of the stairs. If you do have throw rugs, attach them to the floor with carpet tape.  Make sure  that you have a light switch at the top of the stairs and the bottom of the stairs. If you do not have them, ask someone to add them for you. What else can I do to help prevent falls?  Wear shoes that:  Do not have high heels.  Have rubber bottoms.  Are comfortable and fit you well.  Are closed at the toe. Do not wear sandals.  If you use a stepladder:  Make sure that it is fully opened. Do not climb a closed stepladder.  Make sure that both sides of the stepladder are locked into place.  Ask someone to hold it for you, if possible.  Clearly mark and make sure that you can see:  Any grab bars or handrails.  First and last steps.  Where the edge of each step is.  Use tools that help you move around (mobility aids) if they are needed. These include:  Canes.  Walkers.  Scooters.  Crutches.  Turn on the lights when you go into a dark area. Replace any light bulbs as soon as they burn out.  Set up your furniture so you have a clear path. Avoid moving your furniture around.  If any of your floors are uneven, fix them.  If there are any pets around you, be aware of where they are.  Review your medicines with your doctor. Some medicines can make you feel dizzy. This can increase your chance of falling. Ask your doctor what other things that you can do to help prevent falls. This information is not intended to replace advice given to you by your health care provider. Make sure you discuss any questions you have with your health care provider. Document Released: 06/02/2009 Document Revised: 01/12/2016 Document Reviewed: 09/10/2014 Elsevier Interactive Patient Education  2017 [employer].

## 2020-08-04 ENCOUNTER — Encounter: Payer: Self-pay | Admitting: Family

## 2020-08-05 ENCOUNTER — Encounter: Payer: Self-pay | Admitting: Family

## 2020-08-05 ENCOUNTER — Ambulatory Visit (INDEPENDENT_AMBULATORY_CARE_PROVIDER_SITE_OTHER): Payer: Medicare Other | Admitting: Family

## 2020-08-05 ENCOUNTER — Other Ambulatory Visit: Payer: Self-pay

## 2020-08-05 VITALS — BP 122/78 | HR 75 | Temp 98.1°F | Ht 63.0 in | Wt 164.8 lb

## 2020-08-05 DIAGNOSIS — I10 Essential (primary) hypertension: Secondary | ICD-10-CM | POA: Diagnosis not present

## 2020-08-05 DIAGNOSIS — G6289 Other specified polyneuropathies: Secondary | ICD-10-CM | POA: Diagnosis not present

## 2020-08-05 DIAGNOSIS — G629 Polyneuropathy, unspecified: Secondary | ICD-10-CM | POA: Insufficient documentation

## 2020-08-05 DIAGNOSIS — M542 Cervicalgia: Secondary | ICD-10-CM

## 2020-08-05 DIAGNOSIS — K7689 Other specified diseases of liver: Secondary | ICD-10-CM | POA: Diagnosis not present

## 2020-08-05 DIAGNOSIS — M79643 Pain in unspecified hand: Secondary | ICD-10-CM | POA: Insufficient documentation

## 2020-08-05 NOTE — Progress Notes (Signed)
Subjective:    Patient ID: Donna Ferguson, female    DOB: [birth year], 79 y.o.   MRN: [id number]  CC: Donna Ferguson is a 79 y.o. female who presents today for follow up.   HPI: Complains of posterior upper back and shoulder pain 'my arthritis is kicking up', comes and goes over the last 6 months.  Takes prn ibuprofen on occasion with relief Occassionally has distal bilateral numbness in 'all my fingers.' This has been going on for years and it not new. No joint swelling, fever, HA, vision loss. No repetitive movements.   No ckd, h/o GIB.    HTN- compliant with amlodipine 5mg , hctz 25mg , toprol 50mg . No cp, sob   Consult with Dr Charlestine Night for mitral valve insufficiency, echo normal EF. Will follow up in one year.  Saw Dr Diamantina Providence for renal cyst as simple in nature.  HISTORY:  Past Medical History:  Diagnosis Date  . Arthritis   . Chronic kidney disease    cyst near kidney  . Heart murmur    past  . History of colonoscopy    2019 in [place]; patient states no longer screening for colon cancer.  . Hypertension   . IBS (irritable bowel syndrome)   . Pre-diabetes   . Renal cyst    Past Surgical History:  Procedure Laterality Date  . ABDOMINAL HYSTERECTOMY     unsure if has ovaries.   Marland Kitchen BREAST EXCISIONAL BIOPSY Bilateral late 80s, early 90s    benign  . cyst N/A    cyst removal both breast  . JOINT REPLACEMENT Left    knee  . REPLACEMENT TOTAL KNEE  2017   Family History  Problem Relation Age of Onset  . Hypertension Mother   . Diabetes Father   . Hypertension Father   . Heart disease Father   . Hypertension Sister   . Hypertension Daughter     Allergies: Patient has no known allergies. Current Outpatient Medications on File Prior to Visit  Medication Sig Dispense Refill  . amLODipine (NORVASC) 5 MG tablet TAKE 1 TABLET(5 MG) BY MOUTH EVERY EVENING 90 tablet 1  . Ascorbic Acid (VITAMIN C) 1000 MG tablet Take 1,000 mg by mouth daily.    . Calcium Carbonate-Vit D-Min  (CALTRATE 600+D PLUS PO) Take 1 tablet by mouth daily.    . clobetasol ointment (TEMOVATE) 0.05 % Apply to affected areas twice daily for 2 weeks then weekends only. Avoid applying to face, groin, and axilla. Use as directed. Risk of skin atrophy with long-term use reviewed. 30 g 0  . hydrochlorothiazide (HYDRODIURIL) 25 MG tablet TAKE 1 TABLET(25 MG) BY MOUTH DAILY 90 tablet 1  . metoprolol succinate (TOPROL-XL) 50 MG 24 hr tablet TAKE 1 TABLET(50 MG) BY MOUTH DAILY WITH OR IMMEDIATELY FOLLOWING A MEAL 90 tablet 1   No current facility-administered medications on file prior to visit.    Social History   Tobacco Use  . Smoking status: Never Smoker  . Smokeless tobacco: Never Used  Vaping Use  . Vaping Use: Never used  Substance Use Topics  . Alcohol use: Never  . Drug use: Never    Review of Systems  Constitutional: Negative for chills and fever.  Eyes: Negative for visual disturbance.  Respiratory: Negative for cough.   Cardiovascular: Negative for chest pain and palpitations.  Gastrointestinal: Negative for nausea and vomiting.  Musculoskeletal: Positive for arthralgias and neck pain. Negative for neck stiffness.  Neurological: Negative for dizziness and headaches.  Objective:    BP 122/78   Pulse 75   Temp 98.1 F (36.7 C)   Ht 5\' 3"  (1.6 m)   Wt 164 lb 12.8 oz (74.8 kg)   SpO2 98%   BMI 29.19 kg/m  BP Readings from Last 3 Encounters:  08/04/20 122/78  07/26/20 127/85  02/26/20 120/72   Wt Readings from Last 3 Encounters:  08/04/20 164 lb 12.8 oz (74.8 kg)  07/26/20 159 lb (72.1 kg)  02/26/20 159 lb 12 oz (72.5 kg)    Physical Exam Vitals reviewed.  Constitutional:      Appearance: She is well-developed and well-nourished.  Eyes:     Conjunctiva/sclera: Conjunctivae normal.  Neck:   Cardiovascular:     Rate and Rhythm: Normal rate and regular rhythm.     Pulses: Normal pulses.     Heart sounds: Normal heart sounds.  Pulmonary:     Effort:  Pulmonary effort is normal.     Breath sounds: Normal breath sounds. No wheezing, rhonchi or rales.  Musculoskeletal:     Right hand: Normal. Normal sensation.     Left hand: Normal. Normal sensation.     Cervical back: Full passive range of motion without pain and neck supple. No torticollis. Spinous process tenderness present. Normal range of motion.     Comments: Negative phalens. Grip strength normal and intact.   Skin:    General: Skin is warm and dry.  Neurological:     Mental Status: She is alert.  Psychiatric:        Mood and Affect: Mood and affect normal.        Speech: Speech normal.        Behavior: Behavior normal.        Thought Content: Thought content normal.        Assessment & Plan:   Problem List Items Addressed This Visit      Cardiovascular and Mediastinum   HTN (hypertension) - Primary    Stable.continue amlodipine 5mg , hctz 25mg , toprol 50mg       Relevant Orders   Comprehensive metabolic panel   CBC with Differential/Platelet   Lipid panel   TSH     Digestive   Liver cyst     Nervous and Auditory   Peripheral neuropathy   Relevant Orders   TSH     Other   Neck pain    Chronic. Patient declines XR or PT . Insurance wouldn't cover b12 lab and patient declines paying out of pocket at this time; with intermittent neuropathy, I think this is appropriate. Discussed conservative treatment. Patient will let me know if no improvement.           I have discontinued Dhiya Korver's amoxicillin. I am also having her maintain her vitamin C, hydrochlorothiazide, amLODipine, clobetasol ointment, metoprolol succinate, and Calcium Carbonate-Vit D-Min (CALTRATE 600+D PLUS PO).   No orders of the defined types were placed in this encounter.   Return precautions given.   Risks, benefits, and alternatives of the medications and treatment plan prescribed today were discussed, and patient expressed understanding.   Education regarding symptom management  and diagnosis given to patient on AVS.  Continue to follow with Burnard Hawthorne, FNP for routine health maintenance.   Zenab Carline and I agreed with plan.   [name], FNP

## 2020-08-05 NOTE — Assessment & Plan Note (Signed)
Stable.continue amlodipine 5mg , hctz 25mg , toprol 50mg 

## 2020-08-05 NOTE — Assessment & Plan Note (Signed)
Chronic. Patient declines XR or PT . Insurance wouldn't cover b12 lab and patient declines paying out of pocket at this time; with intermittent neuropathy, I think this is appropriate. Discussed conservative treatment. Patient will let me know if no improvement.

## 2020-08-05 NOTE — Patient Instructions (Addendum)
Let's treat conservatively as we discussed.   Over-the-counter medications you may try for arthritic pain include:   ThermaCare patches   Capsaicin cream   Icy hot Voltaren gel   If conservative treatment doesn't yield results, we will consider physical therapy, consult to Sports Medicine/Orthopedics for further evaluation, and imaging.   Nice to see you!

## 2020-08-10 ENCOUNTER — Ambulatory Visit: Payer: Medicare Other | Admitting: Family

## 2020-08-30 ENCOUNTER — Ambulatory Visit (INDEPENDENT_AMBULATORY_CARE_PROVIDER_SITE_OTHER): Payer: Medicare Other | Admitting: Dermatology

## 2020-08-30 ENCOUNTER — Other Ambulatory Visit: Payer: Self-pay

## 2020-08-30 DIAGNOSIS — L820 Inflamed seborrheic keratosis: Secondary | ICD-10-CM

## 2020-08-30 DIAGNOSIS — L821 Other seborrheic keratosis: Secondary | ICD-10-CM

## 2020-08-30 NOTE — Patient Instructions (Addendum)

## 2020-08-30 NOTE — Progress Notes (Signed)
   Follow-Up Visit   Subjective  Donna Ferguson is a 80 y.o. female who presents for the following: Skin Problem (Pt c/o irritated rough itchy growth on the R side of her back, she would like to have it removed today ). Check growths on the L zygoma and R hand changing color.   The following portions of the chart were reviewed this encounter and updated as appropriate:   Tobacco  Allergies  Meds  Problems  Med Hx  Surg Hx  Fam Hx      Review of Systems:  No other skin or systemic complaints except as noted in HPI or Assessment and Plan.  Objective  Well appearing patient in no apparent distress; mood and affect are within normal limits.  A focused examination was performed including back, face, R hand . Relevant physical exam findings are noted in the Assessment and Plan.  Objective  Right Upper Back: Erythematous keratotic or waxy stuck-on papule or plaque.    Assessment & Plan  Inflamed seborrheic keratosis Right Upper Back  Significantly bothersome for patient Reviewed risk of scar, recurrence  Destruction of lesion - Right Upper Back  Destruction method: electrodesiccation and curettage   Destruction method comment:  Lido with epi, Curettage (no electrodessication) Informed consent: discussed and consent obtained   Timeout:  patient name, date of birth, surgical site, and procedure verified Outcome: patient tolerated procedure well with no complications   Post-procedure details: wound care instructions given     Seborrheic Keratoses L zygoma, R hand  - Stuck-on, waxy, tan-brown papules and plaques  - Discussed benign etiology and prognosis. - Observe - Call for any changes  Return if symptoms worsen or fail to improve.  I, Marye Round, CMA, am acting as scribe for Forest Gleason, MD .  Documentation: I have reviewed the above documentation for accuracy and completeness, and I agree with the above.  Forest Gleason, MD

## 2020-08-31 ENCOUNTER — Encounter: Payer: Self-pay | Admitting: Dermatology

## 2020-09-02 ENCOUNTER — Other Ambulatory Visit (INDEPENDENT_AMBULATORY_CARE_PROVIDER_SITE_OTHER): Payer: Medicare Other

## 2020-09-02 ENCOUNTER — Other Ambulatory Visit: Payer: Self-pay

## 2020-09-02 ENCOUNTER — Other Ambulatory Visit: Payer: Self-pay | Admitting: Family

## 2020-09-02 DIAGNOSIS — I10 Essential (primary) hypertension: Secondary | ICD-10-CM

## 2020-09-02 DIAGNOSIS — G6289 Other specified polyneuropathies: Secondary | ICD-10-CM

## 2020-09-02 DIAGNOSIS — E876 Hypokalemia: Secondary | ICD-10-CM

## 2020-09-02 LAB — COMPREHENSIVE METABOLIC PANEL
ALT: 15 U/L (ref 0–35)
AST: 22 U/L (ref 0–37)
Albumin: 4.2 g/dL (ref 3.5–5.2)
Alkaline Phosphatase: 78 U/L (ref 39–117)
BUN: 21 mg/dL (ref 6–23)
CO2: 29 mEq/L (ref 19–32)
Calcium: 10 mg/dL (ref 8.4–10.5)
Chloride: 99 mEq/L (ref 96–112)
Creatinine, Ser: 1.1 mg/dL (ref 0.40–1.20)
GFR: 48.3 mL/min — ABNORMAL LOW (ref >60.00)
Glucose, Bld: 100 mg/dL — ABNORMAL HIGH (ref 70–99)
Potassium: 3.3 mEq/L — ABNORMAL LOW (ref 3.5–5.1)
Sodium: 138 mEq/L (ref 135–145)
Total Bilirubin: 0.6 mg/dL (ref 0.2–1.2)
Total Protein: 7 g/dL (ref 6.0–8.3)

## 2020-09-02 LAB — CBC WITH DIFFERENTIAL/PLATELET
Basophils Absolute: 0 10*3/uL (ref 0.0–0.1)
Basophils Relative: 0.5 % (ref 0.0–3.0)
Eosinophils Absolute: 0 10*3/uL (ref 0.0–0.7)
Eosinophils Relative: 0.5 % (ref 0.0–5.0)
HCT: 39.9 % (ref 36.0–46.0)
Hemoglobin: 13.2 g/dL (ref 12.0–15.0)
Lymphocytes Relative: 40.5 % (ref 12.0–46.0)
Lymphs Abs: 3.3 10*3/uL (ref 0.7–4.0)
MCHC: 33 g/dL (ref 30.0–36.0)
MCV: 88.1 fl (ref 78.0–100.0)
Monocytes Absolute: 0.7 10*3/uL (ref 0.1–1.0)
Monocytes Relative: 8.5 % (ref 3.0–12.0)
Neutro Abs: 4.1 10*3/uL (ref 1.4–7.7)
Neutrophils Relative %: 50 % (ref 43.0–77.0)
Platelets: 262 10*3/uL (ref 150.0–400.0)
RBC: 4.53 Mil/uL (ref 3.87–5.11)
RDW: 14.2 % (ref 11.5–15.5)
WBC: 8.2 10*3/uL (ref 4.0–10.5)

## 2020-09-02 LAB — LIPID PANEL
Cholesterol: 158 mg/dL (ref 0–200)
HDL: 62.8 mg/dL (ref >39.00)
LDL Cholesterol: 72 mg/dL (ref 0–99)
NonHDL: 95.23
Total CHOL/HDL Ratio: 3
Triglycerides: 114 mg/dL (ref 0.0–149.0)
VLDL: 22.8 mg/dL (ref 0.0–40.0)

## 2020-09-02 LAB — TSH: TSH: 1.19 u[IU]/mL (ref 0.35–4.50)

## 2020-09-02 MED ORDER — POTASSIUM CHLORIDE ER 10 MEQ PO TBCR: 10.0000 meq | EXTENDED_RELEASE_TABLET | ORAL | 1 refills | Status: DC

## 2020-09-05 ENCOUNTER — Other Ambulatory Visit: Payer: Medicare Other

## 2020-09-09 ENCOUNTER — Other Ambulatory Visit: Payer: Medicare Other

## 2020-09-12 ENCOUNTER — Other Ambulatory Visit: Payer: Self-pay

## 2020-09-12 ENCOUNTER — Other Ambulatory Visit (INDEPENDENT_AMBULATORY_CARE_PROVIDER_SITE_OTHER): Payer: Medicare Other

## 2020-09-12 ENCOUNTER — Other Ambulatory Visit: Payer: Self-pay | Admitting: Family

## 2020-09-12 DIAGNOSIS — I10 Essential (primary) hypertension: Secondary | ICD-10-CM | POA: Diagnosis not present

## 2020-09-12 LAB — BASIC METABOLIC PANEL
BUN: 28 mg/dL — ABNORMAL HIGH (ref 6–23)
CO2: 30 mEq/L (ref 19–32)
Calcium: 9.9 mg/dL (ref 8.4–10.5)
Chloride: 100 mEq/L (ref 96–112)
Creatinine, Ser: 1.12 mg/dL (ref 0.40–1.20)
GFR: 47.26 mL/min — ABNORMAL LOW (ref >60.00)
Glucose, Bld: 92 mg/dL (ref 70–99)
Potassium: 3.3 mEq/L — ABNORMAL LOW (ref 3.5–5.1)
Sodium: 139 mEq/L (ref 135–145)

## 2020-09-12 MED ORDER — POTASSIUM CHLORIDE ER 10 MEQ PO TBCR: 10.0000 meq | EXTENDED_RELEASE_TABLET | Freq: Every day | ORAL | 1 refills | Status: DC

## 2020-09-15 ENCOUNTER — Other Ambulatory Visit: Payer: Self-pay

## 2020-09-15 DIAGNOSIS — I10 Essential (primary) hypertension: Secondary | ICD-10-CM

## 2020-09-19 ENCOUNTER — Other Ambulatory Visit (INDEPENDENT_AMBULATORY_CARE_PROVIDER_SITE_OTHER): Payer: Medicare Other

## 2020-09-19 ENCOUNTER — Other Ambulatory Visit: Payer: Self-pay

## 2020-09-19 DIAGNOSIS — I10 Essential (primary) hypertension: Secondary | ICD-10-CM | POA: Diagnosis not present

## 2020-09-19 LAB — BASIC METABOLIC PANEL
BUN: 27 mg/dL — ABNORMAL HIGH (ref 6–23)
CO2: 29 mEq/L (ref 19–32)
Calcium: 9.8 mg/dL (ref 8.4–10.5)
Chloride: 99 mEq/L (ref 96–112)
Creatinine, Ser: 1.06 mg/dL (ref 0.40–1.20)
GFR: 50.48 mL/min — ABNORMAL LOW (ref >60.00)
Glucose, Bld: 96 mg/dL (ref 70–99)
Potassium: 3.3 mEq/L — ABNORMAL LOW (ref 3.5–5.1)
Sodium: 137 mEq/L (ref 135–145)

## 2020-09-22 ENCOUNTER — Telehealth: Payer: Self-pay | Admitting: Family

## 2020-09-22 ENCOUNTER — Telehealth: Payer: Self-pay

## 2020-09-22 DIAGNOSIS — E876 Hypokalemia: Secondary | ICD-10-CM

## 2020-09-22 DIAGNOSIS — I10 Essential (primary) hypertension: Secondary | ICD-10-CM

## 2020-09-22 MED ORDER — POTASSIUM CHLORIDE ER 10 MEQ PO TBCR: 10.0000 meq | EXTENDED_RELEASE_TABLET | Freq: Two times a day (BID) | ORAL | 1 refills | Status: DC

## 2020-09-22 NOTE — Telephone Encounter (Signed)
Per [name] the patient is scheduled for a BMP and I ordered the RX for potassium to take BID.  Sonal Dorwart,cma

## 2020-09-22 NOTE — Telephone Encounter (Signed)
-----   Message from Burnard Hawthorne, [place] sent at 09/19/2020  4:55 PM EST ----- CALL-  Potassium remains low. Increase potassium chloride 38meq to twice daily Please send new rx  Order and sch BMP in one week.   Let me know if cannot reach patient.

## 2020-09-22 NOTE — Telephone Encounter (Signed)
Patient called in for results from labs 

## 2020-09-23 NOTE — Telephone Encounter (Signed)
Called and spoke with the patient on yesterday and gave lab results.  Tonni Mansour,cma

## 2020-09-28 NOTE — Addendum Note (Signed)
Addended by: Leeanne Rio on: 09/28/2020 08:21 AM   Modules accepted: Orders

## 2020-09-28 NOTE — Telephone Encounter (Signed)
Reordered labs "future"

## 2020-09-30 ENCOUNTER — Other Ambulatory Visit: Payer: Self-pay

## 2020-09-30 ENCOUNTER — Other Ambulatory Visit (INDEPENDENT_AMBULATORY_CARE_PROVIDER_SITE_OTHER): Payer: Medicare Other

## 2020-09-30 DIAGNOSIS — E876 Hypokalemia: Secondary | ICD-10-CM | POA: Diagnosis not present

## 2020-09-30 LAB — BASIC METABOLIC PANEL
BUN: 24 mg/dL — ABNORMAL HIGH (ref 6–23)
CO2: 31 mEq/L (ref 19–32)
Calcium: 9.8 mg/dL (ref 8.4–10.5)
Chloride: 98 mEq/L (ref 96–112)
Creatinine, Ser: 1.09 mg/dL (ref 0.40–1.20)
GFR: 48.81 mL/min — ABNORMAL LOW (ref >60.00)
Glucose, Bld: 95 mg/dL (ref 70–99)
Potassium: 3.4 mEq/L — ABNORMAL LOW (ref 3.5–5.1)
Sodium: 138 mEq/L (ref 135–145)

## 2020-10-03 ENCOUNTER — Telehealth: Payer: Self-pay

## 2020-10-03 DIAGNOSIS — I10 Essential (primary) hypertension: Secondary | ICD-10-CM

## 2020-10-03 MED ORDER — LOSARTAN POTASSIUM 25 MG PO TABS: 25.0000 mg | ORAL_TABLET | Freq: Every day | ORAL | 2 refills | Status: DC

## 2020-10-03 NOTE — Telephone Encounter (Signed)
Call pt Her potassium remains low I would like to trial a different blood pressure medication as I suspect HCTZ is causing electrolyte imbalance  Is she willing stop hctz and start losartan 25mg  instead? We will start with 25mg  dose and then may have to increase to 50mg  losartan in one week if blood pressure not < 130/80.  Please order BMP and sch in one week.   Let me know about  Losartan and I can order. She would need to STOP both HCTZ AND potassium chloride as she would not need potassium if off the HCTZ. Please be sure that she understands that.

## 2020-10-03 NOTE — Telephone Encounter (Signed)
I called and spoke with the patient and informed her to stop the Potassium and the HCTZ and the provider is starting heron Losartan 25 mg and she understood patient also is scheduled for a lab appointment in one week.  Nina,cma

## 2020-10-03 NOTE — Telephone Encounter (Signed)
Call pt Losartan 25mg  has been sent She needs to STOP hctz 25mg  and potassium chloride

## 2020-10-03 NOTE — Addendum Note (Signed)
Addended by: Burnard Hawthorne on: 10/03/2020 04:09 PM   Modules accepted: Orders

## 2020-10-03 NOTE — Telephone Encounter (Signed)
Pt called about lab results. °

## 2020-10-03 NOTE — Telephone Encounter (Signed)
Patient has been notified

## 2020-10-03 NOTE — Telephone Encounter (Signed)
Pt called and states that she called pharmacy and they do not have medication yet. She wants to start this immediately and would like a call when it is sent in

## 2020-10-04 ENCOUNTER — Telehealth: Payer: Self-pay | Admitting: Family

## 2020-10-04 DIAGNOSIS — R300 Dysuria: Secondary | ICD-10-CM

## 2020-10-04 NOTE — Telephone Encounter (Signed)
Patient called in thinks she has a UTI wanted to come in for urine example

## 2020-10-04 NOTE — Telephone Encounter (Signed)
Called and spoke to [name]. Donna Ferguson states that she is experiencing Urinary pain and frequency and asks to come in tomorrow evening to drop off a urine Sample. She scheduled for a virtual visit on 10/07/2020 at 3:30pm. Ok to bring her in for a urine tomorrow afternoon? Will need to call and schedule for a urine drop off.

## 2020-10-05 ENCOUNTER — Other Ambulatory Visit: Payer: Medicare Other

## 2020-10-05 ENCOUNTER — Other Ambulatory Visit: Payer: Self-pay

## 2020-10-05 NOTE — Telephone Encounter (Signed)
Call pt Yes please order UA and urine culture  If symptoms warrant such as fever, worsening pain, please also advise patient to go to urgent care today

## 2020-10-05 NOTE — Addendum Note (Signed)
Addended by: Lars Masson on: 10/05/2020 02:14 PM   Modules accepted: Orders

## 2020-10-05 NOTE — Telephone Encounter (Signed)
Patient scheduled and coming in for urine at 2:45. Urine has been ordered.

## 2020-10-06 ENCOUNTER — Other Ambulatory Visit: Payer: Self-pay

## 2020-10-06 ENCOUNTER — Other Ambulatory Visit (INDEPENDENT_AMBULATORY_CARE_PROVIDER_SITE_OTHER): Payer: Medicare Other

## 2020-10-06 DIAGNOSIS — R300 Dysuria: Secondary | ICD-10-CM

## 2020-10-06 LAB — URINALYSIS, ROUTINE W REFLEX MICROSCOPIC
Bilirubin Urine: NEGATIVE
Hgb urine dipstick: NEGATIVE
Nitrite: NEGATIVE
RBC / HPF: NONE SEEN (ref 0–?)
Specific Gravity, Urine: 1.025 (ref 1.000–1.030)
Total Protein, Urine: NEGATIVE
Urine Glucose: NEGATIVE
Urobilinogen, UA: 0.2 (ref 0.0–1.0)
pH: 6 (ref 5.0–8.0)

## 2020-10-07 ENCOUNTER — Telehealth (INDEPENDENT_AMBULATORY_CARE_PROVIDER_SITE_OTHER): Payer: Medicare Other | Admitting: Family

## 2020-10-07 ENCOUNTER — Encounter: Payer: Self-pay | Admitting: Family

## 2020-10-07 DIAGNOSIS — R300 Dysuria: Secondary | ICD-10-CM | POA: Diagnosis not present

## 2020-10-07 DIAGNOSIS — I10 Essential (primary) hypertension: Secondary | ICD-10-CM

## 2020-10-07 NOTE — Assessment & Plan Note (Addendum)
She is off hctz, potassium. Compliant with amlodipine 5mg  , toprol 50mg . Follow up in one week.

## 2020-10-07 NOTE — Progress Notes (Signed)
Verbal consent for services obtained from patient prior to services given to TELEPHONE visit:   Location of call:  provider at work patient at home  Names of all persons present for services: [name], NP and patient  Chief complaint: dysuria has resolved with onset 6 days ago, improving. 'Im feeling much better' Urinary frequency continues, more so at night time however she thinks better since stopping hctz. Approx goes to the restroom 4x per hour at night. No urinary incontinence. She has urgency.  No fever, hematuria, N, vomiting, flank pain.   Started azo, cranberry supplements with improvement.   She has stopped hctz and potassium. She started losartan 4 days ago. She is compliant with toprol 50mg  and amlodipine.     A/P/next steps:  Problem List Items Addressed This Visit      Cardiovascular and Mediastinum   HTN (hypertension)    She is off hctz, potassium. Compliant with amlodipine 5mg  , toprol 50mg . Follow up in one week.        Other   Dysuria    UA is negative for RBCs. Urine culture negative. Symptoms improved. Patient will let me know of further concerns.           I spent 15 min  discussing plan of care over the phone.

## 2020-10-08 LAB — URINE CULTURE
MICRO NUMBER:: 11547352
Result:: NO GROWTH
SPECIMEN QUALITY:: ADEQUATE

## 2020-10-10 DIAGNOSIS — R300 Dysuria: Secondary | ICD-10-CM | POA: Insufficient documentation

## 2020-10-10 NOTE — Assessment & Plan Note (Signed)
UA is negative for RBCs. Urine culture negative. Symptoms improved. Patient will let me know of further concerns.

## 2020-10-13 ENCOUNTER — Other Ambulatory Visit: Payer: Medicare Other

## 2020-10-14 ENCOUNTER — Encounter: Payer: Self-pay | Admitting: Family

## 2020-10-14 ENCOUNTER — Ambulatory Visit (INDEPENDENT_AMBULATORY_CARE_PROVIDER_SITE_OTHER): Payer: Medicare Other | Admitting: Family

## 2020-10-14 ENCOUNTER — Other Ambulatory Visit: Payer: Self-pay

## 2020-10-14 VITALS — BP 130/80 | HR 76 | Temp 98.7°F | Ht 63.0 in | Wt 166.8 lb

## 2020-10-14 DIAGNOSIS — I10 Essential (primary) hypertension: Secondary | ICD-10-CM

## 2020-10-14 NOTE — Assessment & Plan Note (Addendum)
Improved and at goal < 130/80. Continue amlodipine 5mg , losartan 25mg , toprol 50mg .

## 2020-10-14 NOTE — Progress Notes (Signed)
Subjective:    Patient ID: Donna Ferguson, female    DOB: 01-16-1941, 80 y.o.   MRN: [id number]  CC: Donna Ferguson is a 80 y.o. female who presents today for follow up.   HPI: Feels well today No complaints.   HTN- Compliant with amlodipine 5mg  , toprol 50mg ; started losartan 25mg  2 weeks ago. At home, 135/80-85.  No cp, sob, leg swelling.     HISTORY:  Past Medical History:  Diagnosis Date  . Arthritis   . Chronic kidney disease    cyst near kidney  . Heart murmur    past  . History of colonoscopy    2019 in [place]; patient states no longer screening for colon cancer.  . Hypertension   . IBS (irritable bowel syndrome)   . Pre-diabetes   . Renal cyst    Past Surgical History:  Procedure Laterality Date  . ABDOMINAL HYSTERECTOMY     unsure if has ovaries.   Marland Kitchen BREAST EXCISIONAL BIOPSY Bilateral late 80s, early 90s    benign  . cyst N/A    cyst removal both breast  . JOINT REPLACEMENT Left    knee  . REPLACEMENT TOTAL KNEE  2017   Family History  Problem Relation Age of Onset  . Hypertension Mother   . Diabetes Father   . Hypertension Father   . Heart disease Father   . Hypertension Sister   . Hypertension Daughter     Allergies: Patient has no known allergies. Current Outpatient Medications on File Prior to Visit  Medication Sig Dispense Refill  . amLODipine (NORVASC) 5 MG tablet TAKE 1 TABLET(5 MG) BY MOUTH EVERY EVENING (Patient taking differently: 5 mg.) 90 tablet 1  . Ascorbic Acid (VITAMIN C) 1000 MG tablet Take 1,000 mg by mouth daily.    . Calcium Carbonate-Vit D-Min (CALTRATE 600+D PLUS PO) Take 1 tablet by mouth daily.    . clobetasol ointment (TEMOVATE) 0.05 % Apply to affected areas twice daily for 2 weeks then weekends only. Avoid applying to face, groin, and axilla. Use as directed. Risk of skin atrophy with long-term use reviewed. 30 g 0  . losartan (COZAAR) 25 MG tablet Take 1 tablet (25 mg total) by mouth daily. 90 tablet 2  . metoprolol  succinate (TOPROL-XL) 50 MG 24 hr tablet TAKE 1 TABLET(50 MG) BY MOUTH DAILY WITH OR IMMEDIATELY FOLLOWING A MEAL 90 tablet 1   No current facility-administered medications on file prior to visit.    Social History   Tobacco Use  . Smoking status: Never Smoker  . Smokeless tobacco: Never Used  Vaping Use  . Vaping Use: Never used  Substance Use Topics  . Alcohol use: Never  . Drug use: Never    Review of Systems  Constitutional: Negative for chills and fever.  Respiratory: Negative for cough.   Cardiovascular: Negative for chest pain and palpitations.  Gastrointestinal: Negative for nausea and vomiting.      Objective:    BP 130/80   Pulse 76   Temp 98.7 F (37.1 C)   Ht 5\' 3"  (1.6 m)   Wt 166 lb 12.8 oz (75.7 kg)   SpO2 97%   BMI 29.55 kg/m  BP Readings from Last 3 Encounters:  10/14/20 130/80  08/04/20 122/78  07/26/20 127/85   Wt Readings from Last 3 Encounters:  10/14/20 166 lb 12.8 oz (75.7 kg)  10/07/20 160 lb (72.6 kg)  08/04/20 164 lb 12.8 oz (74.8 kg)    Physical Exam Vitals  reviewed.  Constitutional:      Appearance: She is well-developed and well-nourished.  Eyes:     Conjunctiva/sclera: Conjunctivae normal.  Cardiovascular:     Rate and Rhythm: Normal rate and regular rhythm.     Pulses: Normal pulses.     Heart sounds: Normal heart sounds.  Pulmonary:     Effort: Pulmonary effort is normal.     Breath sounds: Normal breath sounds. No wheezing, rhonchi or rales.  Skin:    General: Skin is warm and dry.  Neurological:     Mental Status: She is alert.  Psychiatric:        Mood and Affect: Mood and affect normal.        Speech: Speech normal.        Behavior: Behavior normal.        Thought Content: Thought content normal.        Assessment & Plan:   Problem List Items Addressed This Visit      Cardiovascular and Mediastinum   HTN (hypertension) - Primary    Improved and at goal < 130/80. Continue amlodipine 5mg , losartan 25mg ,  toprol 50mg .       Relevant Orders   Basic metabolic panel       I am having Donna Ferguson maintain her vitamin C, amLODipine, clobetasol ointment, metoprolol succinate, Calcium Carbonate-Vit D-Min (CALTRATE 600+D PLUS PO), and losartan.   No orders of the defined types were placed in this encounter.   Return precautions given.   Risks, benefits, and alternatives of the medications and treatment plan prescribed today were discussed, and patient expressed understanding.   Education regarding symptom management and diagnosis given to patient on AVS.  Continue to follow with Donna Hawthorne, Donna Ferguson for routine health maintenance.   Donna Ferguson and I agreed with plan.   [name], Donna Ferguson

## 2020-10-15 LAB — BASIC METABOLIC PANEL
BUN/Creatinine Ratio: 20 (calc) (ref 6–22)
BUN: 22 mg/dL (ref 7–25)
CO2: 28 mmol/L (ref 20–32)
Calcium: 9.9 mg/dL (ref 8.6–10.4)
Chloride: 102 mmol/L (ref 98–110)
Creat: 1.12 mg/dL — ABNORMAL HIGH (ref 0.60–0.93)
Glucose, Bld: 111 mg/dL — ABNORMAL HIGH (ref 65–99)
Potassium: 4.7 mmol/L (ref 3.5–5.3)
Sodium: 141 mmol/L (ref 135–146)

## 2020-10-23 ENCOUNTER — Other Ambulatory Visit: Payer: Self-pay | Admitting: Family

## 2020-10-23 DIAGNOSIS — I10 Essential (primary) hypertension: Secondary | ICD-10-CM

## 2020-11-14 ENCOUNTER — Other Ambulatory Visit (INDEPENDENT_AMBULATORY_CARE_PROVIDER_SITE_OTHER): Payer: Medicare Other

## 2020-11-14 ENCOUNTER — Other Ambulatory Visit: Payer: Self-pay

## 2020-11-14 DIAGNOSIS — I10 Essential (primary) hypertension: Secondary | ICD-10-CM

## 2020-11-14 LAB — BASIC METABOLIC PANEL
BUN: 22 mg/dL (ref 6–23)
CO2: 28 mEq/L (ref 19–32)
Calcium: 9.5 mg/dL (ref 8.4–10.5)
Chloride: 105 mEq/L (ref 96–112)
Creatinine, Ser: 1.02 mg/dL (ref 0.40–1.20)
GFR: 52.81 mL/min — ABNORMAL LOW (ref >60.00)
Glucose, Bld: 97 mg/dL (ref 70–99)
Potassium: 4 mEq/L (ref 3.5–5.1)
Sodium: 140 mEq/L (ref 135–145)

## 2020-12-05 ENCOUNTER — Ambulatory Visit: Payer: Medicare Other | Admitting: Family

## 2020-12-07 ENCOUNTER — Encounter: Payer: Self-pay | Admitting: Family

## 2020-12-07 ENCOUNTER — Other Ambulatory Visit: Payer: Self-pay

## 2020-12-07 ENCOUNTER — Ambulatory Visit (INDEPENDENT_AMBULATORY_CARE_PROVIDER_SITE_OTHER): Payer: Medicare Other | Admitting: Family

## 2020-12-07 VITALS — BP 118/78 | HR 68 | Temp 98.1°F | Ht 63.0 in | Wt 160.0 lb

## 2020-12-07 DIAGNOSIS — R350 Frequency of micturition: Secondary | ICD-10-CM | POA: Diagnosis not present

## 2020-12-07 DIAGNOSIS — Z23 Encounter for immunization: Secondary | ICD-10-CM | POA: Diagnosis not present

## 2020-12-07 DIAGNOSIS — I10 Essential (primary) hypertension: Secondary | ICD-10-CM | POA: Diagnosis not present

## 2020-12-07 DIAGNOSIS — Z1231 Encounter for screening mammogram for malignant neoplasm of breast: Secondary | ICD-10-CM | POA: Diagnosis not present

## 2020-12-07 DIAGNOSIS — N3281 Overactive bladder: Secondary | ICD-10-CM | POA: Insufficient documentation

## 2020-12-07 LAB — URINALYSIS, ROUTINE W REFLEX MICROSCOPIC
Bilirubin Urine: NEGATIVE
Hgb urine dipstick: NEGATIVE
Ketones, ur: NEGATIVE
Leukocytes,Ua: NEGATIVE
Nitrite: NEGATIVE
RBC / HPF: NONE SEEN (ref 0–?)
Specific Gravity, Urine: <=1.005 — AB (ref 1.000–1.030)
Total Protein, Urine: NEGATIVE
Urine Glucose: NEGATIVE
Urobilinogen, UA: 0.2 (ref 0.0–1.0)
pH: 6.5 (ref 5.0–8.0)

## 2020-12-07 NOTE — Patient Instructions (Addendum)
Please get Tdap ( tetanus) at local pharmacy  Referral to urology Let us know if you dont hear back within a week in regards to an appointment being scheduled.

## 2020-12-07 NOTE — Addendum Note (Signed)
Addended by: Cheri Rous E on: 12/07/2020 12:11 PM   Modules accepted: Orders

## 2020-12-07 NOTE — Progress Notes (Signed)
Pt called and let her know I scheduled mammogram 5/4 @ 10:40.

## 2020-12-07 NOTE — Assessment & Plan Note (Signed)
Controlled. Continue amlodipine 5mg , losartan 25mg , toprol 50mg 

## 2020-12-07 NOTE — Progress Notes (Signed)
Subjective:    Patient ID: Donna Ferguson, female    DOB: Feb 26, 1941, 80 y.o.   MRN: [id number]  CC: Donna Ferguson is a 80 y.o. female who presents today for follow up.   HPI: Complains of urinary frequency, urgency for years, unchanged.  In the past she had followed with urology and would like new referral.    No urinary incontinence, hematuria, flank pain, dysuria, fever.   HTN- compliant with amlodipine 5mg , losartan 25mg , toprol 50mg . At home 119/75, 116/77. No cp, sob  She is not sure if/when she had pneumococcal vaccines. She would like to resume the series today.   Due PCV20.   3 weeks ago Crt 1.02 HISTORY:  Past Medical History:  Diagnosis Date  . Arthritis   . Chronic kidney disease    cyst near kidney  . Heart murmur    past  . History of colonoscopy    2019 in [place]; patient states no longer screening for colon cancer.  . Hypertension   . IBS (irritable bowel syndrome)   . Pre-diabetes   . Renal cyst    Past Surgical History:  Procedure Laterality Date  . ABDOMINAL HYSTERECTOMY     unsure if has ovaries.   Marland Kitchen BREAST EXCISIONAL BIOPSY Bilateral late 80s, early 90s    benign  . cyst N/A    cyst removal both breast  . JOINT REPLACEMENT Left    knee  . REPLACEMENT TOTAL KNEE  2017   Family History  Problem Relation Age of Onset  . Hypertension Mother   . Diabetes Father   . Hypertension Father   . Heart disease Father   . Hypertension Sister   . Hypertension Daughter     Allergies: Patient has no known allergies. Current Outpatient Medications on File Prior to Visit  Medication Sig Dispense Refill  . amLODipine (NORVASC) 5 MG tablet TAKE 1 TABLET(5 MG) BY MOUTH EVERY EVENING (Patient taking differently: 5 mg.) 90 tablet 1  . Ascorbic Acid (VITAMIN C) 1000 MG tablet Take 1,000 mg by mouth daily.    . Calcium Carbonate-Vit D-Min (CALTRATE 600+D PLUS PO) Take 1 tablet by mouth daily.    Marland Kitchen losartan (COZAAR) 25 MG tablet Take 1 tablet (25 mg total) by  mouth daily. 90 tablet 2  . metoprolol succinate (TOPROL-XL) 50 MG 24 hr tablet TAKE 1 TABLET(50 MG) BY MOUTH DAILY WITH OR IMMEDIATELY FOLLOWING A MEAL 90 tablet 1  . clobetasol ointment (TEMOVATE) 0.05 % Apply to affected areas twice daily for 2 weeks then weekends only. Avoid applying to face, groin, and axilla. Use as directed. Risk of skin atrophy with long-term use reviewed. (Patient not taking: Reported on 12/07/2020) 30 g 0   No current facility-administered medications on file prior to visit.    Social History   Tobacco Use  . Smoking status: Never Smoker  . Smokeless tobacco: Never Used  Vaping Use  . Vaping Use: Never used  Substance Use Topics  . Alcohol use: Never  . Drug use: Never    Review of Systems  Constitutional: Negative for chills and fever.  Respiratory: Negative for cough.   Cardiovascular: Negative for chest pain and palpitations.  Gastrointestinal: Negative for nausea and vomiting.  Genitourinary: Positive for urgency. Negative for dysuria.      Objective:    BP 118/78   Pulse 68   Temp 98.1 F (36.7 C)   Ht 5\' 3"  (1.6 m)   Wt 160 lb (72.6 kg)   SpO2  98%   BMI 28.34 kg/m  BP Readings from Last 3 Encounters:  12/07/20 118/78  10/14/20 130/80  08/04/20 122/78   Wt Readings from Last 3 Encounters:  12/07/20 160 lb (72.6 kg)  10/14/20 166 lb 12.8 oz (75.7 kg)  10/07/20 160 lb (72.6 kg)    Physical Exam Vitals reviewed.  Constitutional:      Appearance: She is well-developed.  Eyes:     Conjunctiva/sclera: Conjunctivae normal.  Cardiovascular:     Rate and Rhythm: Normal rate and regular rhythm.     Pulses: Normal pulses.     Heart sounds: Normal heart sounds.  Pulmonary:     Effort: Pulmonary effort is normal.     Breath sounds: Normal breath sounds. No wheezing, rhonchi or rales.  Skin:    General: Skin is warm and dry.  Neurological:     Mental Status: She is alert.  Psychiatric:        Speech: Speech normal.        Behavior:  Behavior normal.        Thought Content: Thought content normal.        Assessment & Plan:   Problem List Items Addressed This Visit      Cardiovascular and Mediastinum   HTN (hypertension)    Controlled. Continue amlodipine 5mg , losartan 25mg , toprol 50mg         Other   Urinary frequency    Symptoms c/w OAB. She declines trial of oxybutynin. Pending UA. Referral to urology      Relevant Orders   Ambulatory referral to Urology   Urinalysis, Routine w reflex microscopic    Other Visit Diagnoses    Encounter for screening mammogram for malignant neoplasm of breast    -  Primary   Relevant Orders   MM 3D SCREEN BREAST BILATERAL       I am having Donna Ferguson maintain her vitamin C, amLODipine, clobetasol ointment, metoprolol succinate, Calcium Carbonate-Vit D-Min (CALTRATE 600+D PLUS PO), and losartan.   No orders of the defined types were placed in this encounter.   Return precautions given.   Risks, benefits, and alternatives of the medications and treatment plan prescribed today were discussed, and patient expressed understanding.   Education regarding symptom management and diagnosis given to patient on AVS.  Continue to follow with Burnard Hawthorne, FNP for routine health maintenance.   Donna Ferguson and I agreed with plan.   Donna Paris, FNP

## 2020-12-07 NOTE — Assessment & Plan Note (Signed)
Symptoms c/w OAB. She declines trial of oxybutynin. Pending UA. Referral to urology

## 2020-12-15 ENCOUNTER — Ambulatory Visit (INDEPENDENT_AMBULATORY_CARE_PROVIDER_SITE_OTHER): Payer: Medicare Other | Admitting: Urology

## 2020-12-15 ENCOUNTER — Encounter: Payer: Self-pay | Admitting: Urology

## 2020-12-15 ENCOUNTER — Other Ambulatory Visit: Payer: Self-pay

## 2020-12-15 VITALS — BP 120/78 | HR 73 | Ht 63.0 in | Wt 160.0 lb

## 2020-12-15 DIAGNOSIS — N3281 Overactive bladder: Secondary | ICD-10-CM

## 2020-12-15 MED ORDER — OXYBUTYNIN CHLORIDE ER 10 MG PO TB24: 10.0000 mg | ORAL_TABLET | Freq: Every day | ORAL | 11 refills | Status: DC

## 2020-12-15 NOTE — Addendum Note (Signed)
Addended by: Despina Hidden on: 12/15/2020 01:41 PM   Modules accepted: Orders

## 2020-12-15 NOTE — Patient Instructions (Signed)
Overactive Bladder, Adult  Overactive bladder is a condition in which a person has a sudden and frequent need to urinate. A person might also leak urine if he or she cannot get to the bathroom fast enough (urinary incontinence). Sometimes, symptoms can interfere with work or social activities. What are the causes? Overactive bladder is associated with poor nerve signals between your bladder and your brain. Your bladder may get the signal to empty before it is full. You may also have very sensitive muscles that make your bladder squeeze too soon. This condition may also be caused by other factors, such as:  Medical conditions: ? Urinary tract infection. ? Infection of nearby tissues. ? Prostate enlargement. ? Bladder stones, inflammation, or tumors. ? Diabetes. ? Muscle or nerve weakness, especially from these conditions:  A spinal cord injury.  Stroke.  Multiple sclerosis.  Parkinson's disease.  Other causes: ? Surgery on the uterus or urethra. ? Drinking too much caffeine or alcohol. ? Certain medicines, especially those that eliminate extra fluid in the body (diuretics). ? Constipation. What increases the risk? You may be at greater risk for overactive bladder if you:  Are an older adult.  Smoke.  Are going through menopause.  Have prostate problems.  Have a neurological disease, such as stroke, dementia, Parkinson's disease, or multiple sclerosis (MS).  Eat or drink alcohol, spicy food, caffeine, and other things that irritate the bladder.  Are overweight or obese. What are the signs or symptoms? Symptoms of this condition include a sudden, strong urge to urinate. Other symptoms include:  Leaking urine.  Urinating 8 or more times a day.  Waking up to urinate 2 or more times overnight. How is this diagnosed? This condition may be diagnosed based on:  Your symptoms and medical history.  A physical exam.  Blood or urine tests to check for possible causes,  such as infection. You may also need to see a health care provider who specializes in urinary tract problems. This is called a urologist. How is this treated? Treatment for overactive bladder depends on the cause of your condition and whether it is mild or severe. Treatment may include:  Bladder training, such as: ? Learning to control the urge to urinate by following a schedule to urinate at regular intervals. ? Doing Kegel exercises to strengthen the pelvic floor muscles that support your bladder.  Special devices, such as: ? Biofeedback. This uses sensors to help you become aware of your body's signals. ? Electrical stimulation. This uses electrodes placed inside the body (implanted) or outside the body. These electrodes send gentle pulses of electricity to strengthen the nerves or muscles that control the bladder. ? Women may use a plastic device, called a pessary, that fits into the vagina and supports the bladder.  Medicines, such as: ? Antibiotics to treat bladder infection. ? Antispasmodics to stop the bladder from releasing urine at the wrong time. ? Tricyclic antidepressants to relax bladder muscles. ? Injections of botulinum toxin type A directly into the bladder tissue to relax bladder muscles.  Surgery, such as: ? A device may be implanted to help manage the nerve signals that control urination. ? An electrode may be implanted to stimulate electrical signals in the bladder. ? A procedure may be done to change the shape of the bladder. This is done only in very severe cases. Follow these instructions at home: Eating and drinking  Make diet or lifestyle changes recommended by your health care provider. These may include: ? Drinking fluids   throughout the day and not only with meals. ? Cutting down on caffeine or alcohol. ? Eating a healthy and balanced diet to prevent constipation. This may include:  Choosing foods that are high in fiber, such as beans, whole grains, and  fresh fruits and vegetables.  Limiting foods that are high in fat and processed sugars, such as fried and sweet foods.   Lifestyle  Lose weight if needed.  Do not use any products that contain nicotine or tobacco. These include cigarettes, chewing tobacco, and vaping devices, such as e-cigarettes. If you need help quitting, ask your health care provider.   General instructions  Take over-the-counter and prescription medicines only as told by your health care provider.  If you were prescribed an antibiotic medicine, take it as told by your health care provider. Do not stop taking the antibiotic even if you start to feel better.  Use any implants or pessary as told by your health care provider.  If needed, wear pads to absorb urine leakage.  Keep a log to track how much and when you drink, and when you need to urinate. This will help your health care provider monitor your condition.  Keep all follow-up visits. This is important. Contact a health care provider if:  You have a fever or chills.  Your symptoms do not get better with treatment.  Your pain and discomfort get worse.  You have more frequent urges to urinate. Get help right away if:  You are not able to control your bladder. Summary  Overactive bladder refers to a condition in which a person has a sudden and frequent need to urinate.  Several conditions may lead to an overactive bladder.  Treatment for overactive bladder depends on the cause and severity of your condition.  Making lifestyle changes, doing Kegel exercises, keeping a log, and taking medicines can help with this condition. This information is not intended to replace advice given to you by your health care provider. Make sure you discuss any questions you have with your health care provider. Document Revised: 04/25/2020 Document Reviewed: 04/25/2020 Elsevier Patient Education  2021 Elsevier Inc.  

## 2020-12-15 NOTE — Progress Notes (Signed)
   12/15/2020 1:24 PM   Donna Ferguson [birth year] [id number]  Reason for visit: New urinary symptoms, urgency/frequency, nocturia  HPI: 80 year old female who I have previously seen for simple renal cyst who reports new urinary frequency and urgency over the last 2 to 3 months.  She thinks this started after UTI.  She also has a new blood pressure medication losartan that is working very well for blood pressure control.  She drinks primarily water and cranberry juice during the day.  She occasionally has trouble with constipation.  She denies any dysuria or gross hematuria.  Recent urinalysis x2 with PCP was benign, and there was no microscopic hematuria.  Her primary complaint is the urgency during the day with having to rush to the bathroom, and nocturia 5-7 times overnight.  We discussed that overactive bladder (OAB) is not a disease, but is a symptom complex that is generally not life-threatening.  Symptoms typically include urinary urgency, frequency, and urge incontinence.  There are numerous treatment options, however there are risks and benefits with both medical and surgical management.  First-line treatment is behavioral therapies including bladder training, pelvic floor muscle training, and fluid management.  Second line treatments include oral antimuscarinics(Ditropan er, Trospium) and beta-3 agonist (Mybetriq). There is typically a period of medication trial (4-8 weeks) to find the optimal therapy and dosing. If symptoms are bothersome despite the above management, third line options include intra-detrusor botox, peripheral tibial nerve stimulation (PTNS), and interstim (SNS). These are more invasive treatments with higher side effect profile, but may improve quality of life for patients with severe OAB symptoms.   Trial of oxybutynin 10 mg XL daily for overactive bladder RTC 6 weeks symptom check  Billey Co, MD  [facility] [address], [place], [place] [address] [phone]

## 2020-12-16 LAB — URINALYSIS, COMPLETE
Bilirubin, UA: NEGATIVE
Glucose, UA: NEGATIVE
Ketones, UA: NEGATIVE
Leukocytes,UA: NEGATIVE
Nitrite, UA: NEGATIVE
RBC, UA: NEGATIVE
Specific Gravity, UA: 1.02 (ref 1.005–1.030)
Urobilinogen, Ur: 0.2 mg/dL (ref 0.2–1.0)
pH, UA: 6 (ref 5.0–7.5)

## 2020-12-16 LAB — MICROSCOPIC EXAMINATION: Bacteria, UA: NONE SEEN

## 2020-12-21 ENCOUNTER — Other Ambulatory Visit: Payer: Self-pay | Admitting: Family

## 2020-12-21 ENCOUNTER — Ambulatory Visit
Admission: RE | Admit: 2020-12-21 | Discharge: 2020-12-21 | Disposition: A | Discharge status: Home / Self Care | Payer: Medicare Other | Source: Ambulatory Visit | Attending: Family | Admitting: Family

## 2020-12-21 ENCOUNTER — Other Ambulatory Visit: Payer: Self-pay

## 2020-12-21 DIAGNOSIS — Z1231 Encounter for screening mammogram for malignant neoplasm of breast: Secondary | ICD-10-CM | POA: Diagnosis present

## 2020-12-21 DIAGNOSIS — R928 Other abnormal and inconclusive findings on diagnostic imaging of breast: Secondary | ICD-10-CM

## 2020-12-27 ENCOUNTER — Ambulatory Visit
Admission: RE | Admit: 2020-12-27 | Discharge: 2020-12-27 | Disposition: A | Discharge status: Home / Self Care | Payer: Medicare Other | Source: Ambulatory Visit | Attending: Family | Admitting: Family

## 2020-12-27 ENCOUNTER — Other Ambulatory Visit: Payer: Self-pay

## 2020-12-27 DIAGNOSIS — R928 Other abnormal and inconclusive findings on diagnostic imaging of breast: Secondary | ICD-10-CM | POA: Diagnosis not present

## 2021-01-09 ENCOUNTER — Other Ambulatory Visit: Payer: Self-pay | Admitting: Family

## 2021-01-09 DIAGNOSIS — I10 Essential (primary) hypertension: Secondary | ICD-10-CM

## 2021-01-26 ENCOUNTER — Ambulatory Visit: Payer: Medicare Other | Admitting: Urology

## 2021-02-22 ENCOUNTER — Other Ambulatory Visit: Payer: Self-pay | Admitting: Family

## 2021-04-14 ENCOUNTER — Encounter: Payer: Self-pay | Admitting: Cardiology

## 2021-04-14 ENCOUNTER — Ambulatory Visit (INDEPENDENT_AMBULATORY_CARE_PROVIDER_SITE_OTHER): Payer: Medicare Other | Admitting: Cardiology

## 2021-04-14 ENCOUNTER — Other Ambulatory Visit: Payer: Self-pay

## 2021-04-14 VITALS — BP 124/74 | HR 65 | Ht 63.0 in | Wt 159.0 lb

## 2021-04-14 DIAGNOSIS — I340 Nonrheumatic mitral (valve) insufficiency: Secondary | ICD-10-CM | POA: Diagnosis not present

## 2021-04-14 DIAGNOSIS — I10 Essential (primary) hypertension: Secondary | ICD-10-CM | POA: Diagnosis not present

## 2021-04-14 NOTE — Patient Instructions (Signed)
Medication Instructions:   Your physician recommends that you continue on your current medications as directed. Please refer to the Current Medication list given to you today.  *If you need a refill on your cardiac medications before your next appointment, please call your pharmacy*   Lab Work:  None Ordered  If you have labs (blood work) drawn today and your tests are completely normal, you will receive your results only by: [place] (if you have MyChart) OR A paper copy in the mail If you have any lab test that is abnormal or we need to change your treatment, we will call you to review the results.   Testing/Procedures:  None Ordered   Follow-Up: At [facility], you and your health needs are our priority.  As part of our continuing mission to provide you with exceptional heart care, we have created designated Provider Care Teams.  These Care Teams include your primary Cardiologist (physician) and Advanced Practice Providers (APPs -  Physician Assistants and Nurse Practitioners) who all work together to provide you with the care you need, when you need it.  We recommend signing up for the patient portal called "MyChart".  Sign up information is provided on this After Visit Summary.  MyChart is used to connect with patients for Virtual Visits (Telemedicine).  Patients are able to view lab/test results, encounter notes, upcoming appointments, etc.  Non-urgent messages can be sent to your provider as well.   To learn more about what you can do with MyChart, go to [email or web address].    Your next appointment:  AS NEEDED

## 2021-04-14 NOTE — Progress Notes (Signed)
Cardiology Office Note:    Date:  04/14/2021   ID:  Donna Ferguson, [place] April 26, 1941, MRN [id number]  PCP:  Burnard Hawthorne, FNP  Cardiologist:  Kate Sable, MD  Electrophysiologist:  None   Referring MD: Burnard Hawthorne, FNP   Chief Complaint  Patient presents with   Other    12 month follow up. Patient states when she eats cheese get gets palpitations. Meds reviewed verbally with patient.     History of Present Illness:    Donna Ferguson is a 80 y.o. female with a hx of hypertension, knee arthritis, trivial mitral valve insufficiency who presents for follow-up.    Previously seen for mitral valve disease, echocardiogram was performed showing normal systolic function, trivial MR.  Patient was made aware of results, reassured.  She is very active with gardening and working around her house.  Denies chest pain or shortness of breath.  Blood pressures are well controlled on current medications.  States having occasional palpitations when she sees, has no other concerns at this time.   Prior notes Echo 01/04/2020 EF 60 to 65%, impaired relaxation, trivial MR.   Past Medical History:  Diagnosis Date   Arthritis    Chronic kidney disease    cyst near kidney   Heart murmur    past   History of colonoscopy    2019 in [place]; patient states no longer screening for colon cancer.   Hypertension    IBS (irritable bowel syndrome)    Pre-diabetes    Renal cyst     Past Surgical History:  Procedure Laterality Date   ABDOMINAL HYSTERECTOMY     unsure if has ovaries.    BREAST EXCISIONAL BIOPSY Bilateral late 80s, early 90s    benign   cyst N/A    cyst removal both breast   JOINT REPLACEMENT Left    knee   REPLACEMENT TOTAL KNEE  2017    Current Medications: Current Meds  Medication Sig   amLODipine (NORVASC) 5 MG tablet TAKE 1 TABLET(5 MG) BY MOUTH EVERY EVENING   Ascorbic Acid (VITAMIN C) 1000 MG tablet Take 1,000 mg by mouth daily.   Calcium Carbonate-Vit D-Min  (CALTRATE 600+D PLUS PO) Take 1 tablet by mouth daily.   losartan (COZAAR) 25 MG tablet Take 1 tablet (25 mg total) by mouth daily.   metoprolol succinate (TOPROL-XL) 50 MG 24 hr tablet TAKE 1 TABLET(50 MG) BY MOUTH DAILY WITH OR IMMEDIATELY FOLLOWING A MEAL     Allergies:   Patient has no known allergies.   Social History   Socioeconomic History   Marital status: Married    Spouse name: Not on file   Number of children: 2   Years of education: Not on file   Highest education level: Not on file  Occupational History   Not on file  Tobacco Use   Smoking status: Never   Smokeless tobacco: Never  Vaping Use   Vaping Use: Never used  Substance and Sexual Activity   Alcohol use: Never   Drug use: Never   Sexual activity: Yes    Birth control/protection: Surgical, Post-menopausal  Other Topics Concern   Not on file  Social History Narrative   [employer] in [place]- Retired   2 children son & daughter   Married   From [place]   Retired 2011.    Enjoys painting   Social Determinants of Health   Financial Resource Strain: Low Risk    Difficulty of Paying  Living Expenses: Not hard at all  Food Insecurity: No Food Insecurity   Worried About [place] in the Last Year: Never true   Ran Out of Food in the Last Year: Never true  Transportation Needs: No Transportation Needs   Lack of Transportation (Medical): No   Lack of Transportation (Non-Medical): No  Physical Activity: Not on file  Stress: No Stress Concern Present   Feeling of Stress : Not at all  Social Connections: Unknown   Frequency of Communication with Friends and Family: Not on file   Frequency of Social Gatherings with Friends and Family: Not on file   Attends Religious Services: Not on file   Active Member of Clubs or Organizations: Not on file   Attends Archivist Meetings: Not on file   Marital Status: Married     Family History: The patient's family  history includes Diabetes in her father; Heart disease in her father; Hypertension in her daughter, father, mother, and sister. There is no history of Breast cancer.  ROS:   Please see the history of present illness.     All other systems reviewed and are negative.   EKGs/Labs/Other Studies Reviewed:    The following studies were reviewed today:   EKG:  EKG is  ordered today.  The ekg ordered today demonstrates sinus rhythm, normal ECG.  Recent Labs: 09/02/2020: ALT 15; Hemoglobin 13.2; Platelets 262.0; TSH 1.19 11/14/2020: BUN 22; Creatinine, Ser 1.02; Potassium 4.0; Sodium 140  Recent Lipid Panel    Component Value Date/Time   CHOL 158 09/02/2020 0907   TRIG 114.0 09/02/2020 0907   HDL 62.80 09/02/2020 0907   CHOLHDL 3 09/02/2020 0907   VLDL 22.8 09/02/2020 0907   LDLCALC 72 09/02/2020 0907    Physical Exam:    VS:  BP 124/74 (BP Location: Left Arm, Patient Position: Sitting, Cuff Size: Normal)   Pulse 65   Ht '5\' 3"'$  (1.6 m)   Wt 159 lb (72.1 kg)   SpO2 99%   BMI 28.17 kg/m     Wt Readings from Last 3 Encounters:  04/14/21 159 lb (72.1 kg)  12/15/20 160 lb (72.6 kg)  12/07/20 160 lb (72.6 kg)     GEN:  Well nourished, well developed in no acute distress HEENT: Normal NECK: No JVD; No carotid bruits LYMPHATICS: No lymphadenopathy CARDIAC: RRR, no murmurs, rubs, gallops RESPIRATORY:  Clear to auscultation without rales, wheezing or rhonchi  ABDOMEN: Soft, non-tender, non-distended MUSCULOSKELETAL:  No edema; No deformity  SKIN: Warm and dry NEUROLOGIC:  Alert and oriented x 3 PSYCHIATRIC:  Normal affect   ASSESSMENT:    1. Mitral valve insufficiency, unspecified etiology   2. Primary hypertension     PLAN:    In order of problems listed above:  History of mitral valve insufficiency.  Echocardiogram 12/2019 showing trivial MR, EF 60 to 65%.  Patient reassured.  Consider repeat echo if any new symptoms occur.  Otherwise we will plan to follow-up as needed  for trivial/physiologic MR. Hypertension, BP controlled.  Continue losartan, amlodipine.  Follow-up as needed  This note was generated in part or whole with voice recognition software. Voice recognition is usually quite accurate but there are transcription errors that can and very often do occur. I apologize for any typographical errors that were not detected and corrected.  Medication Adjustments/Labs and Tests Ordered: Current medicines are reviewed at length with the patient today.  Concerns regarding medicines are outlined above.  Orders Placed This Encounter  Procedures   EKG 12-Lead    No orders of the defined types were placed in this encounter.   Patient Instructions  Medication Instructions:   Your physician recommends that you continue on your current medications as directed. Please refer to the Current Medication list given to you today.  *If you need a refill on your cardiac medications before your next appointment, please call your pharmacy*   Lab Work:  None Ordered  If you have labs (blood work) drawn today and your tests are completely normal, you will receive your results only by: [place] (if you have MyChart) OR A paper copy in the mail If you have any lab test that is abnormal or we need to change your treatment, we will call you to review the results.   Testing/Procedures:  None Ordered   Follow-Up: At [facility], you and your health needs are our priority.  As part of our continuing mission to provide you with exceptional heart care, we have created designated Provider Care Teams.  These Care Teams include your primary Cardiologist (physician) and Advanced Practice Providers (APPs -  Physician Assistants and Nurse Practitioners) who all work together to provide you with the care you need, when you need it.  We recommend signing up for the patient portal called "MyChart".  Sign up information is provided on this After Visit Summary.  MyChart  is used to connect with patients for Virtual Visits (Telemedicine).  Patients are able to view lab/test results, encounter notes, upcoming appointments, etc.  Non-urgent messages can be sent to your provider as well.   To learn more about what you can do with MyChart, go to [email or web address].    Your next appointment:  AS NEEDED   Signed, Kate Sable, MD  04/14/2021 11:55 AM    [place]

## 2021-06-08 ENCOUNTER — Other Ambulatory Visit: Payer: Self-pay

## 2021-06-08 DIAGNOSIS — I10 Essential (primary) hypertension: Secondary | ICD-10-CM

## 2021-06-08 MED ORDER — LOSARTAN POTASSIUM 25 MG PO TABS: 25.0000 mg | ORAL_TABLET | Freq: Every day | ORAL | 1 refills | Status: DC

## 2021-06-10 ENCOUNTER — Other Ambulatory Visit: Payer: Self-pay | Admitting: Family

## 2021-06-14 ENCOUNTER — Ambulatory Visit (INDEPENDENT_AMBULATORY_CARE_PROVIDER_SITE_OTHER): Payer: Medicare Other

## 2021-06-14 ENCOUNTER — Other Ambulatory Visit: Payer: Self-pay | Admitting: Family

## 2021-06-14 ENCOUNTER — Other Ambulatory Visit: Payer: Self-pay

## 2021-06-14 DIAGNOSIS — I10 Essential (primary) hypertension: Secondary | ICD-10-CM

## 2021-06-14 DIAGNOSIS — Z23 Encounter for immunization: Secondary | ICD-10-CM

## 2021-07-27 ENCOUNTER — Ambulatory Visit (INDEPENDENT_AMBULATORY_CARE_PROVIDER_SITE_OTHER): Payer: Medicare Other

## 2021-07-27 VITALS — BP 124/82 | HR 71 | Ht 63.0 in | Wt 159.0 lb

## 2021-07-27 DIAGNOSIS — Z0000 Encounter for general adult medical examination without abnormal findings: Secondary | ICD-10-CM | POA: Diagnosis not present

## 2021-07-27 NOTE — Patient Instructions (Addendum)
Donna Ferguson , Thank you for taking time to come for your Medicare Wellness Visit. I appreciate your ongoing commitment to your health goals. Please review the following plan we discussed and let me know if I can assist you in the future.   These are the goals we discussed:  Goals      Maintain Healthy Lifestyle     Stay active Healthy diet        This is a list of the screening recommended for you and due dates:  Health Maintenance  Topic Date Due   COVID-19 Vaccine (5 - Booster for Pfizer series) 08/12/2021*   Tetanus Vaccine  07/27/2022*   Hepatitis C Screening: USPSTF Recommendation to screen - Ages 18-79 yo.  07/27/2022*   Pneumonia Vaccine  Completed   Flu Shot  Completed   DEXA scan (bone density measurement)  Completed   Zoster (Shingles) Vaccine  Completed   HPV Vaccine  Aged Out  *Topic was postponed. The date shown is not the original due date.    Advanced directives: not yet completed  Conditions/risks identified: none new  Next appointment: Follow up in one year for your annual wellness visit    Preventive Care 65 Years and Older, Female Preventive care refers to lifestyle choices and visits with your health care provider that can promote health and wellness. What does preventive care include? A yearly physical exam. This is also called an annual well check. Dental exams once or twice a year. Routine eye exams. Ask your health care provider how often you should have your eyes checked. Personal lifestyle choices, including: Daily care of your teeth and gums. Regular physical activity. Eating a healthy diet. Avoiding tobacco and drug use. Limiting alcohol use. Practicing safe sex. Taking low-dose aspirin every day. Taking vitamin and mineral supplements as recommended by your health care provider. What happens during an annual well check? The services and screenings done by your health care provider during your annual well check will depend on your age,  overall health, lifestyle risk factors, and family history of disease. Counseling  Your health care provider may ask you questions about your: Alcohol use. Tobacco use. Drug use. Emotional well-being. Home and relationship well-being. Sexual activity. Eating habits. History of falls. Memory and ability to understand (cognition). Work and work Statistician. Reproductive health. Screening  You may have the following tests or measurements: Height, weight, and BMI. Blood pressure. Lipid and cholesterol levels. These may be checked every 5 years, or more frequently if you are over 15 years old. Skin check. Lung cancer screening. You may have this screening every year starting at age 47 if you have a 30-pack-year history of smoking and currently smoke or have quit within the past 15 years. Fecal occult blood test (FOBT) of the stool. You may have this test every year starting at age 33. Flexible sigmoidoscopy or colonoscopy. You may have a sigmoidoscopy every 5 years or a colonoscopy every 10 years starting at age 30. Hepatitis C blood test. Hepatitis B blood test. Sexually transmitted disease (STD) testing. Diabetes screening. This is done by checking your blood sugar (glucose) after you have not eaten for a while (fasting). You may have this done every 1-3 years. Bone density scan. This is done to screen for osteoporosis. You may have this done starting at age 63. Mammogram. This may be done every 1-2 years. Talk to your health care provider about how often you should have regular mammograms. Talk with your health care provider about  your test results, treatment options, and if necessary, the need for more tests. Vaccines  Your health care provider may recommend certain vaccines, such as: Influenza vaccine. This is recommended every year. Tetanus, diphtheria, and acellular pertussis (Tdap, Td) vaccine. You may need a Td booster every 10 years. Zoster vaccine. You may need this after age  31. Pneumococcal 13-valent conjugate (PCV13) vaccine. One dose is recommended after age 45. Pneumococcal polysaccharide (PPSV23) vaccine. One dose is recommended after age 35. Talk to your health care provider about which screenings and vaccines you need and how often you need them. This information is not intended to replace advice given to you by your health care provider. Make sure you discuss any questions you have with your health care provider. Document Released: 09/02/2015 Document Revised: 04/25/2016 Document Reviewed: 06/07/2015 Elsevier Interactive Patient Education  2017 [place] Prevention in the Home Falls can cause injuries. They can happen to people of all ages. There are many things you can do to make your home safe and to help prevent falls. What can I do on the outside of my home? Regularly fix the edges of walkways and driveways and fix any cracks. Remove anything that might make you trip as you walk through a door, such as a raised step or threshold. Trim any bushes or trees on the path to your home. Use bright outdoor lighting. Clear any walking paths of anything that might make someone trip, such as rocks or tools. Regularly check to see if handrails are loose or broken. Make sure that both sides of any steps have handrails. Any raised decks and porches should have guardrails on the edges. Have any leaves, snow, or ice cleared regularly. Use sand or salt on walking paths during winter. Clean up any spills in your garage right away. This includes oil or grease spills. What can I do in the bathroom? Use night lights. Install grab bars by the toilet and in the tub and shower. Do not use towel bars as grab bars. Use non-skid mats or decals in the tub or shower. If you need to sit down in the shower, use a plastic, non-slip stool. Keep the floor dry. Clean up any water that spills on the floor as soon as it happens. Remove soap buildup in the tub or shower  regularly. Attach bath mats securely with double-sided non-slip rug tape. Do not have throw rugs and other things on the floor that can make you trip. What can I do in the bedroom? Use night lights. Make sure that you have a light by your bed that is easy to reach. Do not use any sheets or blankets that are too big for your bed. They should not hang down onto the floor. Have a firm chair that has side arms. You can use this for support while you get dressed. Do not have throw rugs and other things on the floor that can make you trip. What can I do in the kitchen? Clean up any spills right away. Avoid walking on wet floors. Keep items that you use a lot in easy-to-reach places. If you need to reach something above you, use a strong step stool that has a grab bar. Keep electrical cords out of the way. Do not use floor polish or wax that makes floors slippery. If you must use wax, use non-skid floor wax. Do not have throw rugs and other things on the floor that can make you trip. What can I do with  my stairs? Do not leave any items on the stairs. Make sure that there are handrails on both sides of the stairs and use them. Fix handrails that are broken or loose. Make sure that handrails are as long as the stairways. Check any carpeting to make sure that it is firmly attached to the stairs. Fix any carpet that is loose or worn. Avoid having throw rugs at the top or bottom of the stairs. If you do have throw rugs, attach them to the floor with carpet tape. Make sure that you have a light switch at the top of the stairs and the bottom of the stairs. If you do not have them, ask someone to add them for you. What else can I do to help prevent falls? Wear shoes that: Do not have high heels. Have rubber bottoms. Are comfortable and fit you well. Are closed at the toe. Do not wear sandals. If you use a stepladder: Make sure that it is fully opened. Do not climb a closed stepladder. Make sure that  both sides of the stepladder are locked into place. Ask someone to hold it for you, if possible. Clearly mark and make sure that you can see: Any grab bars or handrails. First and last steps. Where the edge of each step is. Use tools that help you move around (mobility aids) if they are needed. These include: Canes. Walkers. Scooters. Crutches. Turn on the lights when you go into a dark area. Replace any light bulbs as soon as they burn out. Set up your furniture so you have a clear path. Avoid moving your furniture around. If any of your floors are uneven, fix them. If there are any pets around you, be aware of where they are. Review your medicines with your doctor. Some medicines can make you feel dizzy. This can increase your chance of falling. Ask your doctor what other things that you can do to help prevent falls. This information is not intended to replace advice given to you by your health care provider. Make sure you discuss any questions you have with your health care provider. Document Released: 06/02/2009 Document Revised: 01/12/2016 Document Reviewed: 09/10/2014 Elsevier Interactive Patient Education  2017 [employer].

## 2021-07-27 NOTE — Progress Notes (Signed)
Subjective:   Donna Ferguson is a 80 y.o. female who presents for Medicare Annual (Subsequent) preventive examination.  Review of Systems    No ROS.  Medicare Wellness Virtual Visit.  Visual/audio telehealth visit, UTA vital signs.   See social history for additional risk factors.   Cardiac Risk Factors include: advanced age (>75men, >22 women);hypertension     Objective:    Today's Vitals   07/27/21 1451  BP: 124/82  Pulse: 71  Weight: 159 lb (72.1 kg)  Height: 5\' 3"  (1.6 m)   Body mass index is 28.17 kg/m.  Advanced Directives 07/26/2020 12/25/2019  Does Patient Have a Medical Advance Directive? No No  Would patient like information on creating a medical advance directive? No - Patient declined No - Patient declined    Current Medications (verified) Outpatient Encounter Medications as of 07/27/2021  Medication Sig   amLODipine (NORVASC) 5 MG tablet TAKE 1 TABLET(5 MG) BY MOUTH EVERY EVENING   Ascorbic Acid (VITAMIN C) 1000 MG tablet Take 1,000 mg by mouth daily.   Calcium Carbonate-Vit D-Min (CALTRATE 600+D PLUS PO) Take 1 tablet by mouth daily.   losartan (COZAAR) 25 MG tablet Take 1 tablet (25 mg total) by mouth daily.   metoprolol succinate (TOPROL-XL) 50 MG 24 hr tablet TAKE 1 TABLET(50 MG) BY MOUTH DAILY WITH OR IMMEDIATELY FOLLOWING A MEAL   No facility-administered encounter medications on file as of 07/27/2021.    Allergies (verified) Patient has no known allergies.   History: Past Medical History:  Diagnosis Date   Arthritis    Chronic kidney disease    cyst near kidney   Heart murmur    past   History of colonoscopy    2019 in [place]; patient states no longer screening for colon cancer.   Hypertension    IBS (irritable bowel syndrome)    Pre-diabetes    Renal cyst    Past Surgical History:  Procedure Laterality Date   ABDOMINAL HYSTERECTOMY     unsure if has ovaries.    BREAST EXCISIONAL BIOPSY Bilateral late 80s, early 90s    benign   cyst N/A     cyst removal both breast   JOINT REPLACEMENT Left    knee   REPLACEMENT TOTAL KNEE  2017   Family History  Problem Relation Age of Onset   Hypertension Mother    Diabetes Father    Hypertension Father    Heart disease Father    Hypertension Sister    Throat cancer Sister    Hypertension Daughter    Breast cancer Neg Hx    Social History   Socioeconomic History   Marital status: Married    Spouse name: Not on file   Number of children: 2   Years of education: Not on file   Highest education level: Not on file  Occupational History   Not on file  Tobacco Use   Smoking status: Never   Smokeless tobacco: Never  Vaping Use   Vaping Use: Never used  Substance and Sexual Activity   Alcohol use: Never   Drug use: Never   Sexual activity: Yes    Birth control/protection: Surgical, Post-menopausal  Other Topics Concern   Not on file  Social History Narrative   [place] in [place]- Retired   2 children son & daughter   Married   From [place]   Retired 2011.    Enjoys painting   Social Determinants of Radio broadcast assistant  Strain: Low Risk    Difficulty of Paying Living Expenses: Not hard at all  Food Insecurity: No Food Insecurity   Worried About Charity fundraiser in the Last Year: Never true   Ran Out of Food in the Last Year: Never true  Transportation Needs: No Transportation Needs   Lack of Transportation (Medical): No   Lack of Transportation (Non-Medical): No  Physical Activity: Not on file  Stress: No Stress Concern Present   Feeling of Stress : Not at all  Social Connections: Unknown   Frequency of Communication with Friends and Family: Not on file   Frequency of Social Gatherings with Friends and Family: Not on file   Attends Religious Services: Not on file   Active Member of Clubs or Organizations: Not on file   Attends Archivist Meetings: Not on file   Marital Status: Married    Tobacco  Counseling Counseling given: Not Answered   Clinical Intake:  Pre-visit preparation completed: Yes        Diabetes: No  How often do you need to have someone help you when you read instructions, pamphlets, or other written materials from your doctor or pharmacy?: 1 - Never  Interpreter Needed?: No      Activities of Daily Living In your present state of health, do you have any difficulty performing the following activities: 07/27/2021  Hearing? N  Vision? N  Difficulty concentrating or making decisions? N  Walking or climbing stairs? N  Dressing or bathing? N  Doing errands, shopping? Y  Comment She does not drive or run Science writer and eating ? N  Using the Toilet? N  In the past six months, have you accidently leaked urine? N  Do you have problems with loss of bowel control? N  Managing your Medications? N  Managing your Finances? N  Housekeeping or managing your Housekeeping? N  Some recent data might be hidden    Patient Care Team: Burnard Hawthorne, FNP as PCP - General (Family Medicine) Kate Sable, MD as PCP - Cardiology (Cardiology)  Indicate any recent Medical Services you may have received from other than [facility] providers in the past year (date may be approximate).     Assessment:   This is a routine wellness examination for Donna Ferguson.  Virtual Visit via Telephone Note  I connected with  Donna Ferguson on 07/27/21 at  2:45 PM EST by telephone and verified that I am speaking with the correct person using two identifiers.  Persons participating in the virtual visit: patient/Nurse Health Advisor   I discussed the limitations, risks, security and privacy concerns of performing an evaluation and management service by telephone and the availability of in person appointments. The patient expressed understanding and agreed to proceed.  Interactive audio and video telecommunications were attempted between this nurse and patient, however failed,  due to patient having technical difficulties OR patient did not have access to video capability.  We continued and completed visit with audio only.  Some vital signs may be absent or patient reported.   Hearing/Vision screen Hearing Screening - Comments:: Patient is able to hear conversational tones without difficulty. No issues reported. Vision Screening - Comments:: Wears corrective lenses  They have seen their ophthalmologist in the last 12 months.   Dietary issues and exercise activities discussed: Current Exercise Habits: Home exercise routine, Intensity: Mild Low fat diet Good water intake   Goals Addressed  This Visit's Progress    Maintain Healthy Lifestyle       Stay active Healthy diet       Depression Screen PHQ 2/9 Scores 07/27/2021 10/07/2020 07/26/2020 02/09/2020 10/28/2019 05/04/2019  PHQ - 2 Score 0 0 0 0 0 0  PHQ- 9 Score - - - - 0 0    Fall Risk Fall Risk  07/27/2021 10/07/2020 07/26/2020 02/09/2020 01/13/2020  Falls in the past year? 1 0 0 0 0  Number falls in past yr: 0 0 0 0 -  Injury with Fall? - 0 0 0 -  Follow up Falls evaluation completed Falls evaluation completed Falls evaluation completed Falls evaluation completed Falls evaluation completed    [place]: Home free of loose throw rugs in walkways, pet beds, electrical cords, etc? Yes  Adequate lighting in your home to reduce risk of falls? Yes   ASSISTIVE DEVICES UTILIZED TO PREVENT FALLS: Life alert? No  Use of a cane, walker or w/c? No  Grab bars in the bathroom? Yes  Shower chair or bench in shower? No  Comfort height toilet? Yes   TIMED UP AND GO: Was the test performed? No .   Cognitive Function:  Patient is alert and oriented x3.   Enjoys reading.     Immunizations Immunization History  Administered Date(s) Administered   Fluad Quad(high Dose 65+) 05/04/2019, 06/14/2021   Influenza, High Dose Seasonal PF 06/03/2020   PFIZER(Purple  Top)SARS-COV-2 Vaccination 09/22/2019, 10/13/2019, 05/18/2020, 03/13/2021   PNEUMOCOCCAL CONJUGATE-20 12/07/2020   Zoster Recombinat (Shingrix) 01/03/2018, 11/21/2019   TDAP status: Due, Education has been provided regarding the importance of this vaccine. Advised may receive this vaccine at local pharmacy or Health Dept. Aware to provide a copy of the vaccination record if obtained from local pharmacy or Health Dept. Verbalized acceptance and understanding. Deferred.   Screening Tests Health Maintenance  Topic Date Due   COVID-19 Vaccine (5 - Booster for Pfizer series) 08/12/2021 (Originally 05/08/2021)   TETANUS/TDAP  07/27/2022 (Originally 09/03/1959)   Hepatitis C Screening  07/27/2022 (Originally 09/03/1958)   Pneumonia Vaccine 88+ Years old  Completed   INFLUENZA VACCINE  Completed   DEXA SCAN  Completed   Zoster Vaccines- Shingrix  Completed   HPV VACCINES  Aged Out   Health Maintenance There are no preventive care reminders to display for this patient.  Mammogram- 12/2020.   Lung Cancer Screening: (Low Dose CT Chest recommended if Age 60-80 years, 30 pack-year currently smoking OR have quit w/in 15years.) does not qualify.   Hepatitis C Screening: does not qualify  Vision Screening: Recommended annual ophthalmology exams for early detection of glaucoma and other disorders of the eye.  Dental Screening: Recommended annual dental exams for proper oral hygiene  Community Resource Referral / Chronic Care Management: CRR required this visit?  No   CCM required this visit?  No      Plan:   Keep all routine maintenance appointments.   I have personally reviewed and noted the following in the patient's chart:   Medical and social history Use of alcohol, tobacco or illicit drugs  Current medications and supplements including opioid prescriptions. Not taking opioid.  Functional ability and status Nutritional status Physical activity Advanced directives List of other  physicians Hospitalizations, surgeries, and ER visits in previous 12 months Vitals Screenings to include cognitive, depression, and falls Referrals and appointments  In addition, I have reviewed and discussed with patient certain preventive protocols, quality metrics, and best  practice recommendations. A written personalized care plan for preventive services as well as general preventive health recommendations were provided to patient.     Varney Biles, LPN   [id number]

## 2021-09-18 ENCOUNTER — Encounter: Payer: Self-pay | Admitting: Family

## 2021-09-18 ENCOUNTER — Ambulatory Visit (INDEPENDENT_AMBULATORY_CARE_PROVIDER_SITE_OTHER): Payer: Medicare Other | Admitting: Family

## 2021-09-18 ENCOUNTER — Other Ambulatory Visit: Payer: Self-pay

## 2021-09-18 VITALS — BP 134/84 | HR 64 | Temp 98.5°F | Resp 12 | Ht 63.0 in | Wt 164.2 lb

## 2021-09-18 DIAGNOSIS — R350 Frequency of micturition: Secondary | ICD-10-CM

## 2021-09-18 DIAGNOSIS — R7303 Prediabetes: Secondary | ICD-10-CM | POA: Diagnosis not present

## 2021-09-18 DIAGNOSIS — I10 Essential (primary) hypertension: Secondary | ICD-10-CM

## 2021-09-18 DIAGNOSIS — M79642 Pain in left hand: Secondary | ICD-10-CM

## 2021-09-18 DIAGNOSIS — Z1231 Encounter for screening mammogram for malignant neoplasm of breast: Secondary | ICD-10-CM

## 2021-09-18 DIAGNOSIS — Z135 Encounter for screening for eye and ear disorders: Secondary | ICD-10-CM

## 2021-09-18 DIAGNOSIS — Z87898 Personal history of other specified conditions: Secondary | ICD-10-CM

## 2021-09-18 DIAGNOSIS — M85861 Other specified disorders of bone density and structure, right lower leg: Secondary | ICD-10-CM

## 2021-09-18 DIAGNOSIS — Z780 Asymptomatic menopausal state: Secondary | ICD-10-CM

## 2021-09-18 DIAGNOSIS — Z13820 Encounter for screening for osteoporosis: Secondary | ICD-10-CM | POA: Diagnosis not present

## 2021-09-18 DIAGNOSIS — M8580 Other specified disorders of bone density and structure, unspecified site: Secondary | ICD-10-CM

## 2021-09-18 DIAGNOSIS — M79641 Pain in right hand: Secondary | ICD-10-CM

## 2021-09-18 LAB — CBC WITH DIFFERENTIAL/PLATELET
Basophils Absolute: 0 10*3/uL (ref 0.0–0.1)
Basophils Relative: 0.4 % (ref 0.0–3.0)
Eosinophils Absolute: 0 10*3/uL (ref 0.0–0.7)
Eosinophils Relative: 0.3 % (ref 0.0–5.0)
HCT: 42.4 % (ref 36.0–46.0)
Hemoglobin: 13.6 g/dL (ref 12.0–15.0)
Lymphocytes Relative: 25.4 % (ref 12.0–46.0)
Lymphs Abs: 2.2 10*3/uL (ref 0.7–4.0)
MCHC: 32.1 g/dL (ref 30.0–36.0)
MCV: 90.5 fl (ref 78.0–100.0)
Monocytes Absolute: 0.7 10*3/uL (ref 0.1–1.0)
Monocytes Relative: 8 % (ref 3.0–12.0)
Neutro Abs: 5.6 10*3/uL (ref 1.4–7.7)
Neutrophils Relative %: 65.9 % (ref 43.0–77.0)
Platelets: 244 10*3/uL (ref 150.0–400.0)
RBC: 4.69 Mil/uL (ref 3.87–5.11)
RDW: 14.4 % (ref 11.5–15.5)
WBC: 8.6 10*3/uL (ref 4.0–10.5)

## 2021-09-18 LAB — URINALYSIS, ROUTINE W REFLEX MICROSCOPIC
Bilirubin Urine: NEGATIVE
Ketones, ur: NEGATIVE
Nitrite: NEGATIVE
Specific Gravity, Urine: 1.01 (ref 1.000–1.030)
Total Protein, Urine: NEGATIVE
Urine Glucose: NEGATIVE
Urobilinogen, UA: 0.2 (ref 0.0–1.0)
pH: 6.5 (ref 5.0–8.0)

## 2021-09-18 LAB — LIPID PANEL
Cholesterol: 180 mg/dL (ref 0–200)
HDL: 70.6 mg/dL (ref >39.00)
LDL Cholesterol: 94 mg/dL (ref 0–99)
NonHDL: 109.58
Total CHOL/HDL Ratio: 3
Triglycerides: 77 mg/dL (ref 0.0–149.0)
VLDL: 15.4 mg/dL (ref 0.0–40.0)

## 2021-09-18 LAB — COMPREHENSIVE METABOLIC PANEL
ALT: 12 U/L (ref 0–35)
AST: 17 U/L (ref 0–37)
Albumin: 4 g/dL (ref 3.5–5.2)
Alkaline Phosphatase: 85 U/L (ref 39–117)
BUN: 20 mg/dL (ref 6–23)
CO2: 26 mEq/L (ref 19–32)
Calcium: 9.7 mg/dL (ref 8.4–10.5)
Chloride: 103 mEq/L (ref 96–112)
Creatinine, Ser: 1.16 mg/dL (ref 0.40–1.20)
GFR: 44.99 mL/min — ABNORMAL LOW (ref >60.00)
Glucose, Bld: 87 mg/dL (ref 70–99)
Potassium: 3.9 mEq/L (ref 3.5–5.1)
Sodium: 140 mEq/L (ref 135–145)
Total Bilirubin: 0.5 mg/dL (ref 0.2–1.2)
Total Protein: 6.9 g/dL (ref 6.0–8.3)

## 2021-09-18 LAB — TSH: TSH: 1.04 u[IU]/mL (ref 0.35–5.50)

## 2021-09-18 LAB — VITAMIN D 25 HYDROXY (VIT D DEFICIENCY, FRACTURES): VITD: 80.04 ng/mL (ref 30.00–100.00)

## 2021-09-18 LAB — HEMOGLOBIN A1C: Hgb A1c MFr Bld: 6.3 % (ref 4.6–6.5)

## 2021-09-18 MED ORDER — OXYBUTYNIN CHLORIDE ER 10 MG PO TB24: 20.0000 mg | ORAL_TABLET | Freq: Every day | ORAL | 1 refills | Status: DC

## 2021-09-18 NOTE — Assessment & Plan Note (Signed)
Benign hand exam.  No deformities, swelling.  Advised over-the-counter Voltaren gel and massage, stretching prior to cooking and peeling vegetables.  She will let me know how she is doing

## 2021-09-18 NOTE — Assessment & Plan Note (Signed)
Urinary frequency, urgency, nocturia.  We agreed to retry oxybutynin at higher dose as she previously been on 10 mg.  She has a full bottle of oxybutynin 10 mg, jointly agreed to increase to 20 mg. New rx sent as well.  Pending urine studies to exclude infection and have advised patient not to restart oxybutynin until urine studies have resulted.  Consider evaluation for OSA at follow up.  If no resolution of symptoms, we agreed to arrange consult with urology, Dr. Versie Starks again

## 2021-09-18 NOTE — Patient Instructions (Addendum)
Start voltaren gel over the counter for hand cramping.  Please let me know how you are doing  Once urine results, we are sure there is no infection, you may increase the oxybutynin that you have  from 10 mg daily to 20 mg taken once daily. Please see new prescription  Mammogram and bone density as scheduled.  Referral to [facility] ophthalmology Let us know if you dont hear back within a week in regards to an appointment being scheduled.

## 2021-09-18 NOTE — Progress Notes (Signed)
Subjective:    Patient ID: Donna Ferguson, female    DOB: Dec 29, 1940, 81 y.o.   MRN: [id number]  CC: Donna Ferguson is a 81 y.o. female who presents today for follow up.   HPI: Complaints of bilateral hand cramping , has been going on for months. Increasing in frequency. More noticeable at night. No pain today. No swelling, numbness, rash. Will massage and will resolve.  She has noticed when she cooking daily such as peeling.    She continues to complains of urinary frequency. Worse at night. Over the weekend she started azo as wasn't sure if an infection.  No fever, hematuria.  She had tried oxybutynin ER 10 mg in the past without relief.  She does not recall experiencing dry mouth, constipation or medication    She requests eye appointment   Hypertension-compliant with Norvasc 5 mg, losartan 25 mg, Toprol 50 mg. No cp, sob, dizziness. At home 120/80.   Consult with cardiology Dr. Charlestine Night 04/14/2021 due to history of mitral valve insufficiency.  Echocardiogram 01/07/2020 showing trivial MR, EF 60 to 65% . consider echo if any new symptoms occur. F/u prn.    Mammogram up-to-date DEXA due   HISTORY:  Past Medical History:  Diagnosis Date   Arthritis    Chronic kidney disease    cyst near kidney   Heart murmur    past   History of colonoscopy    2019 in [place]; patient states no longer screening for colon cancer.   Hypertension    IBS (irritable bowel syndrome)    Pre-diabetes    Renal cyst    Past Surgical History:  Procedure Laterality Date   ABDOMINAL HYSTERECTOMY     unsure if has ovaries.    BREAST EXCISIONAL BIOPSY Bilateral late 80s, early 90s    benign   cyst N/A    cyst removal both breast   JOINT REPLACEMENT Left    knee   REPLACEMENT TOTAL KNEE  2017   Family History  Problem Relation Age of Onset   Hypertension Mother    Diabetes Father    Hypertension Father    Heart disease Father    Hypertension Sister    Throat cancer Sister     Hypertension Daughter    Breast cancer Neg Hx     Allergies: Patient has no known allergies. Current Outpatient Medications on File Prior to Visit  Medication Sig Dispense Refill   amLODipine (NORVASC) 5 MG tablet TAKE 1 TABLET(5 MG) BY MOUTH EVERY EVENING 90 tablet 1   Ascorbic Acid (VITAMIN C) 1000 MG tablet Take 1,000 mg by mouth daily.     Calcium Carbonate-Vit D-Min (CALTRATE 600+D PLUS PO) Take 1 tablet by mouth daily.     losartan (COZAAR) 25 MG tablet Take 1 tablet (25 mg total) by mouth daily. 90 tablet 1   metoprolol succinate (TOPROL-XL) 50 MG 24 hr tablet TAKE 1 TABLET(50 MG) BY MOUTH DAILY WITH OR IMMEDIATELY FOLLOWING A MEAL 90 tablet 1   No current facility-administered medications on file prior to visit.    Social History   Tobacco Use   Smoking status: Never   Smokeless tobacco: Never  Vaping Use   Vaping Use: Never used  Substance Use Topics   Alcohol use: Never   Drug use: Never    Review of Systems  Constitutional:  Negative for chills and fever.  Respiratory:  Negative for cough.   Cardiovascular:  Negative for chest pain and palpitations.  Gastrointestinal:  Negative for  nausea and vomiting.  Genitourinary:  Positive for frequency. Negative for difficulty urinating, dysuria and hematuria.     Objective:    BP 134/84 (BP Location: Left Arm, Patient Position: Sitting, Cuff Size: Large)    Pulse 64    Temp 98.5 F (36.9 C) (Oral)    Resp 12    Ht 5\' 3"  (1.6 m)    Wt 164 lb 3.2 oz (74.5 kg)    SpO2 98%    BMI 29.09 kg/m  BP Readings from Last 3 Encounters:  09/18/21 134/84  07/27/21 124/82  04/14/21 124/74   Wt Readings from Last 3 Encounters:  09/18/21 164 lb 3.2 oz (74.5 kg)  07/27/21 159 lb (72.1 kg)  04/14/21 159 lb (72.1 kg)    Physical Exam Vitals reviewed.  Constitutional:      Appearance: She is well-developed.  Eyes:     Conjunctiva/sclera: Conjunctivae normal.  Cardiovascular:     Rate and Rhythm: Normal rate and regular  rhythm.     Pulses: Normal pulses.     Heart sounds: Normal heart sounds.  Pulmonary:     Effort: Pulmonary effort is normal.     Breath sounds: Normal breath sounds. No wheezing, rhonchi or rales.  Musculoskeletal:     Right hand: Normal. No swelling, deformity, lacerations or bony tenderness. Normal range of motion. Normal strength. Normal sensation.     Left hand: Normal. No swelling, deformity or bony tenderness. Normal range of motion. Normal strength. Normal sensation.     Right lower leg: No edema.     Left lower leg: No edema.  Skin:    General: Skin is warm and dry.  Neurological:     Mental Status: She is alert.  Psychiatric:        Speech: Speech normal.        Behavior: Behavior normal.        Thought Content: Thought content normal.       Assessment & Plan:   Problem List Items Addressed This Visit       Cardiovascular and Mediastinum   HTN (hypertension)    Chronic, stable.  Pleased with blood pressure at home.  Continue Norvasc 5 mg, losartan 25 mg, Toprol 50 mg      Relevant Orders   TSH   CBC with Differential/Platelet   Comprehensive metabolic panel   Lipid panel     Other   Hand pain    Benign hand exam.  No deformities, swelling.  Advised over-the-counter Voltaren gel and massage, stretching prior to cooking and peeling vegetables.  She will let me know how she is doing      Prediabetes   Relevant Orders   Hemoglobin A1c   Urinary frequency     Urinary frequency, urgency, nocturia.  We agreed to retry oxybutynin at higher dose as she previously been on 10 mg.  She has a full bottle of oxybutynin 10 mg, jointly agreed to increase to 20 mg. New rx sent as well.  Pending urine studies to exclude infection and have advised patient not to restart oxybutynin until urine studies have resulted.  Consider evaluation for OSA at follow up.  If no resolution of symptoms, we agreed to arrange consult with urology, Dr. Versie Starks again      Relevant Medications    oxybutynin (DITROPAN-XL) 10 MG 24 hr tablet   Other Relevant Orders   Urinalysis, Routine w reflex microscopic   Urine Culture   Other Visit Diagnoses  Screening for osteoporosis    -  Primary   Relevant Orders   VITAMIN D 25 Hydroxy (Vit-D Deficiency, Fractures)   DG Bone Density   Encounter for screening mammogram for malignant neoplasm of breast       Relevant Orders   MM 3D SCREEN BREAST BILATERAL   Screening for eye condition       Relevant Orders   Ambulatory referral to Ophthalmology   History of prediabetes       Relevant Orders   Hemoglobin A1c   Asymptomatic postmenopausal state       Relevant Orders   VITAMIN D 25 Hydroxy (Vit-D Deficiency, Fractures)   DG Bone Density   Osteopenia, unspecified location       Relevant Orders   VITAMIN D 25 Hydroxy (Vit-D Deficiency, Fractures)   Other specified disorders of bone density and structure, right lower leg       Relevant Orders   VITAMIN D 25 Hydroxy (Vit-D Deficiency, Fractures)        I am having Donna Ferguson start on oxybutynin. I am also having her maintain her vitamin C, Calcium Carbonate-Vit D-Min (CALTRATE 600+D PLUS PO), losartan, metoprolol succinate, and amLODipine.   Meds ordered this encounter  Medications   oxybutynin (DITROPAN-XL) 10 MG 24 hr tablet    Sig: Take 2 tablets (20 mg total) by mouth at bedtime.    Dispense:  120 tablet    Refill:  1    Order Specific Question:   Supervising Provider    Answer:   Crecencio Mc [[id number]]    Return precautions given.   Risks, benefits, and alternatives of the medications and treatment plan prescribed today were discussed, and patient expressed understanding.   Education regarding symptom management and diagnosis given to patient on AVS.  Continue to follow with Donna Hawthorne, FNP for routine health maintenance.   Donna Ferguson and I agreed with plan.   Donna Paris, FNP

## 2021-09-18 NOTE — Assessment & Plan Note (Signed)
Chronic, stable.  Pleased with blood pressure at home.  Continue Norvasc 5 mg, losartan 25 mg, Toprol 50 mg

## 2021-09-19 ENCOUNTER — Other Ambulatory Visit: Payer: Self-pay

## 2021-09-19 ENCOUNTER — Telehealth: Payer: Self-pay | Admitting: Family

## 2021-09-19 DIAGNOSIS — I10 Essential (primary) hypertension: Secondary | ICD-10-CM

## 2021-09-19 DIAGNOSIS — R350 Frequency of micturition: Secondary | ICD-10-CM

## 2021-09-19 DIAGNOSIS — R944 Abnormal results of kidney function studies: Secondary | ICD-10-CM

## 2021-09-19 NOTE — Telephone Encounter (Signed)
See result note. Pt called and scheduled.

## 2021-09-19 NOTE — Telephone Encounter (Signed)
Pt called in stating that NP Arnett advise Pt to call office to schedule appt for Urine sample. Pt stated that NP Arnett advise her to redo urine sample. No orders are placed.  Pt requesting callback

## 2021-09-21 ENCOUNTER — Other Ambulatory Visit: Payer: Self-pay | Admitting: Family

## 2021-09-21 DIAGNOSIS — N390 Urinary tract infection, site not specified: Secondary | ICD-10-CM

## 2021-09-21 LAB — URINE CULTURE
MICRO NUMBER:: 12937126
SPECIMEN QUALITY:: ADEQUATE

## 2021-09-21 MED ORDER — AMOXICILLIN-POT CLAVULANATE 875-125 MG PO TABS
1.0000 | ORAL_TABLET | Freq: Two times a day (BID) | ORAL | 0 refills | Status: AC
Start: 2021-09-21 — End: 2021-09-28

## 2021-09-21 NOTE — Telephone Encounter (Signed)
Patient called in still waiting the results from urine, she can not access her mychart at this time. Please call patient @ [phone]

## 2021-09-21 NOTE — Telephone Encounter (Signed)
Pt called in stating that she know she has an UTI. Pt stated that no antibiotic was sent over for her UTI. Pt requesting for antibiotic to be sent over to pharmacy. Pt requesting callback.

## 2021-09-25 NOTE — Telephone Encounter (Signed)
Pt was called see result note.

## 2021-09-26 ENCOUNTER — Telehealth: Payer: Self-pay | Admitting: Family

## 2021-09-26 NOTE — Telephone Encounter (Signed)
I called [facility] & was able to make patient appointment 2/27 at 11:30 with Dr. Candiss Norse. Pt called and is aware.

## 2021-09-26 NOTE — Telephone Encounter (Signed)
I tried to call [facility] but did not receive an answer. Will call back.

## 2021-09-26 NOTE — Telephone Encounter (Signed)
Pt called stating that no one has called her regarding her referral appointment being scheduled

## 2021-10-02 ENCOUNTER — Other Ambulatory Visit: Payer: Medicare Other

## 2021-10-16 ENCOUNTER — Other Ambulatory Visit: Payer: Self-pay

## 2021-10-16 ENCOUNTER — Other Ambulatory Visit (INDEPENDENT_AMBULATORY_CARE_PROVIDER_SITE_OTHER): Payer: Medicare Other

## 2021-10-16 DIAGNOSIS — R350 Frequency of micturition: Secondary | ICD-10-CM

## 2021-10-16 LAB — URINALYSIS, ROUTINE W REFLEX MICROSCOPIC
Bilirubin Urine: NEGATIVE
Hgb urine dipstick: NEGATIVE
Ketones, ur: NEGATIVE
Leukocytes,Ua: NEGATIVE
Nitrite: NEGATIVE
RBC / HPF: NONE SEEN (ref 0–?)
Specific Gravity, Urine: 1.02 (ref 1.000–1.030)
Total Protein, Urine: 30 — AB
Urine Glucose: NEGATIVE
Urobilinogen, UA: 0.2 (ref 0.0–1.0)
pH: 6 (ref 5.0–8.0)

## 2021-10-23 ENCOUNTER — Other Ambulatory Visit: Payer: Self-pay | Admitting: Family

## 2021-10-23 DIAGNOSIS — R8290 Unspecified abnormal findings in urine: Secondary | ICD-10-CM

## 2021-10-25 ENCOUNTER — Other Ambulatory Visit: Payer: Medicare Other

## 2021-10-25 ENCOUNTER — Other Ambulatory Visit: Payer: Self-pay

## 2021-10-25 DIAGNOSIS — R8290 Unspecified abnormal findings in urine: Secondary | ICD-10-CM

## 2021-10-25 LAB — MICROALBUMIN / CREATININE URINE RATIO
Creatinine,U: 20 mg/dL
Microalb Creat Ratio: 3.5 mg/g (ref 0.0–30.0)
Microalb, Ur: <0.7 mg/dL (ref 0.0–1.9)

## 2021-11-17 ENCOUNTER — Other Ambulatory Visit: Payer: Self-pay | Admitting: Family

## 2021-11-17 ENCOUNTER — Encounter: Payer: Self-pay | Admitting: Family

## 2021-11-17 ENCOUNTER — Ambulatory Visit
Admission: RE | Admit: 2021-11-17 | Discharge: 2021-11-17 | Disposition: A | Discharge status: Home / Self Care | Payer: Medicare Other | Source: Ambulatory Visit | Attending: Family | Admitting: Family

## 2021-11-17 ENCOUNTER — Ambulatory Visit (INDEPENDENT_AMBULATORY_CARE_PROVIDER_SITE_OTHER): Payer: Medicare Other

## 2021-11-17 ENCOUNTER — Ambulatory Visit (INDEPENDENT_AMBULATORY_CARE_PROVIDER_SITE_OTHER): Payer: Medicare Other | Admitting: Family

## 2021-11-17 VITALS — BP 132/82 | HR 82 | Temp 98.1°F | Ht 63.0 in | Wt 166.0 lb

## 2021-11-17 DIAGNOSIS — M25562 Pain in left knee: Secondary | ICD-10-CM

## 2021-11-17 DIAGNOSIS — M25561 Pain in right knee: Secondary | ICD-10-CM

## 2021-11-17 DIAGNOSIS — I10 Essential (primary) hypertension: Secondary | ICD-10-CM

## 2021-11-17 DIAGNOSIS — R350 Frequency of micturition: Secondary | ICD-10-CM | POA: Diagnosis not present

## 2021-11-17 DIAGNOSIS — R1030 Lower abdominal pain, unspecified: Secondary | ICD-10-CM | POA: Insufficient documentation

## 2021-11-17 DIAGNOSIS — G8929 Other chronic pain: Secondary | ICD-10-CM | POA: Diagnosis not present

## 2021-11-17 DIAGNOSIS — R1032 Left lower quadrant pain: Secondary | ICD-10-CM | POA: Insufficient documentation

## 2021-11-17 NOTE — Assessment & Plan Note (Signed)
Chronic, stable.  Continue Norvasc 5 mg, losartan 25 mg, Toprol 50 mg ?

## 2021-11-17 NOTE — Patient Instructions (Addendum)
As discussed, let's start by scheduling Tylenol Arthritis which is a '650mg'$  tablet for knee pain.  Please continue icing 20 minutes 3 times per day.  You may wear Ace wrap.  Please make following with [facility] as well ? ?You may take 1-2 tablets every 8 hours ( scheduled) with maximum of 6 tablets per day.  ? ?For example , you could take two tablets in the morning ( 8am) and then two tablets again at 4pm.  ? ?Maximum daily dose of acetaminophen 4 g per day from all sources.  If you are taking another medication which includes acetaminophen (Tylenol) which may be in cough and cold preparations or pain medication such as Percocet, you will need to factor that into your total daily dose to be safe.  Please let me know if any questions ? ?We have ordered CT abdomen and pelvis.  I will call you with any stat results.  Otherwise I will result it to your MyChart ? ? ? ? ?

## 2021-11-17 NOTE — Assessment & Plan Note (Signed)
Afebrile. Non toxic in appearance today. Unable to review previous colonoscopy is done in [place]. LLQ Pain elicited on exam.  Pending CT abdomen and pelvis without contrast to evaluate for diverticulitis.  Ordered without IV contrast due to CKD.  ?

## 2021-11-17 NOTE — Assessment & Plan Note (Signed)
Resolved at this time. If symptoms were to recur, consider trospium.  ?

## 2021-11-17 NOTE — Addendum Note (Signed)
Addended by: Leeanne Rio on: 11/17/2021 10:34 AM ? ? Modules accepted: Orders ? ?

## 2021-11-17 NOTE — Progress Notes (Signed)
? ?Subjective:  ? ? Patient ID: Donna Ferguson, female    DOB: Jan 16, 1941, 81 y.o.   MRN: [id number] ? ?CC: Donna Ferguson is a 81 y.o. female who presents today for follow up.  ? ?HPI: Complains of left knee pain x 2 days, pain started after working in yard pulling weeds , moving stones. She wasn't on her knees at this  time.  ?Cold compress has helped.  ?Describes as 'ache' ?She hasnt tried any medication ? ?H/o left total knee replacement; she had planned to have right TKR.  ? ?No fall or particular injury.  ? ?She complains of LLQ abdominal pain x 2 days, unchanged.  ?She has ribs 2 days ago and she thinks it has irritated her 'bowels'.  ? ?Endorses chronic constipation and she takes miralax 3 times per week.  ?Last BM yesterday. Endorses straining with BM.  ?No flank pain, nausea, vomiting, fever, blood in stool, change in diameter, hemorrhoids.  ? ?No h/o renal stone, diverticulitis.  ? ?Last colonoscopy 5 years ago in [place]. Per patient, she had polyps and was told 'was good and she longer needed colonoscopy' ? ? ? ? ?H/o CKD ? ?She has history of abdominal hysterectomy ? ?Hypertension-compliant with Norvasc 5 mg, losartan 25 mg, Toprol 50 mg. No cp.  ? ?Urinary frequency- She continues to have occasional urinary urgency. she is not taking oxybutynin 20 mg nightly as had previous caused abdominal pain. She has been drinking water earlier and symptom has improved. No dysuria ? ?HISTORY:  ?Past Medical History:  ?Diagnosis Date  ? Arthritis   ? Chronic kidney disease   ? cyst near kidney  ? Heart murmur   ? past  ? History of colonoscopy   ? 2019 in [place]; patient states no longer screening for colon cancer.  ? Hypertension   ? IBS (irritable bowel syndrome)   ? Pre-diabetes   ? Renal cyst   ? ?Past Surgical History:  ?Procedure Laterality Date  ? ABDOMINAL HYSTERECTOMY    ? unsure if has ovaries.   ? BREAST EXCISIONAL BIOPSY Bilateral late 80s, early 90s   ? benign  ? cyst N/A   ? cyst removal both breast  ?  JOINT REPLACEMENT Left   ? knee  ? REPLACEMENT TOTAL KNEE  2017  ? ?Family History  ?Problem Relation Age of Onset  ? Hypertension Mother   ? Diabetes Father   ? Hypertension Father   ? Heart disease Father   ? Hypertension Sister   ? Throat cancer Sister   ? Hypertension Daughter   ? Breast cancer Neg Hx   ? ? ?Allergies: Patient has no known allergies. ?Current Outpatient Medications on File Prior to Visit  ?Medication Sig Dispense Refill  ? amLODipine (NORVASC) 5 MG tablet TAKE 1 TABLET(5 MG) BY MOUTH EVERY EVENING 90 tablet 1  ? Ascorbic Acid (VITAMIN C) 1000 MG tablet Take 1,000 mg by mouth daily.    ? Calcium Carbonate-Vit D-Min (CALTRATE 600+D PLUS PO) Take 1 tablet by mouth daily.    ? losartan (COZAAR) 25 MG tablet Take 1 tablet (25 mg total) by mouth daily. 90 tablet 1  ? metoprolol succinate (TOPROL-XL) 50 MG 24 hr tablet TAKE 1 TABLET(50 MG) BY MOUTH DAILY WITH OR IMMEDIATELY FOLLOWING A MEAL 90 tablet 1  ? ?No current facility-administered medications on file prior to visit.  ? ? ?Social History  ? ?Tobacco Use  ? Smoking status: Never  ? Smokeless tobacco: Never  ?Vaping Use  ?  Vaping Use: Never used  ?Substance Use Topics  ? Alcohol use: Never  ? Drug use: Never  ? ? ?Review of Systems  ?Constitutional:  Negative for chills and fever.  ?Respiratory:  Negative for cough.   ?Cardiovascular:  Negative for chest pain and palpitations.  ?Gastrointestinal:  Positive for abdominal pain and constipation (chronic). Negative for abdominal distention, nausea and vomiting.  ?Genitourinary:  Negative for dysuria and frequency.  ?   ?Objective:  ?  ?BP 132/82 (BP Location: Left Arm, Patient Position: Sitting, Cuff Size: Normal)   Pulse 82   Temp 98.1 ?F (36.7 ?C) (Oral)   Ht '5\' 3"'$  (1.6 m)   Wt 166 lb (75.3 kg)   SpO2 99%   BMI 29.41 kg/m?  ?BP Readings from Last 3 Encounters:  ?11/17/21 132/82  ?09/18/21 134/84  ?07/27/21 124/82  ? ?Wt Readings from Last 3 Encounters:  ?11/17/21 166 lb (75.3 kg)  ?09/18/21  164 lb 3.2 oz (74.5 kg)  ?07/27/21 159 lb (72.1 kg)  ? ? ?Physical Exam ?Vitals reviewed.  ?Constitutional:   ?   Appearance: Normal appearance. She is well-developed.  ?Eyes:  ?   Conjunctiva/sclera: Conjunctivae normal.  ?Cardiovascular:  ?   Rate and Rhythm: Normal rate and regular rhythm.  ?   Pulses: Normal pulses.  ?   Heart sounds: Normal heart sounds.  ?Pulmonary:  ?   Effort: Pulmonary effort is normal.  ?   Breath sounds: Normal breath sounds. No wheezing, rhonchi or rales.  ?Abdominal:  ?   General: Bowel sounds are normal. There is no distension.  ?   Palpations: Abdomen is soft. Abdomen is not rigid. There is no fluid wave or mass.  ?   Tenderness: There is abdominal tenderness in the left lower quadrant. There is no guarding or rebound.  ?Musculoskeletal:  ?   Right knee: No swelling. Decreased range of motion. Tenderness present over the medial joint line and lateral joint line.  ?   Left knee: No swelling. Decreased range of motion. Tenderness present over the medial joint line and lateral joint line.  ?   Comments: Bilateral knee joints are symmetrically enlarged. Concern for left knee supra articular effusion. No increase in warmth or erythema bilaterally. Crepitus felt with flexion of bilateral knees. ? ?Left knee:  ?Able to extend to -5 to 10 degrees and reduction in flexion. No catching with McMurray maneuver. No calf tenderness of lower leg edema bilaterally. Stiffness as ascended the exam table.  ? ?  ?Skin: ?   General: Skin is warm and dry.  ?Neurological:  ?   Mental Status: She is alert.  ?Psychiatric:     ?   Speech: Speech normal.     ?   Behavior: Behavior normal.     ?   Thought Content: Thought content normal.  ? ? ?   ?Assessment & Plan:  ? ?Problem List Items Addressed This Visit   ? ?  ? Cardiovascular and Mediastinum  ? HTN (hypertension)  ?  Chronic, stable.  Continue Norvasc 5 mg, losartan 25 mg, Toprol 50 mg ?  ?  ?  ? Other  ? Abdominal pain  ? Relevant Orders  ? CT Abdomen  Pelvis Wo Contrast  ? Bilateral knee pain  ?  Acute on chronic.  Suspect severe degenerative changes. Advised cool compresses, Ace wrap, Tylenol arthritis in the setting of chronic kidney disease.  Pending bilateral knee x-rays today.  She will continue to follow with Donna Folks  Ferguson.  ?  ?  ? Relevant Orders  ? DG Knee Complete 4 Views Left  ? DG Knee Complete 4 Views Right  ? LLQ pain - Primary  ?  Afebrile. Non toxic in appearance today. Unable to review previous colonoscopy is done in [place]. LLQ Pain elicited on exam.  Pending CT abdomen and pelvis without contrast to evaluate for diverticulitis.  Ordered without IV contrast due to CKD.  ?  ?  ? Relevant Orders  ? CT Abdomen Pelvis Wo Contrast  ? CBC with Differential/Platelet  ? Basic metabolic panel  ? Urinary frequency  ?  Resolved at this time. If symptoms were to recur, consider trospium.  ?  ?  ? ? ? ?I have discontinued Donna Ferguson's oxybutynin. I am also having her maintain her vitamin C, Calcium Carbonate-Vit D-Min (CALTRATE 600+D PLUS PO), losartan, metoprolol succinate, and amLODipine. ? ? ?No orders of the defined types were placed in this encounter. ? ? ?Return precautions given.  ? ?Risks, benefits, and alternatives of the medications and treatment plan prescribed today were discussed, and patient expressed understanding.  ? ?Education regarding symptom management and diagnosis given to patient on AVS. ? ?Continue to follow with Donna Hawthorne, FNP for routine health maintenance.  ? ?Donna Ferguson and I agreed with plan.  ? ?[name], FNP ? ? ?

## 2021-11-17 NOTE — Assessment & Plan Note (Signed)
Acute on chronic.  Suspect severe degenerative changes. Advised cool compresses, Ace wrap, Tylenol arthritis in the setting of chronic kidney disease.  Pending bilateral knee x-rays today.  She will continue to follow with Donna Ferguson.  ?

## 2021-11-18 ENCOUNTER — Encounter: Payer: Self-pay | Admitting: Family

## 2021-11-20 ENCOUNTER — Other Ambulatory Visit: Payer: Self-pay | Admitting: Family

## 2021-11-20 DIAGNOSIS — R350 Frequency of micturition: Secondary | ICD-10-CM

## 2021-11-20 MED ORDER — TROSPIUM CHLORIDE 20 MG PO TABS: 20.0000 mg | ORAL_TABLET | Freq: Two times a day (BID) | ORAL | 2 refills | Status: DC

## 2021-11-21 ENCOUNTER — Encounter: Payer: Self-pay | Admitting: Family

## 2021-11-21 DIAGNOSIS — N281 Cyst of kidney, acquired: Secondary | ICD-10-CM

## 2021-11-21 DIAGNOSIS — R591 Generalized enlarged lymph nodes: Secondary | ICD-10-CM

## 2021-11-22 NOTE — Telephone Encounter (Signed)
No follow up needed

## 2021-11-22 NOTE — Telephone Encounter (Signed)
-----   Message from Billey Co, MD sent at 11/21/2021 12:58 PM EDT ----- ?No follow-up or surveillance needed ? ?Nickolas Madrid, MD ?11/21/2021 ? ? ?----- Message ----- ?From: Burnard Hawthorne, FNP ?Sent: 11/21/2021  12:46 PM EDT ?To: Billey Co, MD ? ?Dr Diamantina Providence, ? ?I would appreciate your advise regarding mutual patient. I obtained CT a/p due to abdominal discomfort, constipation, history of diverticulosis. It shows Right cyst up to 5.1 cm .  ? ?Based on this size ( > 55m) , would this warrant follow up /surveillance?  ? ?There is smaller 8 mm left lower pole renal lesion which I suspect doesn't require follow up.  ? ?Thanks, always. ? ?MJoycelyn Schmid? ? ? ? ?

## 2021-11-29 DIAGNOSIS — N1831 Chronic kidney disease, stage 3a: Secondary | ICD-10-CM | POA: Insufficient documentation

## 2021-11-29 DIAGNOSIS — K589 Irritable 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 diarrhea: Secondary | ICD-10-CM | POA: Insufficient documentation

## 2021-11-29 DIAGNOSIS — M158 Other polyosteoarthritis: Secondary | ICD-10-CM | POA: Insufficient documentation

## 2021-12-05 ENCOUNTER — Encounter: Payer: Self-pay | Admitting: Oncology

## 2021-12-05 ENCOUNTER — Inpatient Hospital Stay: Payer: Medicare Other

## 2021-12-05 ENCOUNTER — Inpatient Hospital Stay: Payer: Medicare Other | Attending: Oncology | Admitting: Oncology

## 2021-12-05 VITALS — BP 135/80 | HR 77 | Temp 98.2°F | Resp 16 | Ht 63.0 in | Wt 165.9 lb

## 2021-12-05 DIAGNOSIS — I129 Hypertensive chronic kidney disease with stage 1 through stage 4 chronic kidney disease, or unspecified chronic kidney disease: Secondary | ICD-10-CM | POA: Insufficient documentation

## 2021-12-05 DIAGNOSIS — Z808 Family history of malignant neoplasm of other organs or systems: Secondary | ICD-10-CM | POA: Insufficient documentation

## 2021-12-05 DIAGNOSIS — R591 Generalized enlarged lymph nodes: Secondary | ICD-10-CM | POA: Diagnosis not present

## 2021-12-05 DIAGNOSIS — D631 Anemia in chronic kidney disease: Secondary | ICD-10-CM | POA: Diagnosis not present

## 2021-12-05 DIAGNOSIS — R590 Localized enlarged lymph nodes: Secondary | ICD-10-CM | POA: Diagnosis present

## 2021-12-05 DIAGNOSIS — N1831 Chronic kidney disease, stage 3a: Secondary | ICD-10-CM | POA: Insufficient documentation

## 2021-12-05 LAB — CBC WITH DIFFERENTIAL/PLATELET
Abs Immature Granulocytes: 0.13 10*3/uL — ABNORMAL HIGH (ref 0.00–0.07)
Basophils Absolute: 0.1 10*3/uL (ref 0.0–0.1)
Basophils Relative: 1 %
Eosinophils Absolute: 0.1 10*3/uL (ref 0.0–0.5)
Eosinophils Relative: 1 %
HCT: 42.1 % (ref 36.0–46.0)
Hemoglobin: 13.3 g/dL (ref 12.0–15.0)
Immature Granulocytes: 1 %
Lymphocytes Relative: 27 %
Lymphs Abs: 2.7 10*3/uL (ref 0.7–4.0)
MCH: 28.5 pg (ref 26.0–34.0)
MCHC: 31.6 g/dL (ref 30.0–36.0)
MCV: 90.3 fL (ref 80.0–100.0)
Monocytes Absolute: 0.9 10*3/uL (ref 0.1–1.0)
Monocytes Relative: 9 %
Neutro Abs: 6.3 10*3/uL (ref 1.7–7.7)
Neutrophils Relative %: 61 %
Platelets: 336 10*3/uL (ref 150–400)
RBC: 4.66 MIL/uL (ref 3.87–5.11)
RDW: 13.5 % (ref 11.5–15.5)
WBC: 10.2 10*3/uL (ref 4.0–10.5)
nRBC: 0 % (ref 0.0–0.2)

## 2021-12-05 LAB — LACTATE DEHYDROGENASE: LDH: 151 U/L (ref 98–192)

## 2021-12-05 LAB — COMPREHENSIVE METABOLIC PANEL
ALT: 19 U/L (ref 0–44)
AST: 22 U/L (ref 15–41)
Albumin: 3.7 g/dL (ref 3.5–5.0)
Alkaline Phosphatase: 85 U/L (ref 38–126)
Anion gap: 8 (ref 5–15)
BUN: 17 mg/dL (ref 8–23)
CO2: 25 mmol/L (ref 22–32)
Calcium: 9.2 mg/dL (ref 8.9–10.3)
Chloride: 102 mmol/L (ref 98–111)
Creatinine, Ser: 1.06 mg/dL — ABNORMAL HIGH (ref 0.44–1.00)
GFR, Estimated: 53 mL/min — ABNORMAL LOW (ref >60)
Glucose, Bld: 103 mg/dL — ABNORMAL HIGH (ref 70–99)
Potassium: 3.9 mmol/L (ref 3.5–5.1)
Sodium: 135 mmol/L (ref 135–145)
Total Bilirubin: 0.5 mg/dL (ref 0.3–1.2)
Total Protein: 7.4 g/dL (ref 6.5–8.1)

## 2021-12-05 LAB — TECHNOLOGIST SMEAR REVIEW
Plt Morphology: NORMAL
RBC MORPHOLOGY: NORMAL
WBC MORPHOLOGY: NORMAL

## 2021-12-05 NOTE — Progress Notes (Signed)
?Hematology/Oncology Consult note ?[phone] [phone] ?  ? ?   ? ? ?Patient Care Team: ?Burnard Hawthorne, FNP as PCP - General (Family Medicine) ?Kate Sable, MD as PCP - Cardiology (Cardiology) ? ?REFERRING PROVIDER: ?Burnard Hawthorne, FNP  ?CHIEF COMPLAINTS/REASON FOR VISIT:  ?Evaluation of lymphadenopathy ? ?HISTORY OF PRESENTING ILLNESS:  ? ?Donna Ferguson is a  81 y.o.  female with PMH listed below was seen in consultation at the request of  Burnard Hawthorne, FNP  for evaluation of lymphadenopathy. ? ? ?Patient was recently seen by primary care provider.  She has experienced left lower quadrant pain.  Patient reports symptoms being triggered after eating fatty food.  She has a history of IBS. ?11/17/2021, CT abdomen pelvis without contrast was obtained for further evaluation.  CT showed no acute abnormality of the abdomen or pelvis.  Scattered sigmoid colonic diverticulosis without findings of acute diverticulitis.  Nonspecific prominent/mildly enlarged lymph nodes along the mesenteric root measuring up to 13 mm in short axis previously measuring up to 9 mm.  Constipation. ? ?Patient was referred to establish care with oncology for further evaluation. ?Patient reports feeling well.  Her lower abdomen pain has completely resolved.  No fever, chills, nausea vomiting.  No significant change of her bowel habits. ? ? ? ?Review of Systems  ?Constitutional:  Negative for appetite change, chills, fatigue and fever.  ?HENT:   Negative for hearing loss and voice change.   ?Eyes:  Negative for eye problems.  ?Respiratory:  Negative for chest tightness and cough.   ?Cardiovascular:  Negative for chest pain.  ?Gastrointestinal:  Negative for abdominal distention, abdominal pain and blood in stool.  ?Endocrine: Negative for hot flashes.  ?Genitourinary:  Negative for difficulty urinating and frequency.   ?Musculoskeletal:  Negative for arthralgias.  ?Skin:  Negative for itching and rash.   ?Neurological:  Negative for extremity weakness.  ?Hematological:  Negative for adenopathy.  ?Psychiatric/Behavioral:  Negative for confusion.   ? ?MEDICAL HISTORY:  ?Past Medical History:  ?Diagnosis Date  ? Arthritis   ? Chronic kidney disease   ? cyst near kidney  ? Heart murmur   ? past  ? History of colonoscopy   ? 2019 in [place]; patient states no longer screening for colon cancer.  ? Hypertension   ? IBS (irritable bowel syndrome)   ? Pre-diabetes   ? Renal cyst   ? ? ?SURGICAL HISTORY: ?Past Surgical History:  ?Procedure Laterality Date  ? ABDOMINAL HYSTERECTOMY    ? unsure if has ovaries.   ? BREAST EXCISIONAL BIOPSY Bilateral late 80s, early 90s   ? benign  ? cyst N/A   ? cyst removal both breast  ? JOINT REPLACEMENT Left   ? knee  ? REPLACEMENT TOTAL KNEE  2017  ? ? ?SOCIAL HISTORY: ?Social History  ? ?Socioeconomic History  ? Marital status: Married  ?  Spouse name: Not on file  ? Number of children: 2  ? Years of education: Not on file  ? Highest education level: Not on file  ?Occupational History  ? Not on file  ?Tobacco Use  ? Smoking status: Never  ? Smokeless tobacco: Never  ?Vaping Use  ? Vaping Use: Never used  ?Substance and Sexual Activity  ? Alcohol use: Never  ? Drug use: Never  ? Sexual activity: Yes  ?  Birth control/protection: Surgical, Post-menopausal  ?Other Topics Concern  ? Not on file  ?Social History Narrative  ? [place] in  [place]- Retired  ? 2 children son & daughter  ? Married  ? From [place]  ? Retired 2011.   ? Enjoys painting  ? ?Social Determinants of Health  ? ?Financial Resource Strain: Low Risk   ? Difficulty of Paying Living Expenses: Not hard at all  ?Food Insecurity: No Food Insecurity  ? Worried About Charity fundraiser in the Last Year: Never true  ? Ran Out of Food in the Last Year: Never true  ?Transportation Needs: No Transportation Needs  ? Lack of Transportation (Medical): No  ? Lack of Transportation (Non-Medical): No  ?Physical  Activity: Not on file  ?Stress: No Stress Concern Present  ? Feeling of Stress : Not at all  ?Social Connections: Unknown  ? Frequency of Communication with Friends and Family: Not on file  ? Frequency of Social Gatherings with Friends and Family: Not on file  ? Attends Religious Services: Not on file  ? Active Member of Clubs or Organizations: Not on file  ? Attends Archivist Meetings: Not on file  ? Marital Status: Married  ?Intimate Partner Violence: Not At Risk  ? Fear of Current or Ex-Partner: No  ? Emotionally Abused: No  ? Physically Abused: No  ? Sexually Abused: No  ? ? ?FAMILY HISTORY: ?Family History  ?Problem Relation Age of Onset  ? Hypertension Mother   ? Diabetes Father   ? Hypertension Father   ? Heart disease Father   ? Hypertension Sister   ? Throat cancer Sister   ? Hypertension Daughter   ? Breast cancer Neg Hx   ? ? ?ALLERGIES:  has No Known Allergies. ? ?MEDICATIONS:  ?Current Outpatient Medications  ?Medication Sig Dispense Refill  ? amLODipine (NORVASC) 5 MG tablet TAKE 1 TABLET(5 MG) BY MOUTH EVERY EVENING 90 tablet 1  ? Ascorbic Acid (VITAMIN C) 1000 MG tablet Take 1,000 mg by mouth daily.    ? Calcium Carbonate-Vit D-Min (CALTRATE 600+D PLUS PO) Take 1 tablet by mouth daily.    ? losartan (COZAAR) 25 MG tablet Take 1 tablet (25 mg total) by mouth daily. 90 tablet 1  ? metoprolol succinate (TOPROL-XL) 50 MG 24 hr tablet TAKE 1 TABLET(50 MG) BY MOUTH DAILY WITH OR IMMEDIATELY FOLLOWING A MEAL 90 tablet 1  ? trospium (SANCTURA) 20 MG tablet Take 1 tablet (20 mg total) by mouth 2 (two) times daily. 60 tablet 2  ? ?No current facility-administered medications for this visit.  ? ? ? ?PHYSICAL EXAMINATION: ?ECOG PERFORMANCE STATUS: 0 - Asymptomatic ?Vitals:  ? 12/05/21 1102 12/05/21 1106  ?BP:  135/80  ?Pulse: 77   ?Resp: 16   ?Temp: 98.2 ?F (36.8 ?C)   ?SpO2: 98%   ? ?Filed Weights  ? 12/05/21 1102  ?Weight: 165 lb 14.4 oz (75.3 kg)  ? ? ?Physical Exam ?Constitutional:   ?   General:  She is not in acute distress. ?HENT:  ?   Head: Normocephalic and atraumatic.  ?Eyes:  ?   General: No scleral icterus. ?Cardiovascular:  ?   Rate and Rhythm: Normal rate and regular rhythm.  ?   Heart sounds: Normal heart sounds.  ?Pulmonary:  ?   Effort: Pulmonary effort is normal. No respiratory distress.  ?   Breath sounds: No wheezing.  ?Abdominal:  ?   General: Bowel sounds are normal. There is no distension.  ?   Palpations: Abdomen is soft.  ?Musculoskeletal:     ?   General: No deformity. Normal  range of motion.  ?   Cervical back: Normal range of motion and neck supple.  ?Skin: ?   General: Skin is warm and dry.  ?   Findings: No erythema or rash.  ?Neurological:  ?   Mental Status: She is alert and oriented to person, place, and time. Mental status is at baseline.  ?   Cranial Nerves: No cranial nerve deficit.  ?   Coordination: Coordination normal.  ?Psychiatric:     ?   Mood and Affect: Mood normal.  ? ? ?LABORATORY DATA:  ?I have reviewed the data as listed ?Lab Results  ?Component Value Date  ? WBC 10.2 12/05/2021  ? HGB 13.3 12/05/2021  ? HCT 42.1 12/05/2021  ? MCV 90.3 12/05/2021  ? PLT 336 12/05/2021  ? ?Recent Labs  ?  09/18/21 ?0929 12/05/21 ?1147  ?NA 140 135  ?K 3.9 3.9  ?CL 103 102  ?CO2 26 25  ?GLUCOSE 87 103*  ?BUN 20 17  ?CREATININE 1.16 1.06*  ?CALCIUM 9.7 9.2  ?GFRNONAA  --  30*  ?PROT 6.9 7.4  ?ALBUMIN 4.0 3.7  ?AST 17 22  ?ALT 12 19  ?ALKPHOS 85 85  ?BILITOT 0.5 0.5  ? ?Iron/TIBC/Ferritin/ %Sat ?No results found for: IRON, TIBC, FERRITIN, IRONPCTSAT  ? ? ?RADIOGRAPHIC STUDIES: ?I have personally reviewed the radiological images as listed and agreed with the findings in the report. ?CT Abdomen Pelvis Wo Contrast ? ?Result Date: 11/17/2021 ?CLINICAL DATA:  Left lower quadrant abdominal pain concern for diverticulitis. EXAM: CT ABDOMEN AND PELVIS WITHOUT CONTRAST TECHNIQUE: Multidetector CT imaging of the abdomen and pelvis was performed following the standard protocol without IV contrast.  RADIATION DOSE REDUCTION: This exam was performed according to the departmental dose-optimization program which includes automated exposure control, adjustment of the mA and/or kV according to patient si

## 2021-12-05 NOTE — Progress Notes (Signed)
Patient here for initial diagnosis and treatment plan. She is unclear why she is here today.  ?

## 2021-12-06 ENCOUNTER — Other Ambulatory Visit: Payer: Self-pay | Admitting: Family

## 2021-12-06 DIAGNOSIS — I10 Essential (primary) hypertension: Secondary | ICD-10-CM

## 2021-12-06 LAB — KAPPA/LAMBDA LIGHT CHAINS
Kappa free light chain: 21.2 mg/L — ABNORMAL HIGH (ref 3.3–19.4)
Kappa, lambda light chain ratio: 1.15 (ref 0.26–1.65)
Lambda free light chains: 18.4 mg/L (ref 5.7–26.3)

## 2021-12-07 ENCOUNTER — Other Ambulatory Visit: Payer: Self-pay | Admitting: Family

## 2021-12-07 DIAGNOSIS — I10 Essential (primary) hypertension: Secondary | ICD-10-CM

## 2021-12-07 LAB — COMP PANEL: LEUKEMIA/LYMPHOMA

## 2021-12-07 LAB — MULTIPLE MYELOMA PANEL, SERUM
Albumin SerPl Elph-Mcnc: 3.4 g/dL (ref 2.9–4.4)
Albumin/Glob SerPl: 1 (ref 0.7–1.7)
Alpha 1: 0.3 g/dL (ref 0.0–0.4)
Alpha2 Glob SerPl Elph-Mcnc: 0.9 g/dL (ref 0.4–1.0)
B-Globulin SerPl Elph-Mcnc: 1.2 g/dL (ref 0.7–1.3)
Gamma Glob SerPl Elph-Mcnc: 1.2 g/dL (ref 0.4–1.8)
Globulin, Total: 3.6 g/dL (ref 2.2–3.9)
IgA: 265 mg/dL (ref 64–422)
IgG (Immunoglobin G), Serum: 1188 mg/dL (ref 586–1602)
IgM (Immunoglobulin M), Srm: 37 mg/dL (ref 26–217)
Total Protein ELP: 7 g/dL (ref 6.0–8.5)

## 2021-12-13 ENCOUNTER — Other Ambulatory Visit: Payer: Self-pay | Admitting: Family

## 2021-12-13 DIAGNOSIS — N3281 Overactive bladder: Secondary | ICD-10-CM

## 2021-12-13 MED ORDER — TROSPIUM CHLORIDE ER 60 MG PO CP24: 60.0000 mg | ORAL_CAPSULE | Freq: Every morning | ORAL | 1 refills | Status: DC

## 2021-12-14 ENCOUNTER — Other Ambulatory Visit: Payer: Self-pay | Admitting: Surgery

## 2021-12-15 ENCOUNTER — Telehealth: Payer: Self-pay

## 2021-12-15 DIAGNOSIS — R591 Generalized enlarged lymph nodes: Secondary | ICD-10-CM

## 2021-12-15 NOTE — Telephone Encounter (Signed)
-----   Message from Earlie Server, MD sent at 12/14/2021 11:16 PM EDT ----- ?Let her know that lab work up results are good.  ?I recommend her to repeat CT abdomen pelvis in Sep 2023, - lymphadenopathy.  ?And then follow up with me in a few days. Lab cbc cmp thanks.  ?

## 2021-12-15 NOTE — Telephone Encounter (Signed)
Patient informed of results and MD recommendations and verbalized understanding.  ? ?Please schedule CT abd pel in SEPT ?Lab/MD 2-3 days after CT (cbc,cmp) ...ejs  ?

## 2021-12-18 ENCOUNTER — Inpatient Hospital Stay: Admission: RE | Admit: 2021-12-18 | Payer: Medicare Other | Source: Ambulatory Visit

## 2021-12-18 ENCOUNTER — Encounter
Admission: RE | Admit: 2021-12-18 | Discharge: 2021-12-18 | Disposition: A | Discharge status: Home / Self Care | Payer: Medicare Other | Source: Ambulatory Visit | Attending: Surgery | Admitting: Surgery

## 2021-12-18 DIAGNOSIS — Z01818 Encounter for other preprocedural examination: Secondary | ICD-10-CM | POA: Insufficient documentation

## 2021-12-18 DIAGNOSIS — Z01812 Encounter for preprocedural laboratory examination: Secondary | ICD-10-CM

## 2021-12-18 HISTORY — DX: Chronic kidney disease, stage 3a: N18.31

## 2021-12-18 HISTORY — DX: Acute embolism and thrombosis of unspecified deep veins of right lower extremity: I82.401

## 2021-12-18 HISTORY — DX: Other specified postprocedural states: Z98.890

## 2021-12-18 HISTORY — DX: Nonrheumatic mitral (valve) insufficiency: I34.0

## 2021-12-18 HISTORY — DX: Nausea with vomiting, unspecified: R11.2

## 2021-12-18 HISTORY — DX: Pneumonia, unspecified organism: J18.9

## 2021-12-18 HISTORY — DX: Constipation, unspecified: K59.00

## 2021-12-18 LAB — TYPE AND SCREEN
ABO/RH(D): O POS
Antibody Screen: NEGATIVE

## 2021-12-18 LAB — URINALYSIS, ROUTINE W REFLEX MICROSCOPIC
Bilirubin Urine: NEGATIVE
Glucose, UA: NEGATIVE mg/dL
Hgb urine dipstick: NEGATIVE
Ketones, ur: NEGATIVE mg/dL
Leukocytes,Ua: NEGATIVE
Nitrite: NEGATIVE
Protein, ur: NEGATIVE mg/dL
Specific Gravity, Urine: 1.013 (ref 1.005–1.030)
pH: 6 (ref 5.0–8.0)

## 2021-12-18 LAB — SURGICAL PCR SCREEN
MRSA, PCR: NEGATIVE
Staphylococcus aureus: NEGATIVE

## 2021-12-18 NOTE — Patient Instructions (Addendum)
Your procedure is scheduled on: Thursday, May 11 ?Report to the Registration Desk on the 1st floor of the [facility]. ?To find out your arrival time, please call [phone] between 1PM - 3PM on: Wednesday, May 10 ?If your arrival time is 6:00 am, do not arrive prior to that time as the [facility] entrance doors do not open until 6:00 am. ? ?REMEMBER: ?Instructions that are not followed completely may result in serious medical risk, up to and including death; or upon the discretion of your surgeon and anesthesiologist your surgery may need to be rescheduled. ? ?Do not eat food after midnight the night before surgery.  ?No gum chewing, lozengers or hard candies. ? ?You may however, drink CLEAR liquids up to 2 hours before you are scheduled to arrive for your surgery. Do not drink anything within 2 hours of your scheduled arrival time. ? ?Clear liquids include: ?- water  ?- apple juice without pulp ?- gatorade (not RED colors) ?- black coffee or tea (Do NOT add milk or creamers to the coffee or tea) ?Do NOT drink anything that is not on this list. ? ?In addition, your doctor has ordered for you to drink the provided  ?Ensure Pre-Surgery Clear Carbohydrate Drink  ?Drinking this carbohydrate drink up to two hours before surgery helps to reduce insulin resistance and improve patient outcomes. Please complete drinking 2 hours prior to scheduled arrival time. ? ?TAKE THESE MEDICATIONS THE MORNING OF SURGERY WITH A SIP OF WATER: ? ?Metoprolol ? ?One week prior to surgery: starting May 4 ?Stop Anti-inflammatories (NSAIDS) such as Advil, Aleve, Ibuprofen, Motrin, Naproxen, Naprosyn and Aspirin based products such as Excedrin, Goodys Powder, BC Powder. ?Stop ANY OVER THE COUNTER supplements until after surgery. Stop Vitamin D, Vitamin C ?You may however, continue to take Tylenol if needed for pain up until the day of surgery. ? ?No Alcohol for 24 hours before or after surgery. ? ?No Smoking including e-cigarettes for 24  hours prior to surgery.  ?No chewable tobacco products for at least 6 hours prior to surgery.  ?No nicotine patches on the day of surgery. ? ?Do not use any "recreational" drugs for at least a week prior to your surgery.  ?Please be advised that the combination of cocaine and anesthesia may have negative outcomes, up to and including death. ?If you test positive for cocaine, your surgery will be cancelled. ? ?On the morning of surgery brush your teeth with toothpaste and water, you may rinse your mouth with mouthwash if you wish. ?Do not swallow any toothpaste or mouthwash. ? ?Use CHG Soap as directed on instruction sheet. ? ?Do not wear jewelry, make-up, hairpins, clips or nail polish. ? ?Do not wear lotions, powders, or perfumes.  ? ?Do not shave body from the neck down 48 hours prior to surgery just in case you cut yourself which could leave a site for infection.  ?Also, freshly shaved skin may become irritated if using the CHG soap. ? ?Contact lenses, hearing aids and dentures may not be worn into surgery. ? ?Do not bring valuables to the hospital. [facility] is not responsible for any missing/lost belongings or valuables.  ? ?Notify your doctor if there is any change in your medical condition (cold, fever, infection). ? ?Wear comfortable clothing (specific to your surgery type) to the hospital. ? ?After surgery, you can help prevent lung complications by doing breathing exercises.  ?Take deep breaths and cough every 1-2 hours. Your doctor may order a device called  an Chiropodist to help you take deep breaths. ? ?If you are being admitted to the hospital overnight, leave your suitcase in the car. ?After surgery it may be brought to your room. ? ?If you are being discharged the day of surgery, you will not be allowed to drive home. ?You will need a responsible adult (18 years or older) to drive you home and stay with you that night.  ? ?If you are taking public transportation, you will need to have a  responsible adult (18 years or older) with you. ?Please confirm with your physician that it is acceptable to use public transportation.  ? ?Please call the [facility] Dept. at [phone] if you have any questions about these instructions. ? ?Surgery Visitation Policy: ? ?Patients undergoing a surgery or procedure may have two family members or support persons with them as long as the person is not COVID-19 positive or experiencing its symptoms.  ? ?Inpatient Visitation:   ? ?Visiting hours are 7 a.m. to 8 p.m. ?Up to four visitors are allowed at one time in a patient room, including children. The visitors may rotate out with other people during the day. One designated support person (adult) may remain overnight.  ?

## 2021-12-26 ENCOUNTER — Ambulatory Visit
Admission: RE | Admit: 2021-12-26 | Discharge: 2021-12-26 | Disposition: A | Discharge status: Home / Self Care | Payer: Medicare Other | Source: Ambulatory Visit | Attending: Family | Admitting: Family

## 2021-12-26 DIAGNOSIS — Z1231 Encounter for screening mammogram for malignant neoplasm of breast: Secondary | ICD-10-CM | POA: Insufficient documentation

## 2021-12-27 MED ORDER — APREPITANT 40 MG PO CAPS
40.0000 mg | ORAL_CAPSULE | Freq: Once | ORAL | Status: DC
Start: 2021-12-27 — End: 2021-12-28

## 2021-12-27 MED ORDER — FAMOTIDINE 20 MG PO TABS: 20.0000 mg | ORAL_TABLET | Freq: Once | ORAL | Status: AC

## 2021-12-27 MED ORDER — CHLORHEXIDINE GLUCONATE 0.12 % MT SOLN
15.0000 mL | Freq: Once | OROMUCOSAL | Status: AC
Start: 2021-12-27 — End: 2021-12-28

## 2021-12-27 MED ORDER — LACTATED RINGERS IV SOLN: INTRAVENOUS | Status: DC

## 2021-12-27 MED ORDER — ORAL CARE MOUTH RINSE
15.0000 mL | Freq: Once | OROMUCOSAL | Status: AC
Start: 2021-12-27 — End: 2021-12-28

## 2021-12-27 MED ORDER — CEFAZOLIN SODIUM-DEXTROSE 2-4 GM/100ML-% IV SOLN
2.0000 g | INTRAVENOUS | Status: AC
  Administered 2021-12-28: 2 g via INTRAVENOUS
  Filled 2021-12-27: qty 100

## 2021-12-28 ENCOUNTER — Observation Stay: Payer: Medicare Other

## 2021-12-28 ENCOUNTER — Other Ambulatory Visit: Payer: Self-pay

## 2021-12-28 ENCOUNTER — Inpatient Hospital Stay: Payer: Medicare Other | Admitting: Urgent Care

## 2021-12-28 ENCOUNTER — Observation Stay
Admission: RE | Admit: 2021-12-28 | Discharge: 2021-12-29 | Disposition: A | Discharge status: Home Health | Payer: Medicare Other | Attending: Surgery | Admitting: Surgery

## 2021-12-28 ENCOUNTER — Encounter
Admission: RE | Disposition: A | Discharge status: Home Health | Payer: Self-pay | Source: Home / Self Care | Attending: Surgery

## 2021-12-28 ENCOUNTER — Encounter: Payer: Self-pay | Admitting: Surgery

## 2021-12-28 DIAGNOSIS — Z01812 Encounter for preprocedural laboratory examination: Principal | ICD-10-CM

## 2021-12-28 DIAGNOSIS — Z8616 Personal history of COVID-19: Secondary | ICD-10-CM | POA: Diagnosis not present

## 2021-12-28 DIAGNOSIS — Z86718 Personal history of other venous thrombosis and embolism: Secondary | ICD-10-CM | POA: Diagnosis not present

## 2021-12-28 DIAGNOSIS — Z96651 Presence of right artificial knee joint: Secondary | ICD-10-CM

## 2021-12-28 DIAGNOSIS — I129 Hypertensive chronic kidney disease with stage 1 through stage 4 chronic kidney disease, or unspecified chronic kidney disease: Secondary | ICD-10-CM | POA: Diagnosis not present

## 2021-12-28 DIAGNOSIS — R7303 Prediabetes: Secondary | ICD-10-CM | POA: Insufficient documentation

## 2021-12-28 DIAGNOSIS — M1711 Unilateral primary osteoarthritis, right knee: Principal | ICD-10-CM | POA: Insufficient documentation

## 2021-12-28 DIAGNOSIS — N1831 Chronic kidney disease, stage 3a: Secondary | ICD-10-CM | POA: Diagnosis not present

## 2021-12-28 DIAGNOSIS — Z79899 Other long term (current) drug therapy: Secondary | ICD-10-CM | POA: Diagnosis not present

## 2021-12-28 HISTORY — PX: TOTAL KNEE ARTHROPLASTY: SHX125

## 2021-12-28 LAB — ABO/RH: ABO/RH(D): O POS

## 2021-12-28 SURGERY — ARTHROPLASTY, KNEE, TOTAL
Anesthesia: Spinal | Site: Knee | Laterality: Right

## 2021-12-28 MED ORDER — OXYMETAZOLINE HCL 0.05 % NA SOLN
NASAL | Status: AC
  Filled 2021-12-28: qty 30

## 2021-12-28 MED ORDER — ONDANSETRON HCL 4 MG PO TABS: 4.0000 mg | ORAL_TABLET | Freq: Four times a day (QID) | ORAL | Status: DC | PRN

## 2021-12-28 MED ORDER — 0.9 % SODIUM CHLORIDE (POUR BTL) OPTIME
TOPICAL | Status: DC | PRN
  Administered 2021-12-28: 500 mL

## 2021-12-28 MED ORDER — ONDANSETRON HCL 4 MG/2ML IJ SOLN: 4.0000 mg | Freq: Once | INTRAMUSCULAR | Status: DC | PRN

## 2021-12-28 MED ORDER — ACETAMINOPHEN 10 MG/ML IV SOLN
INTRAVENOUS | Status: DC | PRN
  Administered 2021-12-28: 1000 mg via INTRAVENOUS

## 2021-12-28 MED ORDER — GLYCOPYRROLATE 0.2 MG/ML IJ SOLN
INTRAMUSCULAR | Status: DC | PRN
  Administered 2021-12-28: .2 mg via INTRAVENOUS

## 2021-12-28 MED ORDER — SODIUM CHLORIDE 0.9 % IV SOLN
INTRAVENOUS | Status: DC | PRN
  Administered 2021-12-28: 60 mL

## 2021-12-28 MED ORDER — ONDANSETRON HCL 4 MG/2ML IJ SOLN: 4.0000 mg | Freq: Four times a day (QID) | INTRAMUSCULAR | Status: DC | PRN

## 2021-12-28 MED ORDER — BUPIVACAINE-EPINEPHRINE (PF) 0.5% -1:200000 IJ SOLN
INTRAMUSCULAR | Status: DC | PRN
  Administered 2021-12-28: 30 mL via PERINEURAL

## 2021-12-28 MED ORDER — METOPROLOL SUCCINATE ER 50 MG PO TB24
50.0000 mg | ORAL_TABLET | Freq: Every day | ORAL | Status: DC
  Administered 2021-12-29: 50 mg via ORAL
  Filled 2021-12-28: qty 1

## 2021-12-28 MED ORDER — OXYCODONE HCL 5 MG PO TABS: 5.0000 mg | ORAL_TABLET | Freq: Once | ORAL | Status: DC | PRN

## 2021-12-28 MED ORDER — BUPIVACAINE-EPINEPHRINE (PF) 0.5% -1:200000 IJ SOLN
INTRAMUSCULAR | Status: AC
  Filled 2021-12-28: qty 30

## 2021-12-28 MED ORDER — OXYCODONE HCL 5 MG PO TABS
5.0000 mg | ORAL_TABLET | ORAL | Status: DC | PRN
  Administered 2021-12-28: 5 mg via ORAL
  Administered 2021-12-28: 10 mg via ORAL
  Administered 2021-12-29: 5 mg via ORAL
  Filled 2021-12-28 (×2): qty 1
  Filled 2021-12-28: qty 2

## 2021-12-28 MED ORDER — SODIUM CHLORIDE FLUSH 0.9 % IV SOLN
INTRAVENOUS | Status: AC
  Filled 2021-12-28: qty 40

## 2021-12-28 MED ORDER — FENTANYL CITRATE (PF) 100 MCG/2ML IJ SOLN
INTRAMUSCULAR | Status: DC | PRN
Start: 2021-12-28 — End: 2021-12-28
  Administered 2021-12-28: 25 ug via INTRAVENOUS

## 2021-12-28 MED ORDER — HYDROMORPHONE HCL 1 MG/ML IJ SOLN: 0.5000 mg | INTRAMUSCULAR | Status: DC | PRN

## 2021-12-28 MED ORDER — ACETAMINOPHEN 325 MG PO TABS: 325.0000 mg | ORAL_TABLET | Freq: Four times a day (QID) | ORAL | Status: DC | PRN

## 2021-12-28 MED ORDER — DIPHENHYDRAMINE HCL 12.5 MG/5ML PO ELIX: 12.5000 mg | ORAL_SOLUTION | ORAL | Status: DC | PRN

## 2021-12-28 MED ORDER — BUPIVACAINE HCL (PF) 0.5 % IJ SOLN
INTRAMUSCULAR | Status: DC | PRN
  Administered 2021-12-28: 2.5 mL via INTRATHECAL

## 2021-12-28 MED ORDER — PHENYLEPHRINE HCL-NACL 20-0.9 MG/250ML-% IV SOLN
INTRAVENOUS | Status: DC | PRN
Start: 2021-12-28 — End: 2021-12-28
  Administered 2021-12-28: 15 ug/min via INTRAVENOUS

## 2021-12-28 MED ORDER — ONDANSETRON HCL 4 MG/2ML IJ SOLN
INTRAMUSCULAR | Status: DC | PRN
  Administered 2021-12-28: 4 mg via INTRAVENOUS

## 2021-12-28 MED ORDER — SODIUM CHLORIDE 0.9 % IV SOLN: INTRAVENOUS | Status: DC

## 2021-12-28 MED ORDER — DOCUSATE SODIUM 100 MG PO CAPS
100.0000 mg | ORAL_CAPSULE | Freq: Two times a day (BID) | ORAL | Status: DC
  Administered 2021-12-28 – 2021-12-29 (×3): 100 mg via ORAL
  Filled 2021-12-28 (×3): qty 1

## 2021-12-28 MED ORDER — ASCORBIC ACID 500 MG PO TABS
500.0000 mg | ORAL_TABLET | Freq: Every day | ORAL | Status: DC
  Administered 2021-12-28 – 2021-12-29 (×2): 500 mg via ORAL
  Filled 2021-12-28 (×2): qty 1

## 2021-12-28 MED ORDER — DEXAMETHASONE SODIUM PHOSPHATE 10 MG/ML IJ SOLN
INTRAMUSCULAR | Status: DC | PRN
  Administered 2021-12-28: 10 mg via INTRAVENOUS

## 2021-12-28 MED ORDER — MAGNESIUM HYDROXIDE 400 MG/5ML PO SUSP
30.0000 mL | Freq: Every day | ORAL | Status: DC | PRN
  Administered 2021-12-29: 30 mL via ORAL
  Filled 2021-12-28: qty 30

## 2021-12-28 MED ORDER — BISACODYL 10 MG RE SUPP: 10.0000 mg | Freq: Every day | RECTAL | Status: DC | PRN

## 2021-12-28 MED ORDER — APREPITANT 40 MG PO CAPS
ORAL_CAPSULE | ORAL | Status: AC
  Filled 2021-12-28: qty 1

## 2021-12-28 MED ORDER — KETOROLAC TROMETHAMINE 15 MG/ML IJ SOLN
INTRAMUSCULAR | Status: AC
  Filled 2021-12-28: qty 1

## 2021-12-28 MED ORDER — DARIFENACIN HYDROBROMIDE ER 7.5 MG PO TB24
15.0000 mg | ORAL_TABLET | Freq: Every morning | ORAL | Status: DC
  Administered 2021-12-28 – 2021-12-29 (×2): 15 mg via ORAL
  Filled 2021-12-28 (×2): qty 2

## 2021-12-28 MED ORDER — FAMOTIDINE 20 MG PO TABS
ORAL_TABLET | ORAL | Status: AC
  Administered 2021-12-28: 20 mg via ORAL
  Filled 2021-12-28: qty 1

## 2021-12-28 MED ORDER — APIXABAN 2.5 MG PO TABS
2.5000 mg | ORAL_TABLET | Freq: Two times a day (BID) | ORAL | Status: DC
Start: 2021-12-29 — End: 2021-12-29
  Administered 2021-12-29: 2.5 mg via ORAL
  Filled 2021-12-28: qty 1

## 2021-12-28 MED ORDER — CHLORHEXIDINE GLUCONATE 0.12 % MT SOLN
OROMUCOSAL | Status: AC
  Administered 2021-12-28: 15 mL via OROMUCOSAL
  Filled 2021-12-28: qty 15

## 2021-12-28 MED ORDER — METOCLOPRAMIDE HCL 5 MG PO TABS
5.0000 mg | ORAL_TABLET | Freq: Three times a day (TID) | ORAL | Status: DC | PRN
  Filled 2021-12-28: qty 2

## 2021-12-28 MED ORDER — FENTANYL CITRATE (PF) 100 MCG/2ML IJ SOLN: 25.0000 ug | INTRAMUSCULAR | Status: DC | PRN

## 2021-12-28 MED ORDER — OXYCODONE HCL 5 MG/5ML PO SOLN: 5.0000 mg | Freq: Once | ORAL | Status: DC | PRN

## 2021-12-28 MED ORDER — PHENYLEPHRINE 80 MCG/ML (10ML) SYRINGE FOR IV PUSH (FOR BLOOD PRESSURE SUPPORT)
PREFILLED_SYRINGE | INTRAVENOUS | Status: DC | PRN
  Administered 2021-12-28 (×2): 80 ug via INTRAVENOUS

## 2021-12-28 MED ORDER — BUPIVACAINE LIPOSOME 1.3 % IJ SUSP
INTRAMUSCULAR | Status: AC
  Filled 2021-12-28: qty 20

## 2021-12-28 MED ORDER — KETOROLAC TROMETHAMINE 15 MG/ML IJ SOLN
7.5000 mg | Freq: Four times a day (QID) | INTRAMUSCULAR | Status: DC
  Administered 2021-12-28 – 2021-12-29 (×3): 7.5 mg via INTRAVENOUS
  Filled 2021-12-28 (×3): qty 1

## 2021-12-28 MED ORDER — TRANEXAMIC ACID 1000 MG/10ML IV SOLN
INTRAVENOUS | Status: AC
  Filled 2021-12-28: qty 10

## 2021-12-28 MED ORDER — VITAMIN D 25 MCG (1000 UNIT) PO TABS
1000.0000 [IU] | ORAL_TABLET | Freq: Every day | ORAL | Status: DC
  Administered 2021-12-28 – 2021-12-29 (×2): 1000 [IU] via ORAL
  Filled 2021-12-28 (×2): qty 1

## 2021-12-28 MED ORDER — AMLODIPINE BESYLATE 5 MG PO TABS
5.0000 mg | ORAL_TABLET | Freq: Every day | ORAL | Status: DC
  Administered 2021-12-28: 5 mg via ORAL
  Filled 2021-12-28: qty 1

## 2021-12-28 MED ORDER — LOSARTAN POTASSIUM 25 MG PO TABS
25.0000 mg | ORAL_TABLET | Freq: Every day | ORAL | Status: DC
  Administered 2021-12-28 – 2021-12-29 (×2): 25 mg via ORAL
  Filled 2021-12-28 (×2): qty 1

## 2021-12-28 MED ORDER — POLYETHYLENE GLYCOL 3350 17 G PO PACK
17.0000 g | PACK | Freq: Every day | ORAL | Status: DC
  Administered 2021-12-29: 17 g via ORAL
  Filled 2021-12-28: qty 1

## 2021-12-28 MED ORDER — PROPOFOL 500 MG/50ML IV EMUL
INTRAVENOUS | Status: DC | PRN
Start: 2021-12-28 — End: 2021-12-28
  Administered 2021-12-28: 75 ug/kg/min via INTRAVENOUS

## 2021-12-28 MED ORDER — ACETAMINOPHEN 500 MG PO TABS
1000.0000 mg | ORAL_TABLET | Freq: Four times a day (QID) | ORAL | Status: DC
  Administered 2021-12-28 – 2021-12-29 (×3): 1000 mg via ORAL
  Filled 2021-12-28 (×3): qty 2

## 2021-12-28 MED ORDER — METOCLOPRAMIDE HCL 5 MG/ML IJ SOLN: 5.0000 mg | Freq: Three times a day (TID) | INTRAMUSCULAR | Status: DC | PRN

## 2021-12-28 MED ORDER — FENTANYL CITRATE (PF) 100 MCG/2ML IJ SOLN
INTRAMUSCULAR | Status: AC
  Filled 2021-12-28: qty 2

## 2021-12-28 MED ORDER — TRANEXAMIC ACID 1000 MG/10ML IV SOLN
INTRAVENOUS | Status: DC | PRN
  Administered 2021-12-28: 1000 mg via TOPICAL

## 2021-12-28 MED ORDER — FLEET ENEMA 7-19 GM/118ML RE ENEM: 1.0000 | ENEMA | Freq: Once | RECTAL | Status: DC | PRN

## 2021-12-28 MED ORDER — ACETAMINOPHEN 10 MG/ML IV SOLN: 1000.0000 mg | Freq: Once | INTRAVENOUS | Status: DC | PRN

## 2021-12-28 MED ORDER — MIDAZOLAM HCL 2 MG/2ML IJ SOLN
INTRAMUSCULAR | Status: AC
Start: 2021-12-28 — End: ?
  Filled 2021-12-28: qty 2

## 2021-12-28 MED ORDER — KETOROLAC TROMETHAMINE 15 MG/ML IJ SOLN
15.0000 mg | Freq: Once | INTRAMUSCULAR | Status: AC
  Administered 2021-12-28: 15 mg via INTRAVENOUS

## 2021-12-28 MED ORDER — CEFAZOLIN SODIUM-DEXTROSE 2-4 GM/100ML-% IV SOLN
2.0000 g | Freq: Four times a day (QID) | INTRAVENOUS | Status: AC
  Administered 2021-12-28 – 2021-12-29 (×3): 2 g via INTRAVENOUS
  Filled 2021-12-28 (×3): qty 100

## 2021-12-28 MED ORDER — MIDAZOLAM HCL 5 MG/5ML IJ SOLN
INTRAMUSCULAR | Status: DC | PRN
  Administered 2021-12-28: 1 mg via INTRAVENOUS

## 2021-12-28 SURGICAL SUPPLY — 61 items
BIT DRILL QUICK REL 1/8 2PK SL (DRILL) IMPLANT
BLADE SAW SAG 25X90X1.19 (BLADE) ×2 IMPLANT
BLADE SURG SZ20 CARB STEEL (BLADE) ×2 IMPLANT
BNDG ELASTIC 6X5.8 VLCR NS LF (GAUZE/BANDAGES/DRESSINGS) ×2 IMPLANT
CEMENT BONE R 1X40 (Cement) ×4 IMPLANT
CEMENT VACUUM MIXING SYSTEM (MISCELLANEOUS) ×2 IMPLANT
CHLORAPREP W/TINT 26 (MISCELLANEOUS) ×3 IMPLANT
COMPONENT PATELLAR VGD 7.8X3 (Joint) ×1 IMPLANT
COOLER POLAR GLACIER W/PUMP (MISCELLANEOUS) ×2 IMPLANT
COVER MAYO STAND REUSABLE (DRAPES) ×2 IMPLANT
CUFF TOURN SGL QUICK 24 (TOURNIQUET CUFF)
CUFF TOURN SGL QUICK 34 (TOURNIQUET CUFF)
CUFF TRNQT CYL 24X4X16.5-23 (TOURNIQUET CUFF) IMPLANT
CUFF TRNQT CYL 34X4.125X (TOURNIQUET CUFF) IMPLANT
DRAPE 3/4 80X56 (DRAPES) ×2 IMPLANT
DRAPE IMP U-DRAPE 54X76 (DRAPES) ×2 IMPLANT
DRAPE U-SHAPE 47X51 STRL (DRAPES) ×2 IMPLANT
DRILL QUICK RELEASE 1/8 INCH (DRILL) ×3
DRSG MEPILEX SACRM 8.7X9.8 (GAUZE/BANDAGES/DRESSINGS) ×1 IMPLANT
DRSG OPSITE POSTOP 4X10 (GAUZE/BANDAGES/DRESSINGS) ×1 IMPLANT
DRSG OPSITE POSTOP 4X8 (GAUZE/BANDAGES/DRESSINGS) ×2 IMPLANT
ELECT CAUTERY BLADE 6.4 (BLADE) ×1 IMPLANT
ELECT REM PT RETURN 9FT ADLT (ELECTROSURGICAL) ×2
ELECTRODE REM PT RTRN 9FT ADLT (ELECTROSURGICAL) ×1 IMPLANT
FEMORAL CR RIGHT 67.5MM (Joint) ×1 IMPLANT
GAUZE XEROFORM 1X8 LF (GAUZE/BANDAGES/DRESSINGS) ×2 IMPLANT
GLOVE BIO SURGEON STRL SZ7.5 (GLOVE) ×8 IMPLANT
GLOVE BIO SURGEON STRL SZ8 (GLOVE) ×8 IMPLANT
GLOVE BIOGEL PI IND STRL 8 (GLOVE) ×1 IMPLANT
GLOVE BIOGEL PI INDICATOR 8 (GLOVE) ×1
GLOVE SURG UNDER LTX SZ8 (GLOVE) ×2 IMPLANT
GOWN STRL REUS W/ TWL LRG LVL3 (GOWN DISPOSABLE) ×1 IMPLANT
GOWN STRL REUS W/ TWL XL LVL3 (GOWN DISPOSABLE) ×1 IMPLANT
GOWN STRL REUS W/TWL LRG LVL3 (GOWN DISPOSABLE) ×1
GOWN STRL REUS W/TWL XL LVL3 (GOWN DISPOSABLE) ×1
HOOD PEEL AWAY FLYTE STAYCOOL (MISCELLANEOUS) ×6 IMPLANT
INSERT TIB BEARING 71 10 (Insert) ×1 IMPLANT
IV NS IRRIG 3000ML ARTHROMATIC (IV SOLUTION) ×2 IMPLANT
KIT TURNOVER KIT A (KITS) ×2 IMPLANT
MANIFOLD NEPTUNE II (INSTRUMENTS) ×2 IMPLANT
NDL SPNL 20GX3.5 QUINCKE YW (NEEDLE) ×1 IMPLANT
NEEDLE SPNL 20GX3.5 QUINCKE YW (NEEDLE) ×4 IMPLANT
NS IRRIG 1000ML POUR BTL (IV SOLUTION) ×2 IMPLANT
PACK TOTAL KNEE (MISCELLANEOUS) ×2 IMPLANT
PAD WRAPON POLAR KNEE (MISCELLANEOUS) ×1 IMPLANT
PENCIL SMOKE EVACUATOR (MISCELLANEOUS) ×2 IMPLANT
PLATE KNEE TIBIAL 71MM FIXED (Plate) ×1 IMPLANT
PULSAVAC PLUS IRRIG FAN TIP (DISPOSABLE) ×2
STAPLER SKIN PROX 35W (STAPLE) ×2 IMPLANT
SUCTION FRAZIER HANDLE 10FR (MISCELLANEOUS) ×1
SUCTION TUBE FRAZIER 10FR DISP (MISCELLANEOUS) ×1 IMPLANT
SUT VIC AB 0 CT1 36 (SUTURE) ×6 IMPLANT
SUT VIC AB 2-0 CT1 27 (SUTURE) ×3
SUT VIC AB 2-0 CT1 TAPERPNT 27 (SUTURE) ×3 IMPLANT
SYR 10ML LL (SYRINGE) ×2 IMPLANT
SYR 20ML LL LF (SYRINGE) ×2 IMPLANT
SYR 30ML LL (SYRINGE) IMPLANT
TIP FAN IRRIG PULSAVAC PLUS (DISPOSABLE) ×1 IMPLANT
TRAP FLUID SMOKE EVACUATOR (MISCELLANEOUS) ×2 IMPLANT
WATER STERILE IRR 500ML POUR (IV SOLUTION) ×2 IMPLANT
WRAPON POLAR PAD KNEE (MISCELLANEOUS) ×2

## 2021-12-28 NOTE — Plan of Care (Signed)
?  Problem: Clinical Measurements: Goal: Will remain free from infection Outcome: Progressing   Problem: Activity: Goal: Risk for activity intolerance will decrease Outcome: Progressing   Problem: Nutrition: Goal: Adequate nutrition will be maintained Outcome: Progressing   Problem: Coping: Goal: Level of anxiety will decrease Outcome: Progressing   Problem: Elimination: Goal: Will not experience complications related to bowel motility Outcome: Progressing Goal: Will not experience complications related to urinary retention Outcome: Progressing   

## 2021-12-28 NOTE — H&P (Signed)
History of Present Illness: ?Donna Ferguson is a 81 y.o. female who presents today for repeat evaluation of ongoing right knee pain. The patient has a history of severe right knee osteoarthritic changes, she was scheduled to undergo a right total knee arthroplasty in the past however her surgery was delayed on numerous times due to dental issues and COVID. The patient was last evaluated in December where she still did not wish to undergo any surgical intervention at this time. Because of this she was offered and received a right knee steroid injection. She states that this injection did provide moderate relief of her right knee pain however she states that in less than 3 months the pain began to return and she is now having difficulty with doing her routine activities such as walking and standing for prolonged period time. The patient reports a 6 out of 10 pain score in the right knee, pain is located both along the medial lateral aspect of the knee. She is status post a left total knee arthroplasty performed in 2017. The patient did not have any issues while recovering from her left total knee arthroplasty in the past. The patient is quite frustrated by her continued right knee pain and would like to discuss potentially more aggressive treatment options. She denies any personal history of heart attack, stroke, asthma or COPD. No personal history of blood clots for the patient. The patient states that she does have a history of a heart murmur which is followed by her primary care physician. She denies any numbness or tingling to the right lower extremity at today's visit. ? ?Past Medical History: ? Arthritis  ? Heart murmur, unspecified (in the past)  ? Hypertension  ? IBS (irritable bowel syndrome)  ? ?Past Surgical History: ? REPLACEMENT TOTAL KNEE Left 2017  ? ABDOMINAL HYSTERECTOMY  ? cyst removal in breast Bilateral  ? ?Past Family History: ? High blood pressure (Hypertension) Mother  ? Diabetes Father  ? Heart  disease Father  ? High blood pressure (Hypertension) Father  ? High blood pressure (Hypertension) Sister  ? High blood pressure (Hypertension) Daughter  ? Glaucoma Neg Hx  ? Macular degeneration Neg Hx  ? ?Medications: ? acetaminophen (TYLENOL) 500 MG tablet Take 500 mg by mouth as needed for Pain  ? amLODIPine (NORVASC) 5 MG tablet Take by mouth Take 1 tablet (5 mg total) by mouth every evening.  ? ascorbic acid, vitamin C, (VITAMIN C) 1000 MG tablet Take by mouth Take 1,000 mg by mouth daily.  ? calcium carbonate/vitamin D3 (VITAMIN D3/CALCIUM CARBONATE ORAL) Take 1 tablet by mouth once daily  ? cholecalciferol (VITAMIN D3) 1000 unit capsule Take by mouth Take 1,000 Units by mouth 1 (one) time each day  ? hydrocortisone 2.5 % cream Apply topically 2 (two) times daily 30 g 0  ? losartan (COZAAR) 25 MG tablet Take 1 tablet by mouth once daily  ? metoprolol succinate (TOPROL-XL) 50 MG XL tablet Take by mouth Take 50 mg by mouth daily. Take with or immediately following a meal.  ? naproxen sodium (ALEVE) 220 MG tablet Take 440 mg by mouth as needed for Pain  ? trospium (SANCTURA) 20 mg tablet Take 20 mg by mouth 2 (two) times daily  ? oxybutynin (DITROPAN-XL) 10 MG XL tablet Take 10 mg by mouth once daily (Patient not taking: Reported on 12/06/2021)  ? ?Allergies: ?No Known Allergies  ? ?Review of Systems:  ?A comprehensive 14 point ROS was performed, reviewed by me today, and  the pertinent orthopaedic findings are documented in the HPI. ? ?Physical Exam: ?BP 134/82  Ht 160 cm ('5\' 3"'$ )  Wt 75.4 kg (166 lb 3.2 oz)  BMI 29.44 kg/m?  ?General/Constitutional: The patient appears to be well-nourished, well-developed, and in no acute distress. ?Neuro/Psych: Normal mood and affect, oriented to person, place and time. ?Eyes: Non-icteric. Pupils are equal, round, and reactive to light, and exhibit synchronous movement. ?ENT: Unremarkable. ?Lymphatic: No palpable adenopathy. ?Respiratory: Lungs clear to auscultation, Normal  chest excursion, No wheezes and Non-labored breathing ?Cardiovascular: Regular rate and rhythm. No murmurs. and No edema, swelling or tenderness, except as noted in detailed exam. ?Integumentary: No impressive skin lesions present, except as noted in detailed exam. ?Musculoskeletal: Unremarkable, except as noted in detailed exam. ? ?The patient today walking with a mild limp favoring the right leg. She is not using any assistive devices for ambulation. Skin examination of the right knee does a mild effusion present, no erythema of the wound or ecchymosis.  Examination demonstrates a knee with mild valgus alignment.  She is tender to palpation over the lateral joint line, mild tenderness with palpation along the medial joint line.  She is able to achieve -5 degrees extension and 115 degrees flexion with a equivocal McMurray's test.  No significant patellar crepitus, she has a negative Lachman's test.  The right knee is stable to both varus and valgus stress testing. Intact light touch to the right lower extremity. Cap refills intact each individual toe. Dorsalis pedis and posterior tibialis pulse intact to the right lower extremity. ? ?Imaging: ?X-rays were obtained by the patient's primary care provider of her right knee on 11/17/2021. These x-rays demonstrate significant osteoarthritic changes with complete loss of lateral joint space and distances in the supine formation and near complete loss of medial joint space as well. The patient does not have a fracture, no evidence of dislocation. Moderate arthritic changes to the patella as well with osteophyte formation over the superior and inferior aspect of the patella. ? ?Impression: ?Primary osteoarthritis of right knee. ? ?Plan:  ?1. Treatment options were discussed today with the patient. ?2. The patient is quite frustrated by her continued right knee discomfort at this time. The patient is ready to discuss more aggressive treatment options. ?3. After discussion of  the risk and benefits the patient would like to proceed with a right total knee arthroplasty with Dr. Roland Rack in the future. ?4. This document will serve as a surgical history and physical for the patient's upcoming surgery. ?5. The patient will follow-up per standard postop protocol. They can call the clinic they have any questions, new symptoms develop or symptoms worsen. ? ?The procedure was discussed with the patient, as were the potential risks (including bleeding, infection, nerve and/or blood vessel injury, persistent or recurrent pain, failure of the hardware, instability, need for further surgery, blood clots, strokes, heart attacks and/or arhythmias, pneumonia, etc.) and benefits. The patient states her understanding and wishes to proceed. ? ? ?H&P reviewed and patient re-examined. No changes. ? ?

## 2021-12-28 NOTE — Anesthesia Preprocedure Evaluation (Addendum)
Anesthesia Evaluation  ?Patient identified by MRN, date of birth, ID band ?Patient awake ? ? ? ?Reviewed: ?Allergy & Precautions, NPO status , Patient's Chart, lab work & pertinent test results ? ?History of Anesthesia Complications ?Negative for: history of anesthetic complications ? ?Airway ?Mallampati: III ? ?TM Distance: >3 FB ?Neck ROM: Full ? ? ? Dental ?no notable dental hx. ?(+) Teeth Intact, Implants ?  ?Pulmonary ?neg pulmonary ROS, neg sleep apnea, neg COPD, Patient abstained from smoking.Not current smoker,  ?  ?Pulmonary exam normal ?breath sounds clear to auscultation ? ? ? ? ? ? Cardiovascular ?Exercise Tolerance: Good ?METShypertension, Pt. on medications ?(-) CAD and (-) Past MI (-) dysrhythmias  ?Rhythm:Regular Rate:Normal ?- Systolic murmurs ??1. Left ventricular ejection fraction, by estimation, is 60 to 65%. The  ?left ventricle has normal function. The left ventricle has no regional  ?wall motion abnormalities. There is mild left ventricular hypertrophy.  ?Left ventricular diastolic parameters  ?are consistent with Grade I diastolic dysfunction (impaired relaxation).  ??2. Right ventricular systolic function is normal. The right ventricular  ?size is normal. There is normal pulmonary artery systolic pressure.  ??3. The mitral valve is normal in structure. Trivial mitral valve  ?regurgitation. No evidence of mitral stenosis.  ??4. The aortic valve is normal in structure. Aortic valve regurgitation is  ?not visualized. Mild to moderate aortic valve sclerosis/calcification is  ?present, without any evidence of aortic stenosis.  ??5. The inferior vena cava is normal in size with greater than 50%  ?respiratory variability, suggesting right atrial pressure of 3 mmHg.  ?  ?Neuro/Psych ?negative neurological ROS ? negative psych ROS  ? GI/Hepatic ?neg GERD  ,(+)  ?  ? (-) substance abuse ? ,   ?Endo/Other  ?neg diabetes ? Renal/GU ?CRFRenal disease  ? ?   ?Musculoskeletal ? ?(+) Arthritis ,  ? Abdominal ?  ?Peds ? Hematology ?  ?Anesthesia Other Findings ?Past Medical History: ?No date: Arthritis ?No date: Chronic kidney disease, stage 3a ([place]) ?    Comment:  cyst near kidney ?No date: Constipation ?No date: Heart murmur ?    Comment:  past ?No date: History of colonoscopy ?    Comment:  2019 in [place]; patient states no longer screening for colon ?             cancer. ?No date: Hypertension ?No date: IBS (irritable bowel syndrome) ?No date: Mitral valve insufficiency ?No date: Pneumonia ?No date: PONV (postoperative nausea and vomiting) ?No date: Pre-diabetes ?No date: Renal cyst ?No date: Right leg DVT ([place]) ? Reproductive/Obstetrics ? ?  ? ? ? ? ? ? ? ? ? ? ? ? ? ?  ?  ? ? ? ? ? ? ? ?Anesthesia Physical ?Anesthesia Plan ? ?ASA: 2 ? ?Anesthesia Plan: Spinal  ? ?Post-op Pain Management: Ofirmev IV (intra-op)*  ? ?Induction: Intravenous ? ?PONV Risk Score and Plan: 2 and Ondansetron, Dexamethasone, Propofol infusion, TIVA and Treatment may vary due to age or medical condition ? ?Airway Management Planned: Natural Airway ? ?Additional Equipment: None ? ?Intra-op Plan:  ? ?Post-operative Plan:  ? ?Informed Consent: I have reviewed the patients History and Physical, chart, labs and discussed the procedure including the risks, benefits and alternatives for the proposed anesthesia with the patient or authorized representative who has indicated his/her understanding and acceptance.  ? ? ? ? ? ?Plan Discussed with: CRNA and Surgeon ? ?Anesthesia Plan Comments: (Discussed R/B/A of neuraxial anesthesia technique with patient: ?- rare risks of spinal/epidural hematoma,  nerve damage, infection ?- Risk of PDPH ?- Risk of nausea and vomiting ?- Risk of conversion to general anesthesia and its associated risks, including sore throat, damage to lips/eyes/teeth/oropharynx, and rare risks such as cardiac and respiratory events. ?- Risk of allergic reactions ? ?Discussed the role of CRNA  in patient's perioperative care. ? ?Patient voiced understanding.)  ? ? ? ? ? ? ?Anesthesia Quick Evaluation ? ?

## 2021-12-28 NOTE — Anesthesia Procedure Notes (Signed)
Spinal ? ?Patient location during procedure: OR ?Reason for block: surgical anesthesia ?Staffing ?Performed: anesthesiologist  ?Anesthesiologist: Arita Miss, MD ?Preanesthetic Checklist ?Completed: patient identified, IV checked, site marked, risks and benefits discussed, surgical consent, monitors and equipment checked, pre-op evaluation and timeout performed ?Spinal Block ?Patient position: sitting ?Prep: ChloraPrep and site prepped and draped ?Patient monitoring: heart rate, continuous pulse ox, blood pressure and cardiac monitor ?Approach: midline ?Location: L3-4 ?Injection technique: single-shot ?Needle ?Needle type: Whitacre and Introducer  ?Needle gauge: 24 G ?Needle length: 9 cm ?Assessment ?Sensory level: T10 ?Events: CSF return ?Additional Notes ?Meticulous sterile technique used throughout (CHG prep, sterile gloves, sterile drape). Negative paresthesia. Negative blood return. Positive free-flowing CSF. Expiration date of kit checked and confirmed. Patient tolerated procedure well, without complications. ? ? ? ?

## 2021-12-28 NOTE — Op Note (Signed)
12/28/2021 ? ?12:43 PM ? ?Patient:   Donna Ferguson ? ?Pre-Op Diagnosis:   Degenerative joint disease, right knee. ? ?Post-Op Diagnosis:   Same ? ?Procedure:   Right TKA using all-cemented [employer] Vanguard system with a 67.5 mm mm PCR femur, a 71 mm tibial tray with a 10 mm anterior stabilized E-poly insert, and a 34 x 7.8 mm all-poly 3-pegged domed patella. ? ?Surgeon:   Pascal Lux, MD ? ?Assistant:   Cameron Proud, PA-C  ? ?Anesthesia:   Spinal ? ?Findings:   As above ? ?Complications:   None ? ?EBL:   10 cc ? ?Fluids:   700 cc crystalloid ? ?UOP:   None ? ?TT:   85 minutes at 300 mmHg ? ?Drains:   None ? ?Closure:   Staples ? ?Implants:   As above ? ?Brief Clinical Note:   The patient is a 81 year old female with a long history of progressively worsening right knee pain. The patient's symptoms have progressed despite medications, activity modification, injections, etc. The patient's history and examination were consistent with advanced degenerative joint disease of the right knee confirmed by plain radiographs. The patient presents at this time for a right total knee arthroplasty. ? ?Procedure:   The patient was brought into the operating room. After adequate spinal anesthesia was obtained, the patient was lain in the supine position before the right lower extremity was prepped with ChloraPrep solution and draped sterilely. Preoperative antibiotics were administered. A timeout was performed to verify the appropriate surgical site before the limb was exsanguinated with an Esmarch and the tourniquet inflated to 300 mmHg.  ? ?A standard anterior approach to the knee was made through an approximately 7 inch incision. The incision was carried down through the subcutaneous tissues to expose superficial retinaculum. This was split the length of the incision and the medial flap elevated sufficiently to expose the medial retinaculum. The medial retinaculum was incised, leaving a 3-4 mm cuff of tissue on the patella.  This was extended distally along the medial border of the patellar tendon and proximally through the medial third of the quadriceps tendon. A subtotal fat pad excision was performed before the soft tissues were elevated off the anteromedial and anterolateral aspects of the proximal tibia to the level of the collateral ligaments. The anterior portions of the medial and lateral menisci were removed, as was the anterior cruciate ligament. With the knee flexed to 90?, the external tibial guide was positioned and the appropriate proximal tibial cut made. This piece was taken to the back table where it was measured and found to be optimally replicated by a 71 mm component. ? ?Attention was directed to the distal femur. The intramedullary canal was accessed through a 3/8" drill hole. The intramedullary guide was inserted and positioned in order to obtain a neutral flexion gap. The intercondylar block was positioned with care taken to avoid notching the anterior cortex of the femur. The appropriate cut was made. Next, the distal cutting block was placed at 6? of valgus alignment. Using the 9 mm slot, the distal cut was made. The distal femur was measured and found to be optimally replicated by the 09.3 mm component. The 67.5 mm 4-in-1 cutting block was positioned and first the posterior, then the posterior chamfer, the anterior chamfer, and finally the anterior cuts were made. At this point, the posterior portions medial and lateral menisci were removed. A trial reduction was performed using the appropriate femoral and tibial components with the 10 mm insert. This  demonstrated excellent stability to varus and valgus stressing both in flexion and extension while permitting full extension. Patella tracking was assessed and found to be excellent. Therefore, the tibial guide position was marked on the proximal tibia. The patella thickness was measured and found to be 21 mm. Therefore, the appropriate cut was made. The patellar  surface was measured and found to be optimally replicated by the 34 mm component. The three peg holes were drilled in place before the trial button was inserted. Patella tracking was assessed and found to be excellent, passing the "no thumb test". The lug holes were drilled into the distal femur before the trial component was removed, leaving only the tibial tray. The keel was then created using the appropriate tower, reamer, and punch. ? ?The bony surfaces were prepared for cementing by irrigating them thoroughly with sterile saline solution via the jet lavage system. A bone plug was fashioned from some of the bone that had been removed previously and used to plug the distal femoral canal. In addition, 20 cc of Exparel diluted out to 60 cc with normal saline and 30 cc of 0.5% Sensorcaine were injected into the postero-medial and postero-lateral aspects of the knee, the medial and lateral gutter regions, and the peri-incisional tissues to help with postoperative analgesia. Meanwhile, the cement was being mixed on the back table. When it was ready, the tibial tray was cemented in first. The excess cement was removed using [employer] fast streamer. Next, the femoral component was impacted into place. Again, the excess cement was removed using [employer] fast streamer. The 10 mm trial insert was positioned and the knee brought into extension while the cement hardened. Finally, the patella was cemented into place and secured using the patellar clamp. Again, the excess cement was removed using [employer] fast streamer. Once the cement had hardened, the knee was placed through a range of motion with the findings as described above. Therefore, the trial insert was removed and, after verifying that no cement had been retained posteriorly, the permanent 10 mm anterior stabilized E-polyethylene insert was positioned and secured using the appropriate key locking mechanism. Again the knee was placed through a range of motion with the findings as  described above. ? ?The wound was copiously irrigated with sterile saline solution using the jet lavage system before the quadriceps tendon and retinacular layer were reapproximated using #0 Vicryl interrupted sutures. The superficial retinacular layer also was closed using a running #0 Vicryl suture. A total of 10 cc of transexemic acid (TXA) was injected intra-articularly before the subcutaneous tissues were closed in several layers using 2-0 Vicryl interrupted sutures. The skin was closed using staples. A sterile honeycomb dressing was applied to the skin before the leg was wrapped with an Ace wrap to accommodate the Polar Care device. The patient was then awakened and returned to the recovery room in satisfactory condition after tolerating the procedure well. ?

## 2021-12-28 NOTE — Transfer of Care (Signed)
Immediate Anesthesia Transfer of Care Note ? ?Patient: Mc Micalizzi ? ?Procedure(s) Performed: TOTAL KNEE ARTHROPLASTY (Right: Knee) ? ?Patient Location: PACU ? ?Anesthesia Type:General ? ?Level of Consciousness: awake, drowsy and patient cooperative ? ?Airway & Oxygen Therapy: Patient Spontanous Breathing and Patient connected to face mask oxygen ? ?Post-op Assessment: Report given to RN and Post -op Vital signs reviewed and stable ? ?Post vital signs: Reviewed and stable ? ?Last Vitals:  ?Vitals Value Taken Time  ?BP 106/67 12/28/21 1249  ?Temp    ?Pulse 75 12/28/21 1251  ?Resp 20 12/28/21 1251  ?SpO2 100 % 12/28/21 1251  ?Vitals shown include unvalidated device data. ? ?Last Pain:  ?Vitals:  ? 12/28/21 0907  ?TempSrc: Temporal  ?PainSc: 0-No pain  ?   ? ?Patients Stated Pain Goal: 0 (12/28/21 7939) ? ?Complications: No notable events documented. ?

## 2021-12-28 NOTE — Anesthesia Procedure Notes (Signed)
Procedure Name: General with mask airway ?Date/Time: 12/28/2021 10:55 AM ?Performed by: Kelton Pillar, CRNA ?Pre-anesthesia Checklist: Patient identified, Emergency Drugs available, Suction available and Patient being monitored ?Patient Re-evaluated:Patient Re-evaluated prior to induction ?Oxygen Delivery Method: Simple face mask ?Induction Type: IV induction ?Placement Confirmation: positive ETCO2, CO2 detector and breath sounds checked- equal and bilateral ?Dental Injury: Teeth and Oropharynx as per pre-operative assessment  ? ? ? ? ?

## 2021-12-28 NOTE — Evaluation (Addendum)
Physical Therapy Evaluation ?Patient Details ?Name: Donna Ferguson ?MRN: [id number] ?DOB: [birth year] ?Today's Date: 12/28/2021 ? ?History of Present Illness ? Pt is a 81 yo s/p right TKA POD 0. PMH includes h/o left TKA and HTN.  ?Clinical Impression ? Pt presents with son and husband present supine in bed, alert and oriented, and agreeable to PT. Pt exhibits decreased RLE ROM and strength and decreased mobility requiring supervision with use of FWW to complete transfers and to ambulate. Pt does show consistent full weight bearing in RLE as evidenced by reoccurring heel contact and decreased use of BUE for support while ambulating 50 ft. Pt also exhibits excellent quad activation with completion of quad sets and consistent increased stability of RLE as stance leg throughout ambulation. PT recommends that pt is safe to d/c home with additional HHPT to return to prior level of function and to regain independence. ?   ? ?Recommendations for follow up therapy are one component of a multi-disciplinary discharge planning process, led by the attending physician.  Recommendations may be updated based on patient status, additional functional criteria and insurance authorization. ? ?Follow Up Recommendations Follow physician's recommendations for discharge plan and follow up therapies ? ?  ?Assistance Recommended at Discharge Intermittent Supervision/Assistance  ?Patient can return home with the following ? Assist for transportation;Help with stairs or ramp for entrance;A little help with bathing/dressing/bathroom;A little help with walking and/or transfers ? ?  ?Equipment Recommendations Rolling walker (2 wheels)  ?Recommendations for Other Services ?    ?  ?Functional Status Assessment Patient has had a recent decline in their functional status and demonstrates the ability to make significant improvements in function in a reasonable and predictable amount of time.  ? ?  ?Precautions / Restrictions Precautions ?Precautions:  Knee ?Precaution Booklet Issued: Yes (comment) ?Precaution Comments: Pt educated on precautions and need to elevate right foot to promote right knee extension ?Restrictions ?Weight Bearing Restrictions: Yes ?RLE Weight Bearing: Weight bearing as tolerated  ? ?  ? ?Mobility ? Bed Mobility ?Overal bed mobility: Modified Independent ?  ?  ?  ?  ?  ?  ?General bed mobility comments: Extra time to reach edge of bed ?  ? ?Transfers ?Overall transfer level: Modified independent ?Equipment used: Rolling walker (2 wheels) ?  ?  ?  ?  ?  ?  ?  ?  ?  ? ?Ambulation/Gait ?Ambulation/Gait assistance: Modified independent (Device/Increase time), Min guard ?Gait Distance (Feet): 50 Feet ?Assistive device: Rolling walker (2 wheels) ?Gait Pattern/deviations: Step-to pattern, Decreased step length - left, Decreased stance time - right, Antalgic ?Gait velocity: Decreased ?Gait velocity interpretation: <1.31 ft/sec, indicative of household ambulator ?  ?General Gait Details: Min VC to increase step length on left. Otherwise consistent weight bearing as evidenced by right heel contact during bout of ambulation. ? ?Stairs ?  ?  ?  ?  ?  ? ?Wheelchair Mobility ?  ? ?Modified Rankin (Stroke Patients Only) ?  ? ?  ? ?Balance Overall balance assessment: Modified Independent ?  ?  ?  ?  ?  ?  ?  ?  ?  ?  ?  ?  ?  ?  ?  ?  ?  ?  ?   ? ? ? ?Pertinent Vitals/Pain Pain Assessment ?Pain Assessment: No/denies pain  ? ? ?Home Living Family/patient expects to be discharged to:: Private residence ?Living Arrangements: Spouse/significant other;Children ?Available Help at Discharge: Family ?Type of Home: House ?Home Access: Level entry ?  ?  ?  Alternate Level Stairs-Number of Steps: 13 ?Home Layout: Multi-level ?Home Equipment: None ?Additional Comments: Needs RW  ?  ?Prior Function Prior Level of Function : Independent/Modified Independent ?  ?  ?  ?  ?  ?  ?  ?  ?  ? ? ?Hand Dominance  ?   ? ?  ?Extremity/Trunk Assessment  ? Upper Extremity  Assessment ?Upper Extremity Assessment: Overall WFL for tasks assessed ?  ? ?Lower Extremity Assessment ?Lower Extremity Assessment: Overall WFL for tasks assessed ?  ? ?Cervical / Trunk Assessment ?Cervical / Trunk Assessment: Normal  ?Communication  ? Communication: No difficulties  ?Cognition Arousal/Alertness: Awake/alert ?Behavior During Therapy: [facility] for tasks assessed/performed ?Overall Cognitive Status: Within Functional Limits for tasks assessed ?  ?  ?  ?  ?  ?  ?  ?  ?  ?  ?  ?  ?  ?  ?  ?  ?  ?  ?  ? ?  ?General Comments General comments (skin integrity, edema, etc.): Able to bear weight well through RLE with little weight placed through BUE ? ?  ?Exercises Total Joint Exercises ?Goniometric ROM: Knee AROM/PROM Flexion: 65 deg/75 deg Knee AROM/PROM Ext: 4 deg /6 deg ?Other Exercises ?Other Exercises: Quad Sets 1 x 10 ?Other Exercises: Heel Slides 1 x 10 ?Other Exercises: Ankle Pumps 1 x 10 ?Other Exercises: AAROM Straight Leg Raises x 2  ? ?Assessment/Plan  ?  ?PT Assessment Patient needs continued PT services  ?PT Problem List Decreased mobility;Decreased range of motion;Decreased balance;Pain ? ?   ?  ?PT Treatment Interventions DME instruction;Gait training;Stair training;Therapeutic exercise;Therapeutic activities;Balance training;Patient/family education;Neuromuscular re-education   ? ?PT Goals (Current goals can be found in the Care Plan section)  ?Acute Rehab PT Goals ?Patient Stated Goal: to go home ?PT Goal Formulation: With patient/family ?Time For Goal Achievement: 01/11/22 ?Potential to Achieve Goals: Good ? ?  ?Frequency BID ?  ? ? ?Co-evaluation   ?  ?  ?  ?  ? ? ?  ?AM-PAC PT "6 Clicks" Mobility  ?Outcome Measure Help needed turning from your back to your side while in a flat bed without using bedrails?: None ?Help needed moving from lying on your back to sitting on the side of a flat bed without using bedrails?: None ?Help needed moving to and from a bed to a chair (including a  wheelchair)?: A Little ?Help needed standing up from a chair using your arms (e.g., wheelchair or bedside chair)?: A Little ?Help needed to walk in hospital room?: A Little ?Help needed climbing 3-5 steps with a railing? : A Lot ?6 Click Score: 19 ? ?  ?End of Session Equipment Utilized During Treatment: Gait belt ?Activity Tolerance: Patient tolerated treatment well;No increased pain ?Patient left: in chair;with chair alarm set;Other (comment);with call bell/phone within reach;with family/visitor present (Right heel floated to promote knee extension) ?Nurse Communication: Mobility status ?PT Visit Diagnosis: Unsteadiness on feet (R26.81);Muscle weakness (generalized) (M62.81);Other abnormalities of gait and mobility (R26.89);Difficulty in walking, not elsewhere classified (R26.2) ?  ? ?Time: 1627-1700 ?PT Time Calculation (min) (ACUTE ONLY): 33 min ? ? ?Charges:   PT Evaluation ?$PT Eval Low Complexity: 1 Low ?PT Treatments ?$Gait Training: 8-22 mins ?  ?   ? ? ? ?Daneil Dan PT, DPT ?12/28/2021, 6:23 PM ? ?

## 2021-12-28 NOTE — TOC Progression Note (Signed)
Transition of Care (TOC) - Progression Note  ? ? ?Patient Details  ?Name: Donna Ferguson ?MRN: [id number] ?Date of Birth: May 22, 1941 ? ?Transition of Care (TOC) CM/SW Contact  ?Conception Oms, RN ?Phone Number: ?12/28/2021, 2:24 PM ? ?Clinical Narrative:    ? ?The patient is set up with [place] for [facility] services ?By the surgeons office prior to surgery ? ?  ?  ? ?Expected Discharge Plan and Services ?  ?  ?  ?  ?  ?                ?  ?  ?  ?  ?  ?  ?  ?  ?  ?  ? ? ?Social Determinants of Health (SDOH) Interventions ?  ? ?Readmission Risk Interventions ?   ? View : No data to display.  ?  ?  ?  ? ? ?

## 2021-12-29 ENCOUNTER — Encounter: Payer: Self-pay | Admitting: Surgery

## 2021-12-29 DIAGNOSIS — M1711 Unilateral primary osteoarthritis, right knee: Secondary | ICD-10-CM | POA: Diagnosis not present

## 2021-12-29 LAB — BASIC METABOLIC PANEL
Anion gap: 8 (ref 5–15)
BUN: 22 mg/dL (ref 8–23)
CO2: 26 mmol/L (ref 22–32)
Calcium: 9.3 mg/dL (ref 8.9–10.3)
Chloride: 103 mmol/L (ref 98–111)
Creatinine, Ser: 1.21 mg/dL — ABNORMAL HIGH (ref 0.44–1.00)
GFR, Estimated: 46 mL/min — ABNORMAL LOW (ref >60)
Glucose, Bld: 142 mg/dL — ABNORMAL HIGH (ref 70–99)
Potassium: 4.1 mmol/L (ref 3.5–5.1)
Sodium: 137 mmol/L (ref 135–145)

## 2021-12-29 LAB — CBC
HCT: 37.4 % (ref 36.0–46.0)
Hemoglobin: 12.2 g/dL (ref 12.0–15.0)
MCH: 29.1 pg (ref 26.0–34.0)
MCHC: 32.6 g/dL (ref 30.0–36.0)
MCV: 89.3 fL (ref 80.0–100.0)
Platelets: 246 10*3/uL (ref 150–400)
RBC: 4.19 MIL/uL (ref 3.87–5.11)
RDW: 13.5 % (ref 11.5–15.5)
WBC: 12.5 10*3/uL — ABNORMAL HIGH (ref 4.0–10.5)
nRBC: 0 % (ref 0.0–0.2)

## 2021-12-29 MED ORDER — ONDANSETRON HCL 4 MG PO TABS: 4.0000 mg | ORAL_TABLET | Freq: Four times a day (QID) | ORAL | 0 refills | Status: DC | PRN

## 2021-12-29 MED ORDER — OXYCODONE HCL 5 MG PO TABS: 5.0000 mg | ORAL_TABLET | ORAL | 0 refills | Status: DC | PRN

## 2021-12-29 MED ORDER — ACETAMINOPHEN 325 MG PO TABS: 325.0000 mg | ORAL_TABLET | Freq: Four times a day (QID) | ORAL | 0 refills | Status: DC | PRN

## 2021-12-29 MED ORDER — APIXABAN 2.5 MG PO TABS: 2.5000 mg | ORAL_TABLET | Freq: Two times a day (BID) | ORAL | 0 refills | Status: DC

## 2021-12-29 NOTE — Discharge Summary (Signed)
?Physician Discharge Summary  ?Patient ID: ?Donna Ferguson ?MRN: [id number] ?DOB/AGE: [birth year] 81 y.o. ? ?Admit date: 12/28/2021 ?Discharge date: 12/29/2021 ? ?Admission Diagnoses:  ?Status post total knee replacement using cement, right [Z96.651] ?Degenerative joint disease of the right knee. ? ?Discharge Diagnoses: ?Patient Active Problem List  ? Diagnosis Date Noted  ? Status post total knee replacement using cement, 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  ? Lymphadenopathy 12/05/2021  ? Chronic kidney disease, stage 3a ([place]) 11/29/2021  ? Irritable 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 diarrhea 11/29/2021  ? 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  ? LLQ pain 11/17/2021  ? Urinary frequency 12/07/2020  ? Dysuria 10/10/2020  ? Peripheral neuropathy 08/05/2020  ? Hand pain 08/05/2020  ? Skin lesion 01/13/2020  ? Constipation 01/01/2020  ? Renal cyst 01/01/2020  ? Liver cyst 01/01/2020  ? Low back pain 01/01/2020  ? Abdominal pain 12/08/2019  ? HTN (hypertension) 10/28/2019  ? Prediabetes 10/28/2019  ? Right knee pain 10/28/2019  ? 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  ? 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  ? Nuclear sclerotic cataract of both eyes 10/15/2019  ? Bilateral knee pain 05/29/2019  ? ? ?Past Medical History:  ?Diagnosis Date  ? Arthritis   ? Chronic kidney disease, stage 3a ([place])   ? cyst near kidney  ? Constipation   ? Heart murmur   ? past  ? History of colonoscopy   ? 2019 in [place]; patient states no longer screening for colon cancer.  ? Hypertension   ? IBS (irritable bowel syndrome)   ? Mitral valve insufficiency   ? Pneumonia   ? PONV (postoperative nausea and vomiting)   ? Pre-diabetes   ? Renal cyst   ? Right leg DVT (HCC)   ? ?  ?Transfusion: None. ?  ?Consultants (if any):  ? ?Discharged Condition: Improved ? ?Hospital Course: Donna Ferguson is an 81 y.o. female who was admitted 12/28/2021 with a diagnosis of right knee degenerative joint disease and went to the operating room on 12/28/2021 and underwent the above named  procedures.  ?  ?Surgeries: Procedure(s): ?TOTAL KNEE ARTHROPLASTY on 12/28/2021 ?Patient tolerated the surgery well. Taken to PACU where she was stabilized and then transferred to the orthopedic floor. ? ?Started on Eliquis 2.'5mg'$  twice daily. Heels elevated on bed with rolled towels. No evidence of DVT. Negative Homan. ?Physical therapy started on day #1 for gait training and transfer. OT started day #1 for ADL and assisted devices. ? ?Patient's IV was removed on POD1. ? ?Implants: Right TKA using all-cemented [employer] Vanguard system with a 67.5 mm mm PCR femur, a 71 mm tibial tray with a 10 mm anterior stabilized E-poly insert, and a 34 x 7.8 mm all-poly 3-pegged domed patella. ? ?She was given perioperative antibiotics:  ?Anti-infectives (From admission, onward)  ? ? Start     Dose/Rate Route Frequency Ordered Stop  ? 12/28/21 1630  ceFAZolin (ANCEF) IVPB 2g/100 mL premix       ? 2 g ?200 mL/hr over 30 Minutes Intravenous Every 6 hours 12/28/21 1425 12/29/21 0615  ? 12/28/21 0600  ceFAZolin (ANCEF) IVPB 2g/100 mL premix       ? 2 g ?200 mL/hr over 30 Minutes Intravenous On call to O.R. 12/27/21 2246 12/28/21 1039  ? ?  ?. ? ?She was given sequential compression devices, early ambulation, and Eliquis for DVT prophylaxis. ? ?She benefited maximally from the hospital stay and there were no complications.   ? ?Recent vital signs:  ?Vitals:  ? 12/29/21 0419 12/29/21 0721  ?BP: 128/70 (!) 127/59  ?  Pulse: (!) 59 65  ?Resp: 15 15  ?Temp: 98.2 ?F (36.8 ?C) 97.6 ?F (36.4 ?C)  ?SpO2: 100% 99%  ? ? ?Recent laboratory studies:  ?Lab Results  ?Component Value Date  ? HGB 12.2 12/29/2021  ? HGB 13.3 12/05/2021  ? HGB 13.6 09/18/2021  ? ?Lab Results  ?Component Value Date  ? WBC 12.5 (H) 12/29/2021  ? PLT 246 12/29/2021  ? ?No results found for: INR ?Lab Results  ?Component Value Date  ? NA 137 12/29/2021  ? K 4.1 12/29/2021  ? CL 103 12/29/2021  ? CO2 26 12/29/2021  ? BUN 22 12/29/2021  ? CREATININE 1.21 (H) 12/29/2021  ? GLUCOSE  142 (H) 12/29/2021  ? ? ?Discharge Medications:   ?Allergies as of 12/29/2021   ?No Known Allergies ?  ? ?  ?Medication List  ?  ? ?TAKE these medications   ? ?acetaminophen 325 MG tablet ?Commonly known as: TYLENOL ?Take 1-2 tablets (325-650 mg total) by mouth every 6 (six) hours as needed for mild pain (pain score 1-3 or temp > 100.5). ?  ?amLODipine 5 MG tablet ?Commonly known as: NORVASC ?TAKE 1 TABLET(5 MG) BY MOUTH EVERY EVENING ?  ?apixaban 2.5 MG Tabs tablet ?Commonly known as: ELIQUIS ?Take 1 tablet (2.5 mg total) by mouth 2 (two) times daily. ?  ?cholecalciferol 25 MCG (1000 UNIT) tablet ?Commonly known as: VITAMIN D3 ?Take 1,000 Units by mouth daily. ?  ?losartan 25 MG tablet ?Commonly known as: COZAAR ?TAKE 1 TABLET(25 MG) BY MOUTH DAILY ?  ?metoprolol succinate 50 MG 24 hr tablet ?Commonly known as: TOPROL-XL ?TAKE 1 TABLET(50 MG) BY MOUTH DAILY WITH OR IMMEDIATELY FOLLOWING A MEAL ?  ?ondansetron 4 MG tablet ?Commonly known as: ZOFRAN ?Take 1 tablet (4 mg total) by mouth every 6 (six) hours as needed for nausea. ?  ?oxyCODONE 5 MG immediate release tablet ?Commonly known as: Oxy IR/ROXICODONE ?Take 1-2 tablets (5-10 mg total) by mouth every 4 (four) hours as needed for moderate pain (pain score 4-6). ?  ?polyethylene glycol powder 17 GM/SCOOP powder ?Commonly known as: GLYCOLAX/MIRALAX ?Take 1 Container by mouth daily as needed. ?  ?Trospium Chloride 60 MG Cp24 ?Take 1 capsule (60 mg total) by mouth every morning. ?  ?vitamin C 500 MG tablet ?Commonly known as: ASCORBIC ACID ?Take 500 mg by mouth daily. ?  ? ?  ? ?  ?  ? ? ?  ?Durable Medical Equipment  ?(From admission, onward)  ?  ? ? ?  ? ?  Start     Ordered  ? 12/28/21 1426  DME Bedside commode  Once       ?Question:  Patient needs a bedside commode to treat with the following condition  Answer:  Status post total knee replacement using cement, right  ? 12/28/21 1425  ? 12/28/21 1426  DME 3 n 1  Once       ? 12/28/21 1425  ? 12/28/21 1426  DME  Walker rolling  Once       ?Question Answer Comment  ?Walker: With 5 Inch Wheels   ?Patient needs a walker to treat with the following condition Status post total knee replacement using cement, right   ?  ? 12/28/21 1425  ? ?  ?  ? ?  ? ? ?Diagnostic Studies: DG Knee Right Port ? ?Result Date: 12/28/2021 ?CLINICAL DATA:  Status post right knee arthroplasty EXAM: PORTABLE RIGHT KNEE - 1-2 VIEW COMPARISON:  11/17/2021 FINDINGS: There is interval right  knee arthroplasty. No fracture or dislocation is seen. There are pockets of air in the soft tissues and skin staples in the anterior aspect. IMPRESSION: Status post right knee arthroplasty. Electronically Signed   By: Elmer Picker M.D.   On: 12/28/2021 13:29  ? ?MM 3D SCREEN BREAST BILATERAL ? ?Result Date: 12/28/2021 ?CLINICAL DATA:  Screening. History of bilateral excisional biopsies. EXAM: DIGITAL SCREENING BILATERAL MAMMOGRAM WITH TOMOSYNTHESIS AND CAD TECHNIQUE: Bilateral screening digital craniocaudal and mediolateral oblique mammograms were obtained. Bilateral screening digital breast tomosynthesis was performed. The images were evaluated with computer-aided detection. Best images possible per technologist communication. / COMPARISON:  Previous exam(s). ACR Breast Density Category c: The breast tissue is heterogeneously dense, which may obscure small masses. FINDINGS: There are no findings suspicious for malignancy. Postsurgical changes are noted bilaterally. IMPRESSION: No mammographic evidence of malignancy. A result letter of this screening mammogram will be mailed directly to the patient. RECOMMENDATION: Screening mammogram in one year. (Code:SM-B-01Y) BI-RADS CATEGORY  2: Benign. Electronically Signed   By: Valentino Saxon M.D.   On: 12/28/2021 09:20   ? ?Disposition: Plan for discharge home today pending progress with therapy. ? ? ? ? Follow-up Information   ? ? Lattie Corns, PA-C Follow up in 14 day(s).   ?Specialty: Physician Assistant ?Why:  [employer]. ?Contact information: ?[address] ?[place] [address] ?[phone] ? ? ?  ?  ? ?  ?  ? ?  ? ? ? ?Signed: ?Judson Roch PA-C ?12/29/2021, 8:14 AM  ?

## 2021-12-29 NOTE — Progress Notes (Signed)
Physical Therapy Treatment ?Patient Details ?Name: Donna Ferguson ?MRN: [id number] ?DOB: Nov 19, 1940 ?Today's Date: 12/29/2021 ? ? ?History of Present Illness Pt is a 81 yo s/p right TKA POD 0. PMH includes h/o left TKA and HTN. ? ?  ?PT Comments  ? ? Pt was sitting in recliner upon arriving. She is A and O x 4 and agreeable to session. She require no physical assistance to stand or ambulate. Demonstrated safe abilities to ascend/descend stairs without concerns. Pt demonstrates 96 degrees AROM. Recommend continued skilled PT post admission to progress ROM and strength to PLOF. Pt is cleared form an acute standpoint for safe DC home today. ?  ?Recommendations for follow up therapy are one component of a multi-disciplinary discharge planning process, led by the attending physician.  Recommendations may be updated based on patient status, additional functional criteria and insurance authorization. ? ?Follow Up Recommendations ? Follow physician's recommendations for discharge plan and follow up therapies ?  ?  ?Assistance Recommended at Discharge PRN  ?Patient can return home with the following Assist for transportation;Help with stairs or ramp for entrance;A little help with bathing/dressing/bathroom;A little help with walking and/or transfers ?  ?Equipment Recommendations ? Rolling walker (2 wheels)  ?  ?   ?Precautions / Restrictions Precautions ?Precautions: Knee ?Precaution Booklet Issued: Yes (comment) ?Restrictions ?Weight Bearing Restrictions: Yes ?RLE Weight Bearing: Weight bearing as tolerated  ?  ? ?Mobility ? Bed Mobility ?Overal bed mobility: Modified Independent ?  ?Transfers ?Overall transfer level: Modified independent ?  ?Ambulation/Gait ?Ambulation/Gait assistance: Modified independent (Device/Increase time), Min guard ?  ?Balance Overall balance assessment: Modified Independent ?  ?  ?  ?  ?Cognition Arousal/Alertness: Awake/alert ?Behavior During Therapy: [facility] for tasks assessed/performed ?Overall  Cognitive Status: Within Functional Limits for tasks assessed ?  ? General Comments: Pt is A and O x 4 ?  ?  ? ?  ?   ?   ? ?Pertinent Vitals/Pain Pain Assessment ?Pain Assessment: No/denies pain  ? ? ? ?PT Goals (current goals can now be found in the care plan section) Acute Rehab PT Goals ?Patient Stated Goal: to go home ?Progress towards PT goals: Progressing toward goals ? ?  ?Frequency ? ? ? BID ? ? ? ?  ?PT Plan Current plan remains appropriate  ? ? ?   ?AM-PAC PT "6 Clicks" Mobility   ?Outcome Measure ? Help needed turning from your back to your side while in a flat bed without using bedrails?: None ?Help needed moving from lying on your back to sitting on the side of a flat bed without using bedrails?: None ?Help needed moving to and from a bed to a chair (including a wheelchair)?: A Little ?Help needed standing up from a chair using your arms (e.g., wheelchair or bedside chair)?: A Little ?Help needed to walk in hospital room?: A Little ?Help needed climbing 3-5 steps with a railing? : A Little ?6 Click Score: 20 ? ?  ?End of Session Equipment Utilized During Treatment: Gait belt ?Activity Tolerance: Patient tolerated treatment well;No increased pain ?Patient left: in chair;with chair alarm set;Other (comment);with call bell/phone within reach;with family/visitor present ?Nurse Communication: Mobility status ?PT Visit Diagnosis: Unsteadiness on feet (R26.81);Muscle weakness (generalized) (M62.81);Other abnormalities of gait and mobility (R26.89);Difficulty in walking, not elsewhere classified (R26.2) ?  ? ? ?Time: 4627-0350 ?PT Time Calculation (min) (ACUTE ONLY): 14 min ? ?Charges:  $Gait Training: 8-22 mins          ?          ? ?  Julaine Fusi PTA ?12/29/21, 9:45 AM  ? ?

## 2021-12-29 NOTE — Progress Notes (Signed)
Pt given discharge instructions. Waiting on daughter and Gilford Rile to be delivered. ?

## 2021-12-29 NOTE — Progress Notes (Addendum)
?  Subjective: ?1 Day Post-Op Procedure(s) (LRB): ?TOTAL KNEE ARTHROPLASTY (Right) ?Patient reports pain as mild.   ?Patient is well, and has had no acute complaints or problems ?Plan is to go Home after hospital stay. ?Negative for chest pain and shortness of breath ?Fever: no ?Gastrointestinal:Negative for nausea and vomiting ?Reports she is passing some gas. ? ?Objective: ?Vital signs in last 24 hours: ?Temp:  [97 ?F (36.1 ?C)-98.2 ?F (36.8 ?C)] 97.6 ?F (36.4 ?C) (05/12 4562) ?Pulse Rate:  [58-86] 65 (05/12 0721) ?Resp:  [15-22] 15 (05/12 0721) ?BP: (106-142)/(59-80) 127/59 (05/12 0721) ?SpO2:  [96 %-100 %] 99 % (05/12 0721) ?Weight:  [73.5 kg] 73.5 kg (05/11 0907) ? ?Intake/Output from previous day: ? ?Intake/Output Summary (Last 24 hours) at 12/29/2021 0736 ?Last data filed at 12/29/2021 0400 ?Gross per 24 hour  ?Intake 1551.29 ml  ?Output 585 ml  ?Net 966.29 ml  ?  ?Intake/Output this shift: ?No intake/output data recorded. ? ?Labs: ?Recent Labs  ?  12/29/21 ?0530  ?HGB 12.2  ? ?Recent Labs  ?  12/29/21 ?0530  ?WBC 12.5*  ?RBC 4.19  ?HCT 37.4  ?PLT 246  ? ?Recent Labs  ?  12/29/21 ?0530  ?NA 137  ?K 4.1  ?CL 103  ?CO2 26  ?BUN 22  ?CREATININE 1.21*  ?GLUCOSE 142*  ?CALCIUM 9.3  ? ?No results for input(s): LABPT, INR in the last 72 hours. ? ? ?EXAM ?General - Patient is Alert, Appropriate, and Oriented ?Extremity - ABD soft ?Neurovascular intact ?Dorsiflexion/Plantar flexion intact ?Incision: dressing C/D/I ?No cellulitis present ?Dressing/Incision - clean, dry, no drainage, honeycomb intact to the right knee. ?Motor Function - intact, moving foot and toes well on exam.  ?Abdomen soft with intact bowel sounds. ?Negative homans test to b/l lower extremities. ? ?Past Medical History:  ?Diagnosis Date  ? Arthritis   ? Chronic kidney disease, stage 3a ([place])   ? cyst near kidney  ? Constipation   ? Heart murmur   ? past  ? History of colonoscopy   ? 2019 in [place]; patient states no longer screening for colon cancer.  ?  Hypertension   ? IBS (irritable bowel syndrome)   ? Mitral valve insufficiency   ? Pneumonia   ? PONV (postoperative nausea and vomiting)   ? Pre-diabetes   ? Renal cyst   ? Right leg DVT (HCC)   ? ? ?Assessment/Plan: ?1 Day Post-Op Procedure(s) (LRB): ?TOTAL KNEE ARTHROPLASTY (Right) ?Principal Problem: ?  Status post total knee replacement using cement, right ? ?Estimated body mass index is 28.7 kg/m? as calculated from the following: ?  Height as of this encounter: '5\' 3"'$  (1.6 m). ?  Weight as of this encounter: 73.5 kg. ?Advance diet ?Up with therapy ?D/C IV fluids when tolerating po intake. ? ?Labs reviewed, WBC 12.5.  No fevers, chills, SOB or chest pain. ?Up with therapy today.  Patient is set up with HHPT. ?Passing gas without pain. ?Plan for possible d/c home today pending progress with therapy. ? ?DVT Prophylaxis - Foot Pumps and SCDs and Eliquis ?Weight-Bearing as tolerated to right leg ? ?Raquel Sandrika Schwinn, PA-C ?[facility] Orthopaedic Surgery ?12/29/2021, 7:36 AM  ?

## 2021-12-29 NOTE — TOC Transition Note (Addendum)
Transition of Care (TOC) - CM/SW Discharge Note ? ? ?Patient Details  ?Name: Donna Ferguson ?MRN: [id number] ?Date of Birth: [birth year] ? ?Transition of Care (TOC) CM/SW Contact:  ?Lenix Benoist E Telesforo Brosnahan, LCSW ?Phone Number: ?12/29/2021, 9:07 AM ? ? ?Clinical Narrative:   Patient to DC home today.  ?Spoke to patient who says she has a shower chair. Declines BSC. Agreeable to RW. ?RW referral made to [facility] with [employer] to be delivered to bedside prior to DC. ?Notified [place] with [facility] of DC. ? ?12:00- Asked Zach with [employer] for update on RW delivery.  ?Called other [employer] number, spoke to [place]. She stated it would be delivered shortly.  ? ?12:18- Shelia with [employer] said RW is on the way.  ? ? ? ?Final next level of care: [place] ?Barriers to Discharge: Barriers Resolved ? ? ?Patient Goals and CMS Choice ?Patient states their goals for this hospitalization and ongoing recovery are:: home with home health ?CMS Medicare.gov Compare Post Acute Care list provided to:: Patient ?Choice offered to / list presented to : Patient ? ?Discharge Placement ?  ?           ?  ?  ?  ?Patient and family notified of of transfer: 12/29/21 ? ?Discharge Plan and Services ?  ?  ?           ?DME Arranged: Walker rolling ?DME Agency: [employer] ?Date DME Agency Contacted: 12/29/21 ?  ?Representative spoke with at DME Agency: Suanne Marker ?HH Arranged: PT ?[place] Agency: [place] ?Date HH Agency Contacted: 12/29/21 ?  ?Representative spoke with at [place]: [place] ? ?Social Determinants of Health (SDOH) Interventions ?  ? ? ?Readmission Risk Interventions ?   ? View : No data to display.  ?  ?  ?  ? ? ? ? ? ?

## 2021-12-29 NOTE — Anesthesia Postprocedure Evaluation (Signed)
Anesthesia Post Note ? ?Patient: Donna Ferguson ? ?Procedure(s) Performed: TOTAL KNEE ARTHROPLASTY (Right: Knee) ? ?Patient location during evaluation: Nursing Unit ?Anesthesia Type: Spinal ?Level of consciousness: oriented and awake and alert ?Pain management: pain level controlled ?Vital Signs Assessment: post-procedure vital signs reviewed and stable ?Respiratory status: spontaneous breathing and respiratory function stable ?Cardiovascular status: blood pressure returned to baseline and stable ?Postop Assessment: no headache, no backache, no apparent nausea or vomiting and patient able to bend at knees ?Anesthetic complications: no ? ? ?No notable events documented. ? ? ?Last Vitals:  ?Vitals:  ? 12/29/21 0419 12/29/21 0721  ?BP: 128/70 (!) 127/59  ?Pulse: (!) 59 65  ?Resp: 15 15  ?Temp: 36.8 ?C 36.4 ?C  ?SpO2: 100% 99%  ?  ?Last Pain:  ?Vitals:  ? 12/29/21 0625  ?TempSrc:   ?PainSc: 3   ? ? ?  ?  ?  ?  ?  ?  ? ?Lia Foyer ? ? ? ? ?

## 2021-12-29 NOTE — Discharge Instructions (Signed)

## 2022-01-11 NOTE — Telephone Encounter (Signed)
Pt sched with you for tomorrow at 1:20. Thanks !

## 2022-01-11 NOTE — Telephone Encounter (Signed)
Pt of Donna Ferguson S/w pt - urgency, frequency, strong foul odor, and very cloudy urine started yesterday around the afternoon time. Worsened over last night, stated was getting up every 20-30 minutes to use restroom and only dribbling. Pt states no fever, chills, shortness of breath, etc.   Pt just had knee replacement, very hard for her to move around or leave the house right now. No one able to bring pt to office today. Pt does not know how to do virtual visit.  Can I place her on your schedule somewhere for telephone call visit so you can assess her ?  She is going to see Poggi tomorrow for surgical follow up.

## 2022-01-12 ENCOUNTER — Ambulatory Visit: Payer: Medicare Other | Admitting: Internal Medicine

## 2022-02-06 ENCOUNTER — Telehealth: Payer: Self-pay | Admitting: Family

## 2022-02-06 NOTE — Telephone Encounter (Signed)
Pt called stating she is having problems with constipation after her knee surgery. Pt would like for some medication to be called in

## 2022-02-07 NOTE — Telephone Encounter (Signed)
I called patient & she stated that she did finally have a BM yesterday. I did recommend that it is safe her patient to take Miralax daily or as needed. She could also do docusate stool softener as well. I asked that she please call back if issues persist. Pt verbalized understanding.

## 2022-02-14 ENCOUNTER — Other Ambulatory Visit: Payer: Self-pay | Admitting: Family

## 2022-02-14 DIAGNOSIS — R350 Frequency of micturition: Secondary | ICD-10-CM

## 2022-02-19 ENCOUNTER — Other Ambulatory Visit: Payer: Self-pay

## 2022-02-19 DIAGNOSIS — N3281 Overactive bladder: Secondary | ICD-10-CM

## 2022-02-19 MED ORDER — TROSPIUM CHLORIDE ER 60 MG PO CP24: 60.0000 mg | ORAL_CAPSULE | Freq: Every morning | ORAL | 1 refills | Status: DC

## 2022-02-27 NOTE — Telephone Encounter (Signed)
LVM to call back to office to discuss medication. (trospium)

## 2022-03-07 NOTE — Telephone Encounter (Signed)
Spoke to patient in regards to her medication Trospium and she stated that she did not have UTI therefore she did not need the medication

## 2022-03-30 ENCOUNTER — Telehealth: Payer: Self-pay | Admitting: Family

## 2022-03-30 NOTE — Telephone Encounter (Signed)
Pt called in stating that she had a bad case of gastro problem... Pt stated that she throw up 2 days in a row... Pt stated that she is having chest pain from the gastro problem... Pt would like to know if NP Arnett can recommend something for her to take to help her gastro problem... Pt requesting callback.Marland KitchenMarland Kitchen

## 2022-03-30 NOTE — Telephone Encounter (Signed)
noted 

## 2022-03-30 NOTE — Telephone Encounter (Signed)
Called and spoke to patient and she  stated that she was not having chest pains as of right now, and the gas had subsided because she took some pepto bismol and it helped. I encouraged her to go to ED if the chest pain returns. Patient stated that she would. I scheduled her for an office visit on 8/11 with [place]

## 2022-03-30 NOTE — Telephone Encounter (Signed)
Call pt  Triage  CP , she needs to go to ED right now. She doenst need to wait over the weekend.  You can sch her to see me next week

## 2022-04-02 ENCOUNTER — Ambulatory Visit (INDEPENDENT_AMBULATORY_CARE_PROVIDER_SITE_OTHER): Payer: Medicare Other | Admitting: Family

## 2022-04-02 ENCOUNTER — Encounter: Payer: Self-pay | Admitting: Family

## 2022-04-02 VITALS — BP 124/80 | HR 83 | Temp 98.3°F | Ht 63.0 in | Wt 152.6 lb

## 2022-04-02 DIAGNOSIS — R1013 Epigastric pain: Secondary | ICD-10-CM | POA: Insufficient documentation

## 2022-04-02 LAB — COMPREHENSIVE METABOLIC PANEL
ALT: 8 U/L (ref 0–35)
AST: 14 U/L (ref 0–37)
Albumin: 3.9 g/dL (ref 3.5–5.2)
Alkaline Phosphatase: 88 U/L (ref 39–117)
BUN: 17 mg/dL (ref 6–23)
CO2: 25 mEq/L (ref 19–32)
Calcium: 9.7 mg/dL (ref 8.4–10.5)
Chloride: 102 mEq/L (ref 96–112)
Creatinine, Ser: 0.95 mg/dL (ref 0.40–1.20)
GFR: 56.96 mL/min — ABNORMAL LOW (ref >60.00)
Glucose, Bld: 117 mg/dL — ABNORMAL HIGH (ref 70–99)
Potassium: 3.6 mEq/L (ref 3.5–5.1)
Sodium: 140 mEq/L (ref 135–145)
Total Bilirubin: 0.4 mg/dL (ref 0.2–1.2)
Total Protein: 6.9 g/dL (ref 6.0–8.3)

## 2022-04-02 LAB — CBC WITH DIFFERENTIAL/PLATELET
Basophils Absolute: 0 10*3/uL (ref 0.0–0.1)
Basophils Relative: 0.4 % (ref 0.0–3.0)
Eosinophils Absolute: 0 10*3/uL (ref 0.0–0.7)
Eosinophils Relative: 0.3 % (ref 0.0–5.0)
HCT: 40.3 % (ref 36.0–46.0)
Hemoglobin: 12.4 g/dL (ref 12.0–15.0)
Lymphocytes Relative: 38.3 % (ref 12.0–46.0)
Lymphs Abs: 3.4 10*3/uL (ref 0.7–4.0)
MCHC: 30.8 g/dL (ref 30.0–36.0)
MCV: 88.5 fl (ref 78.0–100.0)
Monocytes Absolute: 0.9 10*3/uL (ref 0.1–1.0)
Monocytes Relative: 10 % (ref 3.0–12.0)
Neutro Abs: 4.6 10*3/uL (ref 1.4–7.7)
Neutrophils Relative %: 51 % (ref 43.0–77.0)
Platelets: 303 10*3/uL (ref 150.0–400.0)
RBC: 4.55 Mil/uL (ref 3.87–5.11)
RDW: 15.4 % (ref 11.5–15.5)
WBC: 9 10*3/uL (ref 4.0–10.5)

## 2022-04-02 MED ORDER — FAMOTIDINE 20 MG PO TABS: 20.0000 mg | ORAL_TABLET | Freq: Every day | ORAL | 2 refills | Status: DC

## 2022-04-02 NOTE — Assessment & Plan Note (Addendum)
Resolved completely. Benign abdominal exam. Advised to start pepcid ac 20 qhs.  Strict return precautions and if pain were to return patient to let me know right away.  I would strongly consider abdominal and imaging to evaluate for cholelithiasis, diverticulitis.  Referral to gastroenterology for the evaluation particularly in the setting of chronic constipation

## 2022-04-02 NOTE — Patient Instructions (Addendum)
Your symptoms last week present as acid reflux.  If pain recurs, I want to order abdominal imaging specifically to look at the gallbladder.  Please start Pepcid AC which she can take at bedtime.  I placed a referral to gastroenterology.  Let us know if you dont hear back within a week in regards to an appointment being scheduled.   Food Choices for Gastroesophageal Reflux Disease, Adult When you have gastroesophageal reflux disease (GERD), the foods you eat and your eating habits are very important. Choosing the right foods can help ease the discomfort of GERD. Consider working with a dietitian to help you make healthy food choices. What are tips for following this plan? Reading food labels Look for foods that are low in saturated fat. Foods that have less than 5% of daily value (DV) of fat and 0 g of trans fats may help with your symptoms. Cooking Cook foods using methods other than frying. This may include baking, steaming, grilling, or broiling. These are all methods that do not need a lot of fat for cooking. To add flavor, try to use herbs that are low in spice and acidity. Meal planning  Choose healthy foods that are low in fat, such as fruits, vegetables, whole grains, low-fat dairy products, lean meats, fish, and poultry. Eat frequent, small meals instead of three large meals each day. Eat your meals slowly, in a relaxed setting. Avoid bending over or lying down until 2-3 hours after eating. Limit high-fat foods such as fatty meats or fried foods. Limit your intake of fatty foods, such as oils, butter, and shortening. Avoid the following as told by your health care provider: Foods that cause symptoms. These may be different for different people. Keep a food diary to keep track of foods that cause symptoms. Alcohol. Drinking large amounts of liquid with meals. Eating meals during the 2-3 hours before bed. Lifestyle Maintain a healthy weight. Ask your health care provider what weight is  healthy for you. If you need to lose weight, work with your health care provider to do so safely. Exercise for at least 30 minutes on 5 or more days each week, or as told by your health care provider. Avoid wearing clothes that fit tightly around your waist and chest. Do not use any products that contain nicotine or tobacco. These products include cigarettes, chewing tobacco, and vaping devices, such as e-cigarettes. If you need help quitting, ask your health care provider. Sleep with the head of your bed raised. Use a wedge under the mattress or blocks under the bed frame to raise the head of the bed. Chew sugar-free gum after mealtimes. What foods should I eat?  Eat a healthy, well-balanced diet of fruits, vegetables, whole grains, low-fat dairy products, lean meats, fish, and poultry. Each person is different. Foods that may trigger symptoms in one person may not trigger any symptoms in another person. Work with your health care provider to identify foods that are safe for you. The items listed above may not be a complete list of recommended foods and beverages. Contact a dietitian for more information. What foods should I avoid? Limiting some of these foods may help manage the symptoms of GERD. Everyone is different. Consult a dietitian or your health care provider to help you identify the exact foods to avoid, if any. Fruits Any fruits prepared with added fat. Any fruits that cause symptoms. For some people this may include citrus fruits, such as oranges, grapefruit, pineapple, and lemons. Vegetables Deep-fried vegetables.  [place] fries. Any vegetables prepared with added fat. Any vegetables that cause symptoms. For some people, this may include tomatoes and tomato products, chili peppers, onions and garlic, and horseradish. Grains Pastries or quick breads with added fat. Meats and other proteins High-fat meats, such as fatty beef or pork, hot dogs, ribs, ham, sausage, salami, and bacon. Fried  meat or protein, including fried fish and fried chicken. Nuts and nut butters, in large amounts. Dairy Whole milk and chocolate milk. Sour cream. Cream. Ice cream. Cream cheese. Milkshakes. Fats and oils Butter. Margarine. Shortening. Ghee. Beverages Coffee and tea, with or without caffeine. Carbonated beverages. Sodas. Energy drinks. Fruit juice made with acidic fruits, such as orange or grapefruit. Tomato juice. Alcoholic drinks. Sweets and desserts Chocolate and cocoa. Donuts. Seasonings and condiments Pepper. Peppermint and spearmint. Added salt. Any condiments, herbs, or seasonings that cause symptoms. For some people, this may include curry, hot sauce, or vinegar-based salad dressings. The items listed above may not be a complete list of foods and beverages to avoid. Contact a dietitian for more information. Questions to ask your health care provider Diet and lifestyle changes are usually the first steps that are taken to manage symptoms of GERD. If diet and lifestyle changes do not improve your symptoms, talk with your health care provider about taking medicines. Where to find more information [employer]: aboutgerd.org Summary When you have gastroesophageal reflux disease (GERD), food and lifestyle choices may be very helpful in easing the discomfort of GERD. Eat frequent, small meals instead of three large meals each day. Eat your meals slowly, in a relaxed setting. Avoid bending over or lying down until 2-3 hours after eating. Limit high-fat foods such as fatty meats or fried foods. This information is not intended to replace advice given to you by your health care provider. Make sure you discuss any questions you have with your health care provider. Document Revised: 02/15/2020 Document Reviewed: 02/15/2020 Elsevier Patient Education  [place].

## 2022-04-02 NOTE — Progress Notes (Signed)
Subjective:    Patient ID: Donna Ferguson, female    DOB: June 15, 1941, 81 y.o.   MRN: [id number]  CC: Donna Ferguson is a 81 y.o. female who presents today for an acute visit.    HPI: She is here today after an episode of epigastric pain last week after eating pork chops, since relieved.  She had epigastric pain at sternun, central of chest with nonbloody emesis.  Pepto bismul relieved episode completely.    No cough,  arm numbness, cp, fever, sob, burping, abdominal pain, HA , trouble or pain swallowing. LLQ pain from prior has resolved.    Appetite has returned  No nsaids, alcohol use.   H/o IBS- constipation. Constipation exacerbated after recent knee surgery, subsequent narcotics.  Last BM 2 days ago Miralax is not working. She is taking colace with relief and bowel movements are regular.    No colonoscopy /EGD in chart. She has never had EGD    CT abdomen and pelvis obtained 11/18/2018.  Without acute abnormality.  Diverticulosis present.  Moderate stool.  Prominently/mildly enlarged lymph nodes. CT abdomen pelvis scheduled again for 05/01/2022 from Dr Tasia Catchings HISTORY:  Past Medical History:  Diagnosis Date   Arthritis    Chronic kidney disease, stage 3a ([facility])    cyst near kidney   Constipation    Heart murmur    past   History of colonoscopy    2019 in [place]; patient states no longer screening for colon cancer.   Hypertension    IBS (irritable bowel syndrome)    Mitral valve insufficiency    Pneumonia    PONV (postoperative nausea and vomiting)    Pre-diabetes    Renal cyst    Right leg DVT ([facility])    Past Surgical History:  Procedure Laterality Date   ABDOMINAL HYSTERECTOMY     unsure if has ovaries.    BREAST EXCISIONAL BIOPSY Bilateral late 80s, early 90s    benign   COLONOSCOPY     cyst N/A    cyst removal both breast   REPLACEMENT TOTAL KNEE Left 2017   TOTAL KNEE ARTHROPLASTY Right 12/28/2021   Procedure: TOTAL KNEE ARTHROPLASTY;  Surgeon: Corky Mull,  MD;  Location: [facility] ORS;  Service: Orthopedics;  Laterality: Right;   Family History  Problem Relation Age of Onset   Hypertension Mother    Diabetes Father    Hypertension Father    Heart disease Father    Hypertension Sister    Throat cancer Sister    Hypertension Daughter    Breast cancer Neg Hx     Allergies: Patient has no known allergies. Current Outpatient Medications on File Prior to Visit  Medication Sig Dispense Refill   acetaminophen (TYLENOL) 325 MG tablet Take 1-2 tablets (325-650 mg total) by mouth every 6 (six) hours as needed for mild pain (pain score 1-3 or temp > 100.5). 60 tablet 0   amLODipine (NORVASC) 5 MG tablet TAKE 1 TABLET(5 MG) BY MOUTH EVERY EVENING 90 tablet 1   apixaban (ELIQUIS) 2.5 MG TABS tablet Take 1 tablet (2.5 mg total) by mouth 2 (two) times daily. 30 tablet 0   cholecalciferol (VITAMIN D3) 25 MCG (1000 UNIT) tablet Take 1,000 Units by mouth daily.     losartan (COZAAR) 25 MG tablet TAKE 1 TABLET(25 MG) BY MOUTH DAILY 90 tablet 1   metoprolol succinate (TOPROL-XL) 50 MG 24 hr tablet TAKE 1 TABLET(50 MG) BY MOUTH DAILY WITH OR IMMEDIATELY FOLLOWING A MEAL 90 tablet 1  ondansetron (ZOFRAN) 4 MG tablet Take 1 tablet (4 mg total) by mouth every 6 (six) hours as needed for nausea. 30 tablet 0   oxyCODONE (OXY IR/ROXICODONE) 5 MG immediate release tablet Take 1-2 tablets (5-10 mg total) by mouth every 4 (four) hours as needed for moderate pain (pain score 4-6). 60 tablet 0   polyethylene glycol powder (GLYCOLAX/MIRALAX) 17 GM/SCOOP powder Take 1 Container by mouth daily as needed.     Trospium Chloride 60 MG CP24 Take 1 capsule (60 mg total) by mouth every morning. 90 capsule 1   vitamin C (ASCORBIC ACID) 500 MG tablet Take 500 mg by mouth daily.     No current facility-administered medications on file prior to visit.    Social History   Tobacco Use   Smoking status: Never    Passive exposure: Past   Smokeless tobacco: Never  Vaping Use   Vaping  Use: Never used  Substance Use Topics   Alcohol use: Never   Drug use: Never    Review of Systems  Constitutional:  Negative for chills and fever.  Respiratory:  Negative for cough.   Cardiovascular:  Negative for chest pain and palpitations.  Gastrointestinal:  Negative for abdominal pain, nausea and vomiting (resolved).      Objective:    BP 124/80 (BP Location: Left Arm, Patient Position: Sitting, Cuff Size: Normal)   Pulse 83   Temp 98.3 F (36.8 C) (Oral)   Ht '5\' 3"'$  (1.6 m)   Wt 152 lb 9.6 oz (69.2 kg)   SpO2 99%   BMI 27.03 kg/m    Physical Exam Vitals reviewed.  Constitutional:      Appearance: Normal appearance. She is well-developed.  Eyes:     Conjunctiva/sclera: Conjunctivae normal.  Cardiovascular:     Rate and Rhythm: Normal rate and regular rhythm.     Pulses: Normal pulses.     Heart sounds: Normal heart sounds.  Pulmonary:     Effort: Pulmonary effort is normal.     Breath sounds: Normal breath sounds. No wheezing, rhonchi or rales.  Abdominal:     General: Bowel sounds are normal. There is no distension.     Palpations: Abdomen is soft. Abdomen is not rigid. There is no fluid wave or mass.     Tenderness: There is no abdominal tenderness. There is no guarding or rebound.  Skin:    General: Skin is warm and dry.  Neurological:     Mental Status: She is alert.  Psychiatric:        Speech: Speech normal.        Behavior: Behavior normal.        Thought Content: Thought content normal.        Assessment & Plan:   Problem List Items Addressed This Visit       Other   Epigastric pain - Primary    Resolved completely. Benign abdominal exam. Advised to start pepcid ac 20 qhs.  Strict return precautions and if pain were to return patient to let me know right away.  I would strongly consider abdominal and imaging to evaluate for cholelithiasis, diverticulitis.  Referral to gastroenterology for the evaluation particularly in the setting of chronic  constipation      Relevant Medications   famotidine (PEPCID) 20 MG tablet   Other Relevant Orders   Ambulatory referral to Gastroenterology   Comprehensive metabolic panel   CBC with Differential/Platelet      I am having Alfredia Benedetti start on  famotidine. I am also having her maintain her ascorbic acid, losartan, metoprolol succinate, amLODipine, cholecalciferol, polyethylene glycol powder, oxyCODONE, acetaminophen, ondansetron, apixaban, and Trospium Chloride.   Meds ordered this encounter  Medications   famotidine (PEPCID) 20 MG tablet    Sig: Take 1 tablet (20 mg total) by mouth at bedtime.    Dispense:  30 tablet    Refill:  2    Order Specific Question:   Supervising Provider    Answer:   Crecencio Mc [[id number]]    Return precautions given.   Risks, benefits, and alternatives of the medications and treatment plan prescribed today were discussed, and patient expressed understanding.   Education regarding symptom management and diagnosis given to patient on AVS.  Continue to follow with Burnard Hawthorne, FNP for routine health maintenance.   Josalyn Ottley and I agreed with plan.   [name], FNP

## 2022-04-03 ENCOUNTER — Other Ambulatory Visit: Payer: Self-pay | Admitting: Family

## 2022-04-03 DIAGNOSIS — R7303 Prediabetes: Secondary | ICD-10-CM

## 2022-05-01 ENCOUNTER — Ambulatory Visit
Admission: RE | Admit: 2022-05-01 | Discharge: 2022-05-01 | Disposition: A | Discharge status: Home / Self Care | Payer: Medicare Other | Source: Ambulatory Visit | Attending: Oncology | Admitting: Oncology

## 2022-05-01 ENCOUNTER — Other Ambulatory Visit: Payer: Self-pay | Admitting: Oncology

## 2022-05-01 DIAGNOSIS — R591 Generalized enlarged lymph nodes: Secondary | ICD-10-CM | POA: Insufficient documentation

## 2022-05-01 DIAGNOSIS — R7989 Other specified abnormal findings of blood chemistry: Secondary | ICD-10-CM

## 2022-05-01 DIAGNOSIS — R933 Abnormal findings on diagnostic imaging of other parts of digestive tract: Secondary | ICD-10-CM | POA: Insufficient documentation

## 2022-05-02 ENCOUNTER — Telehealth: Payer: Self-pay | Admitting: *Deleted

## 2022-05-02 NOTE — Telephone Encounter (Signed)
Called report  IMPRESSION: 1. New circumferential wall thickening of the right colon suspicious for malignancy. 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 2. 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 3. No evidence of bowel obstruction or perforation. 4. No definite signs of extranodal metastatic disease on noncontrast imaging. 5. Further evaluation with colonoscopy and/or PET-CT recommended. 6. These results will be called to the ordering clinician or representative by the Radiologist Assistant, and communication documented in the PACS or [employer].     Electronically Signed   By: Richardean Sale M.D.   On: 05/01/2022 19:42

## 2022-05-03 ENCOUNTER — Inpatient Hospital Stay: Payer: Medicare Other | Attending: Oncology

## 2022-05-03 ENCOUNTER — Inpatient Hospital Stay (HOSPITAL_BASED_OUTPATIENT_CLINIC_OR_DEPARTMENT_OTHER): Payer: Medicare Other | Admitting: Oncology

## 2022-05-03 ENCOUNTER — Other Ambulatory Visit: Payer: Self-pay

## 2022-05-03 ENCOUNTER — Telehealth: Payer: Self-pay

## 2022-05-03 ENCOUNTER — Encounter: Payer: Self-pay | Admitting: Oncology

## 2022-05-03 VITALS — BP 145/67 | HR 75 | Temp 96.6°F | Resp 18 | Wt 149.9 lb

## 2022-05-03 DIAGNOSIS — I129 Hypertensive chronic kidney disease with stage 1 through stage 4 chronic kidney disease, or unspecified chronic kidney disease: Secondary | ICD-10-CM | POA: Insufficient documentation

## 2022-05-03 DIAGNOSIS — R591 Generalized enlarged lymph nodes: Secondary | ICD-10-CM

## 2022-05-03 DIAGNOSIS — R7989 Other specified abnormal findings of blood chemistry: Secondary | ICD-10-CM

## 2022-05-03 DIAGNOSIS — R933 Abnormal findings on diagnostic imaging of other parts of digestive tract: Secondary | ICD-10-CM

## 2022-05-03 DIAGNOSIS — K581 Irritable bowel syndrome with constipation: Secondary | ICD-10-CM | POA: Insufficient documentation

## 2022-05-03 DIAGNOSIS — Z808 Family history of malignant neoplasm of other organs or systems: Secondary | ICD-10-CM | POA: Diagnosis not present

## 2022-05-03 DIAGNOSIS — N1831 Chronic kidney disease, stage 3a: Secondary | ICD-10-CM | POA: Insufficient documentation

## 2022-05-03 DIAGNOSIS — R590 Localized enlarged lymph nodes: Secondary | ICD-10-CM | POA: Insufficient documentation

## 2022-05-03 DIAGNOSIS — Z90710 Acquired absence of both cervix and uterus: Secondary | ICD-10-CM | POA: Insufficient documentation

## 2022-05-03 DIAGNOSIS — K639 Disease of intestine, unspecified: Secondary | ICD-10-CM

## 2022-05-03 LAB — CBC WITH DIFFERENTIAL/PLATELET
Abs Immature Granulocytes: 0.03 10*3/uL (ref 0.00–0.07)
Basophils Absolute: 0 10*3/uL (ref 0.0–0.1)
Basophils Relative: 1 %
Eosinophils Absolute: 0 10*3/uL (ref 0.0–0.5)
Eosinophils Relative: 0 %
HCT: 40.7 % (ref 36.0–46.0)
Hemoglobin: 13.2 g/dL (ref 12.0–15.0)
Immature Granulocytes: 0 %
Lymphocytes Relative: 37 %
Lymphs Abs: 3.1 10*3/uL (ref 0.7–4.0)
MCH: 27.8 pg (ref 26.0–34.0)
MCHC: 32.4 g/dL (ref 30.0–36.0)
MCV: 85.7 fL (ref 80.0–100.0)
Monocytes Absolute: 0.8 10*3/uL (ref 0.1–1.0)
Monocytes Relative: 9 %
Neutro Abs: 4.5 10*3/uL (ref 1.7–7.7)
Neutrophils Relative %: 53 %
Platelets: 331 10*3/uL (ref 150–400)
RBC: 4.75 MIL/uL (ref 3.87–5.11)
RDW: 14.8 % (ref 11.5–15.5)
WBC: 8.5 10*3/uL (ref 4.0–10.5)
nRBC: 0 % (ref 0.0–0.2)

## 2022-05-03 LAB — COMPREHENSIVE METABOLIC PANEL
ALT: 11 U/L (ref 0–44)
AST: 20 U/L (ref 15–41)
Albumin: 4 g/dL (ref 3.5–5.0)
Alkaline Phosphatase: 94 U/L (ref 38–126)
Anion gap: 7 (ref 5–15)
BUN: 19 mg/dL (ref 8–23)
CO2: 25 mmol/L (ref 22–32)
Calcium: 9.6 mg/dL (ref 8.9–10.3)
Chloride: 106 mmol/L (ref 98–111)
Creatinine, Ser: 0.93 mg/dL (ref 0.44–1.00)
GFR, Estimated: >60 mL/min (ref >60)
Glucose, Bld: 113 mg/dL — ABNORMAL HIGH (ref 70–99)
Potassium: 4 mmol/L (ref 3.5–5.1)
Sodium: 138 mmol/L (ref 135–145)
Total Bilirubin: 0.6 mg/dL (ref 0.3–1.2)
Total Protein: 7.9 g/dL (ref 6.5–8.1)

## 2022-05-03 MED ORDER — NA SULFATE-K SULFATE-MG SULF 17.5-3.13-1.6 GM/177ML PO SOLN: 354.0000 mL | Freq: Once | ORAL | 0 refills | Status: AC

## 2022-05-03 NOTE — Telephone Encounter (Signed)
Called patient and schedule patient for 05/31/2022 with Dr. Marius Ditch. Went over instructions, mailed them and sent to [employer]

## 2022-05-03 NOTE — Progress Notes (Signed)
Hematology/Oncology Consult note [phone] [phone]         Patient Care Team: Burnard Hawthorne, FNP as PCP - General (Family Medicine) Kate Sable, MD as PCP - Cardiology (Cardiology) Earlie Server, MD as Consulting Physician (Oncology) Clent Jacks, RN as Oncology Nurse Navigator  REFERRING PROVIDER: Burnard Hawthorne, FNP  CHIEF COMPLAINTS/REASON FOR VISIT:  lymphadenopathy  HISTORY OF PRESENTING ILLNESS:   Donna Ferguson is a  81 y.o.  female with PMH listed below was seen in consultation at the request of  Burnard Hawthorne, FNP  for evaluation of lymphadenopathy.   Patient was recently seen by primary care provider.  She has experienced left lower quadrant pain.  Patient reports symptoms being triggered after eating fatty food.  She has a history of IBS. 11/17/2021, CT abdomen pelvis without contrast was obtained for further evaluation.  CT showed no acute abnormality of the abdomen or pelvis.  Scattered sigmoid colonic diverticulosis without findings of acute diverticulitis.  Nonspecific prominent/mildly enlarged lymph nodes along the mesenteric root measuring up to 13 mm in short axis previously measuring up to 9 mm.  Constipation.    INTERVAL HISTORY Donna Ferguson is a 81 y.o. female who has above history reviewed by me today presents for follow up visit for mesenteric lymphadenopathy. Patient has chronic constipation.  She reports history of IBS Denies any unintentional weight loss, abdominal pain, or blood in the stool. Patient had colonoscopy done 5 to 7 years ago when she was in [place].  Per patient, she had polyps removed at that point.  Denies any family history of colon cancer.  Review of Systems  Constitutional:  Negative for appetite change, chills, fatigue and fever.  HENT:   Negative for hearing loss and voice change.   Eyes:  Negative for eye problems.  Respiratory:  Negative for chest tightness and cough.    Cardiovascular:  Negative for chest pain.  Gastrointestinal:  Positive for constipation. Negative for abdominal distention, abdominal pain and blood in stool.       Chronic IBS symptoms  Endocrine: Negative for hot flashes.  Genitourinary:  Negative for difficulty urinating and frequency.   Musculoskeletal:  Negative for arthralgias.  Skin:  Negative for itching and rash.  Neurological:  Negative for extremity weakness.  Hematological:  Negative for adenopathy.  Psychiatric/Behavioral:  Negative for confusion.     MEDICAL HISTORY:  Past Medical History:  Diagnosis Date   Arthritis    Chronic kidney disease, stage 3a ([place])    cyst near kidney   Constipation    Heart murmur    past   History of colonoscopy    2019 in [place]; patient states no longer screening for colon cancer.   Hypertension    IBS (irritable bowel syndrome)    Mitral valve insufficiency    Pneumonia    PONV (postoperative nausea and vomiting)    Pre-diabetes    Renal cyst    Right leg DVT ([place])     SURGICAL HISTORY: Past Surgical History:  Procedure Laterality Date   ABDOMINAL HYSTERECTOMY     unsure if has ovaries.    BREAST EXCISIONAL BIOPSY Bilateral late 80s, early 90s    benign   COLONOSCOPY     cyst N/A    cyst removal both breast   REPLACEMENT TOTAL KNEE Left 2017   TOTAL KNEE ARTHROPLASTY Right 12/28/2021   Procedure: TOTAL KNEE ARTHROPLASTY;  Surgeon: Corky Mull, MD;  Location: [facility] ORS;  Service: Orthopedics;  Laterality: Right;    SOCIAL HISTORY: Social History   Socioeconomic History   Marital status: Married    Spouse name: Roosevelt   Number of children: 2   Years of education: Not on file   Highest education level: Not on file  Occupational History   Not on file  Tobacco Use   Smoking status: Never    Passive exposure: Past   Smokeless tobacco: Never  Vaping Use   Vaping Use: Never used  Substance and Sexual Activity   Alcohol use: Never   Drug use: Never   Sexual  activity: Yes    Birth control/protection: Surgical  Other Topics Concern   Not on file  Social History Narrative   [place] in [place]- Retired   2 children son & daughter   Married   From [place]   Retired 2011.    Enjoys painting   Social Determinants of Health   Financial Resource Strain: Low Risk  (07/27/2021)   Overall Financial Resource Strain (CARDIA)    Difficulty of Paying Living Expenses: Not hard at all  Food Insecurity: No Food Insecurity (07/27/2021)   Hunger Vital Sign    Worried About Running Out of Food in the Last Year: Never true    Ran Out of Food in the Last Year: Never true  Transportation Needs: No Transportation Needs (07/27/2021)   PRAPARE - Hydrologist (Medical): No    Lack of Transportation (Non-Medical): No  Physical Activity: Not on file  Stress: No Stress Concern Present (07/27/2021)   [place]    Feeling of Stress : Not at all  Social Connections: Unknown (07/27/2021)   Social Connection and Isolation Panel [NHANES]    Frequency of Communication with Friends and Family: Not on file    Frequency of Social Gatherings with Friends and Family: Not on file    Attends Religious Services: Not on file    Active Member of Clubs or Organizations: Not on file    Attends Archivist Meetings: Not on file    Marital Status: Married  Intimate Partner Violence: Not At Risk (07/27/2021)   Humiliation, Afraid, Rape, and Kick questionnaire    Fear of Current or Ex-Partner: No    Emotionally Abused: No    Physically Abused: No    Sexually Abused: No    FAMILY HISTORY: Family History  Problem Relation Age of Onset   Hypertension Mother    Diabetes Father    Hypertension Father    Heart disease Father    Hypertension Sister    Throat cancer Sister    Hypertension Daughter    Breast cancer Neg Hx     ALLERGIES:  has No  Known Allergies.  MEDICATIONS:  Current Outpatient Medications  Medication Sig Dispense Refill   acetaminophen (TYLENOL) 325 MG tablet Take 1-2 tablets (325-650 mg total) by mouth every 6 (six) hours as needed for mild pain (pain score 1-3 or temp > 100.5). 60 tablet 0   amLODipine (NORVASC) 5 MG tablet TAKE 1 TABLET(5 MG) BY MOUTH EVERY EVENING 90 tablet 1   bismuth subsalicylate (PEPTO BISMOL) 262 MG/15ML suspension Take 30 mLs by mouth every 6 (six) hours as needed.     cholecalciferol (VITAMIN D3) 25 MCG (1000 UNIT) tablet Take 1,000 Units by mouth daily.     famotidine (PEPCID) 20 MG tablet Take 1 tablet (20 mg total) by mouth at bedtime.  30 tablet 2   losartan (COZAAR) 25 MG tablet TAKE 1 TABLET(25 MG) BY MOUTH DAILY 90 tablet 1   Magnesium Hydroxide (DULCOLAX PO) Take 1 tablet by mouth every other day.     metoprolol succinate (TOPROL-XL) 50 MG 24 hr tablet TAKE 1 TABLET(50 MG) BY MOUTH DAILY WITH OR IMMEDIATELY FOLLOWING A MEAL 90 tablet 1   polyethylene glycol powder (GLYCOLAX/MIRALAX) 17 GM/SCOOP powder Take 1 Container by mouth daily as needed.     Trospium Chloride 60 MG CP24 Take 1 capsule (60 mg total) by mouth every morning. 90 capsule 1   vitamin C (ASCORBIC ACID) 500 MG tablet Take 500 mg by mouth daily.     Na Sulfate-K Sulfate-Mg Sulf 17.5-3.13-1.6 GM/177ML SOLN Take 354 mLs by mouth once for 1 dose. 354 mL 0   oxyCODONE (OXY IR/ROXICODONE) 5 MG immediate release tablet Take 1-2 tablets (5-10 mg total) by mouth every 4 (four) hours as needed for moderate pain (pain score 4-6). (Patient not taking: Reported on 05/03/2022) 60 tablet 0   No current facility-administered medications for this visit.     PHYSICAL EXAMINATION: ECOG PERFORMANCE STATUS: 0 - Asymptomatic Vitals:   05/03/22 1009  BP: (!) 145/67  Pulse: 75  Resp: 18  Temp: (!) 96.6 F (35.9 C)   Filed Weights   05/03/22 1009  Weight: 149 lb 14.4 oz (68 kg)    Physical Exam Constitutional:      General:  She is not in acute distress. HENT:     Head: Normocephalic and atraumatic.  Eyes:     General: No scleral icterus. Cardiovascular:     Rate and Rhythm: Normal rate and regular rhythm.     Heart sounds: Normal heart sounds.  Pulmonary:     Effort: Pulmonary effort is normal. No respiratory distress.     Breath sounds: No wheezing.  Abdominal:     General: Bowel sounds are normal. There is no distension.     Palpations: Abdomen is soft.  Musculoskeletal:        General: No deformity. Normal range of motion.     Cervical back: Normal range of motion and neck supple.  Skin:    General: Skin is warm and dry.     Findings: No erythema or rash.  Neurological:     Mental Status: She is alert and oriented to person, place, and time. Mental status is at baseline.     Cranial Nerves: No cranial nerve deficit.     Coordination: Coordination normal.  Psychiatric:        Mood and Affect: Mood normal.     LABORATORY DATA:  I have reviewed the data as listed    Latest Ref Rng & Units 05/03/2022    9:46 AM 04/02/2022   10:56 AM 12/29/2021    5:30 AM  CBC  WBC 4.0 - 10.5 K/uL 8.5  9.0  12.5   Hemoglobin 12.0 - 15.0 g/dL 13.2  12.4  12.2   Hematocrit 36.0 - 46.0 % 40.7  40.3  37.4   Platelets 150 - 400 K/uL 331  303.0  246       Latest Ref Rng & Units 05/03/2022    9:46 AM 04/02/2022   10:56 AM 12/29/2021    5:30 AM  CMP  Glucose 70 - 99 mg/dL 113  117  142   BUN 8 - 23 mg/dL '19  17  22   '$ Creatinine 0.44 - 1.00 mg/dL 0.93  0.95  1.21  Sodium 135 - 145 mmol/L 138  140  137   Potassium 3.5 - 5.1 mmol/L 4.0  3.6  4.1   Chloride 98 - 111 mmol/L 106  102  103   CO2 22 - 32 mmol/L '25  25  26   '$ Calcium 8.9 - 10.3 mg/dL 9.6  9.7  9.3   Total Protein 6.5 - 8.1 g/dL 7.9  6.9    Total Bilirubin 0.3 - 1.2 mg/dL 0.6  0.4    Alkaline Phos 38 - 126 U/L 94  88    AST 15 - 41 U/L 20  14    ALT 0 - 44 U/L 11  8        RADIOGRAPHIC STUDIES: I have personally reviewed the radiological images  as listed and agreed with the findings in the report. CT Abdomen Pelvis Wo Contrast  Result Date: 05/01/2022 CLINICAL DATA:  Right flank and abdominal pain for 3 months. History of IBS with chronic constipation. Follow up lymphadenopathy. EXAM: CT ABDOMEN AND PELVIS WITHOUT CONTRAST TECHNIQUE: Multidetector CT imaging of the abdomen and pelvis was performed following the standard protocol without IV contrast. RADIATION DOSE REDUCTION: This exam was performed according to the departmental dose-optimization program which includes automated exposure control, adjustment of the mA and/or kV according to patient size and/or use of iterative reconstruction technique. COMPARISON:  Abdominopelvic CT 11/17/2021 and 12/22/2019. FINDINGS: Lower chest: Clear lung bases. No significant pleural or pericardial effusion. Hepatobiliary: The liver is normal in density without suspicious focal abnormality on noncontrast imaging. Small low-density hepatic lesions are grossly stable. No evidence of gallstones, gallbladder wall thickening or biliary dilatation. Pancreas: Unremarkable. No pancreatic ductal dilatation or surrounding inflammatory changes. Spleen: The spleen is small without focal abnormality. Adrenals/Urinary Tract: Both adrenal glands appear unchanged. No evidence of urinary tract calculus, hydronephrosis or suspicious renal lesion on noncontrast imaging. Scattered low-density renal cysts measuring up to 5.0 cm in the lower pole of the right kidney (image 35/2) and 8 mm hyperdense lesion in the interpolar region of the left kidney (coronal image 42/5) are unchanged, consistent with a Bosniak 1 and 2 cysts. No specific imaging follow-of these lesions is recommended. The bladder appears unremarkable for its degree of distention. Stomach/Bowel: Enteric contrast was administered and has passed into distal transverse colon. The stomach appears unremarkable for its degree of distention. The proximal small bowel appears  unremarkable. Several nonobstructing polypoid filling defects are suggested within small bowel loops in the pelvis. In addition, there is circumferential wall thickening of the right colon near the ileocecal valve which was not apparent previously, suspicious for infiltrating neoplasm. No evidence of bowel obstruction or perforation. The appendix appears normal. No distal colonic abnormalities are seen. Vascular/Lymphatic: Mildly progressive lymphadenopathy in the ileocolonic mesentery, suspicious for metastatic disease. Largest node currently measures 1.4 x 1.3 cm on image 53/2 (previously 1.3 x 1.0 cm). An adjacent 1.2 x 1.2 cm node on image 56/2 previously measured 1.5 x 1.3 cm. No retroperitoneal adenopathy. No significant vascular findings on noncontrast imaging. Reproductive: Hysterectomy. No adnexal mass. Suspected mild pelvic floor laxity. Other: Intact abdominal wall.  No ascites or peritoneal nodularity. Musculoskeletal: No acute or significant osseous findings. Multilevel spondylosis associated with a thoracolumbar scoliosis. IMPRESSION: 1. New circumferential wall thickening of the right colon suspicious for malignancy. 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 2. 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 3. No evidence of bowel  obstruction or perforation. 4. No definite signs of extranodal metastatic disease on noncontrast imaging. 5. Further evaluation with colonoscopy and/or PET-CT recommended. 6. These results will be called to the ordering clinician or representative by the Radiologist Assistant, and communication documented in the PACS or [employer]. Electronically Signed   By: Richardean Sale M.D.   On: 05/01/2022 19:42      ASSESSMENT & PLAN:  1. Abnormal CT scan, colon   2. 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 Refer patient urgently to gastroenterology for colonoscopy evaluation.  Discussed with Dr. Marius Ditch. Check CEA.   Chronic kidney disease, stage IIIa, avoid nephrotoxins.   Orders Placed This Encounter  Procedures   Ambulatory referral to Gastroenterology    Referral Priority:   Urgent    Referral Type:   Consultation    Referral Reason:   Specialty Services Required    Referred to Provider:   Lin Landsman, MD    Number of Visits Requested:   1    All questions were answered. The patient knows to call the clinic with any problems questions or concerns.  cc Burnard Hawthorne, FNP    Return of visit: To be determined. Thank you for this kind referral and the opportunity to participate in the care of this patient. A copy of today's note is routed to referring provider   Earlie Server, MD, PhD [facility] Hematology Oncology 05/03/2022

## 2022-05-03 NOTE — Progress Notes (Signed)
Pt here for follow up. No new concerns voiced.   

## 2022-05-03 NOTE — Telephone Encounter (Signed)
Per Dr. Tasia Catchings Dr.Vanga, I see this patient for abdomen lymphadenopathy. recent CT showed circumferential wall thickening of the right colon. she is way overdue for colonoscopy. last one was in [place], about 5-7 years ago  Per Dr. Devota Pace, please schedule her colonoscopy, thanks diagnosis is Thickening of the right colon, abnormal CT of the colon

## 2022-05-04 ENCOUNTER — Telehealth: Payer: Self-pay | Admitting: *Deleted

## 2022-05-04 NOTE — Telephone Encounter (Signed)
Patient called to update Dr. Tasia Catchings that colonoscopy has been scheduled for 05/31/22. Patient stated her follow up with Dr. Tasia Catchings was pending date of procedure.

## 2022-05-05 LAB — CEA: CEA: 2 ng/mL (ref 0.0–4.7)

## 2022-05-07 NOTE — Telephone Encounter (Signed)
Please schedule pt for MD only - 1 week after 10/12 for results review. Please inform pt of appt.

## 2022-05-16 ENCOUNTER — Ambulatory Visit (INDEPENDENT_AMBULATORY_CARE_PROVIDER_SITE_OTHER): Payer: Medicare Other | Admitting: Internal Medicine

## 2022-05-16 ENCOUNTER — Encounter: Payer: Self-pay | Admitting: Internal Medicine

## 2022-05-16 VITALS — BP 126/60 | HR 58 | Temp 98.2°F | Ht 63.0 in | Wt 149.4 lb

## 2022-05-16 DIAGNOSIS — L309 Dermatitis, unspecified: Secondary | ICD-10-CM | POA: Diagnosis not present

## 2022-05-16 DIAGNOSIS — L03115 Cellulitis of right lower limb: Secondary | ICD-10-CM | POA: Diagnosis not present

## 2022-05-16 MED ORDER — MUPIROCIN 2 % EX OINT: 1.0000 | TOPICAL_OINTMENT | Freq: Two times a day (BID) | CUTANEOUS | 0 refills | Status: DC

## 2022-05-16 MED ORDER — DOXYCYCLINE HYCLATE 100 MG PO TABS: 100.0000 mg | ORAL_TABLET | Freq: Two times a day (BID) | ORAL | 0 refills | Status: DC

## 2022-05-16 MED ORDER — HYDROCORTISONE 2.5 % EX OINT: TOPICAL_OINTMENT | Freq: Two times a day (BID) | CUTANEOUS | 0 refills | Status: DC

## 2022-05-16 NOTE — Patient Instructions (Addendum)
Dove antibacterial body wash  Cetaphil/cerave cream   Bactroban  + hydrocortisone creams 2x per day  With  Doxycycline 2x per day x 1 week with food and full glass of water   Eczema Eczema refers to a group of skin conditions that cause skin to become rough and inflamed. Each type of eczema has different triggers, symptoms, and treatments. Eczema of any type is usually itchy. Symptoms range from mild to severe. Eczema is not spread from person to person (is not contagious). It can appear on different parts of the body at different times. One person's eczema may look different from another person's eczema. What are the causes? The exact cause of this condition is not known. However, exposure to certain environmental factors, irritants, and allergens can make the condition worse. What are the signs or symptoms? Symptoms of this condition depend on the type of eczema you have. The types include: Contact dermatitis. There are two kinds: Irritant contact dermatitis. This happens when something irritates the skin and causes a rash. Allergic contact dermatitis. This happens when your skin comes in contact with something you are allergic to (allergens). This can include poison ivy, chemicals, or medicines that were applied to your skin. Atopic dermatitis. This is a long-term (chronic) skin disease that keeps coming back (recurring). It is the most common type of eczema. Usual symptoms are a red rash and itchy, dry, scaly skin. It usually starts showing signs in infancy and can last through adulthood. Dyshidrotic eczema. This is a form of eczema on the hands and feet. It shows up as very itchy, fluid-filled blisters. It can affect people of any age but is more common before age 66. Hand eczema. This causes very itchy areas of skin on the palms and sides of the hands and fingers. This type of eczema is common in industrial jobs where you may be exposed to different types of irritants. Lichen simplex  chronicus. This type of eczema occurs when a person constantly scratches one area of the body. Repeated scratching of the area leads to thickened skin (lichenification). This condition can accompany other types of eczema. It is more common in adults but may also be seen in children. Nummular eczema. This is a common type of eczema that most often affects the lower legs and the backs of the hands. It typically causes an itchy, red, circular, crusty lesion (plaque). Scratching may become a habit and can cause bleeding. Nummular eczema occurs most often in middle-aged or older people. Seborrheic dermatitis. This is a common skin disease that mainly affects the scalp. It may also affect other oily areas of the body, such as the face, sides of the nose, eyebrows, ears, eyelids, and chest. It is marked by small scaling and redness of the skin (erythema). This can affect people of all ages. In infants, this condition is called cradle cap. Stasis dermatitis. This is a common skin disease that can cause itching, scaling, and hyperpigmentation, usually on the legs and feet. It occurs most often in people who have a condition that prevents blood from being pumped through the veins in the legs (chronic venous insufficiency). Stasis dermatitis is a chronic condition that needs long-term management. How is this diagnosed? This condition may be diagnosed based on: A physical exam of your skin. Your medical history. Skin patch tests. These tests involve using patches that contain possible allergens and placing them on your back. Your health care provider will check in a few days to see if an allergic  reaction occurred. How is this treated? Treatment for eczema is based on the type of eczema you have. You may be given hydrocortisone steroid medicine or antihistamines. These can relieve itching quickly and help reduce inflammation. These may be prescribed or purchased over the counter, depending on the strength that is  needed. Follow these instructions at home: Take or apply over-the-counter and prescription medicines only as told by your health care provider. Use creams or ointments to moisturize your skin. Do not use lotions. Learn what triggers or irritates your symptoms so you can avoid these things. Treat symptom flare-ups quickly. Do not scratch your skin. This can make your rash worse. Keep all follow-up visits. This is important. Where to find more information American Academy of Dermatology: MemberVerification.ca National Eczema Association: nationaleczema.org The Society for Pediatric Dermatology: pedsderm.net Contact a health care provider if: You have severe itching, even with treatment. You scratch your skin regularly until it bleeds. Your rash looks different than usual. Your skin is painful, swollen, or more red than usual. You have a fever. Summary Eczema refers to a group of skin conditions that cause skin to become rough and inflamed. Each type has different triggers. Eczema of any type causes itching that may range from mild to severe. Treatment varies based on the type of eczema you have. Hydrocortisone steroid medicine or antihistamines can help with itching and inflammation. Protecting your skin is the best way to prevent eczema. Use creams or ointments to moisturize your skin. Avoid triggers and irritants. Treat flare-ups quickly. This information is not intended to replace advice given to you by your health care provider. Make sure you discuss any questions you have with your health care provider. Document Revised: 05/16/2020 Document Reviewed: 05/16/2020 Elsevier Patient Education  [place].

## 2022-05-16 NOTE — Progress Notes (Signed)
Chief Complaint  Patient presents with   Wound Check    Pt has a mark on her right leg next to where she got surgery done at.   F/u  1. Right knee rash to the side or prior right total knee rash been there x 3 months with itching tried neosporin and bacitracin otc w/o help  She normally has sensitive skin and rashes and picked off the top of this rash     Review of Systems  Skin:  Positive for itching and rash.   Past Medical History:  Diagnosis Date   Arthritis    Chronic kidney disease, stage 3a ([place])    cyst near kidney   Constipation    Heart murmur    past   History of colonoscopy    2019 in [place]; patient states no longer screening for colon cancer.   Hypertension    IBS (irritable bowel syndrome)    Mitral valve insufficiency    Pneumonia    PONV (postoperative nausea and vomiting)    Pre-diabetes    Renal cyst    Right leg DVT (HCC)    Past Surgical History:  Procedure Laterality Date   ABDOMINAL HYSTERECTOMY     unsure if has ovaries.    BREAST EXCISIONAL BIOPSY Bilateral late 80s, early 90s    benign   COLONOSCOPY     cyst N/A    cyst removal both breast   REPLACEMENT TOTAL KNEE Left 2017   TOTAL KNEE ARTHROPLASTY Right 12/28/2021   Procedure: TOTAL KNEE ARTHROPLASTY;  Surgeon: Corky Mull, MD;  Location: [facility] ORS;  Service: Orthopedics;  Laterality: Right;   Family History  Problem Relation Age of Onset   Hypertension Mother    Diabetes Father    Hypertension Father    Heart disease Father    Hypertension Sister    Throat cancer Sister    Hypertension Daughter    Breast cancer Neg Hx    Social History   Socioeconomic History   Marital status: Married    Spouse name: Roosevelt   Number of children: 2   Years of education: Not on file   Highest education level: Not on file  Occupational History   Not on file  Tobacco Use   Smoking status: Never    Passive exposure: Past   Smokeless tobacco: Never  Vaping Use   Vaping Use: Never used   Substance and Sexual Activity   Alcohol use: Never   Drug use: Never   Sexual activity: Yes    Birth control/protection: Surgical  Other Topics Concern   Not on file  Social History Narrative   [place] in [place]- Retired   2 children son & daughter   Married   From [place] by [address] of [place]   Retired 2011.    Enjoys painting   Social Determinants of Health   Financial Resource Strain: Low Risk  (07/27/2021)   Overall Financial Resource Strain (CARDIA)    Difficulty of Paying Living Expenses: Not hard at all  Food Insecurity: No Food Insecurity (07/27/2021)   Hunger Vital Sign    Worried About Running Out of Food in the Last Year: Never true    Ran Out of Food in the Last Year: Never true  Transportation Needs: No Transportation Needs (07/27/2021)   PRAPARE - Hydrologist (Medical): No    Lack of Transportation (Non-Medical): No  Physical Activity: Not on file  Stress: No Stress Concern Present (07/27/2021)   [place]    Feeling of Stress : Not at all  Social Connections: Unknown (07/27/2021)   Social Connection and Isolation Panel [NHANES]    Frequency of Communication with Friends and Family: Not on file    Frequency of Social Gatherings with Friends and Family: Not on file    Attends Religious Services: Not on file    Active Member of Clubs or Organizations: Not on file    Attends Archivist Meetings: Not on file    Marital Status: Married  Intimate Partner Violence: Not At Risk (07/27/2021)   Humiliation, Afraid, Rape, and Kick questionnaire    Fear of Current or Ex-Partner: No    Emotionally Abused: No    Physically Abused: No    Sexually Abused: No   Current Meds  Medication Sig   acetaminophen (TYLENOL) 325 MG tablet Take 1-2 tablets (325-650 mg total) by mouth every 6 (six) hours as needed for mild pain (pain score 1-3  or temp > 100.5).   amLODipine (NORVASC) 5 MG tablet TAKE 1 TABLET(5 MG) BY MOUTH EVERY EVENING   bismuth subsalicylate (PEPTO BISMOL) 262 MG/15ML suspension Take 30 mLs by mouth every 6 (six) hours as needed.   cholecalciferol (VITAMIN D3) 25 MCG (1000 UNIT) tablet Take 1,000 Units by mouth daily.   doxycycline (VIBRA-TABS) 100 MG tablet Take 1 tablet (100 mg total) by mouth 2 (two) times daily. With food   hydrocortisone 2.5 % ointment Apply topically 2 (two) times daily. Itching right leg   losartan (COZAAR) 25 MG tablet TAKE 1 TABLET(25 MG) BY MOUTH DAILY   Magnesium Hydroxide (DULCOLAX PO) Take 1 tablet by mouth every other day.   metoprolol succinate (TOPROL-XL) 50 MG 24 hr tablet TAKE 1 TABLET(50 MG) BY MOUTH DAILY WITH OR IMMEDIATELY FOLLOWING A MEAL   mupirocin ointment (BACTROBAN) 2 % Apply 1 Application topically 2 (two) times daily.   polyethylene glycol powder (GLYCOLAX/MIRALAX) 17 GM/SCOOP powder Take 1 Container by mouth daily as needed.   Trospium Chloride 60 MG CP24 Take 1 capsule (60 mg total) by mouth every morning.   vitamin C (ASCORBIC ACID) 500 MG tablet Take 500 mg by mouth daily.   No Known Allergies Recent Results (from the past 2160 hour(s))  Comprehensive metabolic panel     Status: Abnormal   Collection Time: 04/02/22 10:56 AM  Result Value Ref Range   Sodium 140 135 - 145 mEq/L   Potassium 3.6 3.5 - 5.1 mEq/L   Chloride 102 96 - 112 mEq/L   CO2 25 19 - 32 mEq/L   Glucose, Bld 117 (H) 70 - 99 mg/dL   BUN 17 6 - 23 mg/dL   Creatinine, Ser 0.95 0.40 - 1.20 mg/dL   Total Bilirubin 0.4 0.2 - 1.2 mg/dL   Alkaline Phosphatase 88 39 - 117 U/L   AST 14 0 - 37 U/L   ALT 8 0 - 35 U/L   Total Protein 6.9 6.0 - 8.3 g/dL   Albumin 3.9 3.5 - 5.2 g/dL   GFR 56.96 (L) >60.00 mL/min    Comment: Calculated using the CKD-EPI Creatinine Equation (2021)   Calcium 9.7 8.4 - 10.5 mg/dL  CBC with Differential/Platelet     Status: None   Collection Time: 04/02/22 10:56 AM   Result Value Ref Range   WBC 9.0 4.0 - 10.5 K/uL   RBC 4.55 3.87 - 5.11 Mil/uL  Hemoglobin 12.4 12.0 - 15.0 g/dL   HCT 40.3 36.0 - 46.0 %   MCV 88.5 78.0 - 100.0 fl   MCHC 30.8 30.0 - 36.0 g/dL   RDW 15.4 11.5 - 15.5 %   Platelets 303.0 150.0 - 400.0 K/uL   Neutrophils Relative % 51.0 43.0 - 77.0 %   Lymphocytes Relative 38.3 12.0 - 46.0 %   Monocytes Relative 10.0 3.0 - 12.0 %   Eosinophils Relative 0.3 0.0 - 5.0 %   Basophils Relative 0.4 0.0 - 3.0 %   Neutro Abs 4.6 1.4 - 7.7 K/uL   Lymphs Abs 3.4 0.7 - 4.0 K/uL   Monocytes Absolute 0.9 0.1 - 1.0 K/uL   Eosinophils Absolute 0.0 0.0 - 0.7 K/uL   Basophils Absolute 0.0 0.0 - 0.1 K/uL  CEA     Status: None   Collection Time: 05/03/22  9:46 AM  Result Value Ref Range   CEA 2.0 0.0 - 4.7 ng/mL    Comment: (NOTE)                             Nonsmokers          <3.9                             Smokers             <5.6 Roche [employer]ostics Electrochemiluminescence Immunoassay (ECLIA) Values obtained with different assay methods or kits cannot be used interchangeably.  Results cannot be interpreted as absolute evidence of the presence or absence of malignant disease. Performed At: [facility] [place], [place] [id number] Rush Farmer MD [id number]   CBC with Differential/Platelet     Status: None   Collection Time: 05/03/22  9:46 AM  Result Value Ref Range   WBC 8.5 4.0 - 10.5 K/uL   RBC 4.75 3.87 - 5.11 MIL/uL   Hemoglobin 13.2 12.0 - 15.0 g/dL   HCT 40.7 36.0 - 46.0 %   MCV 85.7 80.0 - 100.0 fL   MCH 27.8 26.0 - 34.0 pg   MCHC 32.4 30.0 - 36.0 g/dL   RDW 14.8 11.5 - 15.5 %   Platelets 331 150 - 400 K/uL   nRBC 0.0 0.0 - 0.2 %   Neutrophils Relative % 53 %   Neutro Abs 4.5 1.7 - 7.7 K/uL   Lymphocytes Relative 37 %   Lymphs Abs 3.1 0.7 - 4.0 K/uL   Monocytes Relative 9 %   Monocytes Absolute 0.8 0.1 - 1.0 K/uL   Eosinophils Relative 0 %   Eosinophils Absolute 0.0 0.0 - 0.5 K/uL    Basophils Relative 1 %   Basophils Absolute 0.0 0.0 - 0.1 K/uL   Immature Granulocytes 0 %   Abs Immature Granulocytes 0.03 0.00 - 0.07 K/uL    Comment: Performed at [facility], [address] [place], [place] [address]  Comprehensive metabolic panel     Status: Abnormal   Collection Time: 05/03/22  9:46 AM  Result Value Ref Range   Sodium 138 135 - 145 mmol/L   Potassium 4.0 3.5 - 5.1 mmol/L   Chloride 106 98 - 111 mmol/L   CO2 25 22 - 32 mmol/L   Glucose, Bld 113 (H) 70 - 99 mg/dL    Comment: Glucose reference range applies only to samples taken after fasting for at least 8 hours.   BUN  19 8 - 23 mg/dL   Creatinine, Ser 0.93 0.44 - 1.00 mg/dL   Calcium 9.6 8.9 - 10.3 mg/dL   Total Protein 7.9 6.5 - 8.1 g/dL   Albumin 4.0 3.5 - 5.0 g/dL   AST 20 15 - 41 U/L   ALT 11 0 - 44 U/L   Alkaline Phosphatase 94 38 - 126 U/L   Total Bilirubin 0.6 0.3 - 1.2 mg/dL   GFR, Estimated >60 >60 mL/min    Comment: (NOTE) Calculated using the CKD-EPI Creatinine Equation (2021)    Anion gap 7 5 - 15    Comment: Performed at [facility], [address] [place],  [address]   Objective  Body mass index is 26.47 kg/m. Wt Readings from Last 3 Encounters:  05/16/22 149 lb 6.4 oz (67.8 kg)  05/03/22 149 lb 14.4 oz (68 kg)  04/02/22 152 lb 9.6 oz (69.2 kg)   Temp Readings from Last 3 Encounters:  05/16/22 98.2 F (36.8 C) (Oral)  05/03/22 (!) 96.6 F (35.9 C)  04/02/22 98.3 F (36.8 C) (Oral)   BP Readings from Last 3 Encounters:  05/16/22 126/60  05/03/22 (!) 145/67  04/02/22 124/80   Pulse Readings from Last 3 Encounters:  05/16/22 (!) 58  05/03/22 75  04/02/22 83    Physical Exam Vitals and nursing note reviewed.  Constitutional:      Appearance: Normal appearance. She is well-developed and well-groomed.  HENT:     Head: Normocephalic and atraumatic.  Eyes:     Conjunctiva/sclera: Conjunctivae normal.     Pupils: Pupils are equal, round, and reactive  to light.  Cardiovascular:     Rate and Rhythm: Normal rate and regular rhythm.     Heart sounds: Normal heart sounds. No murmur heard. Pulmonary:     Effort: Pulmonary effort is normal.     Breath sounds: Normal breath sounds.  Abdominal:     General: Abdomen is flat. Bowel sounds are normal.     Tenderness: There is no abdominal tenderness.  Musculoskeletal:        General: No tenderness.  Skin:    General: Skin is warm and dry.       Neurological:     General: No focal deficit present.     Mental Status: She is alert and oriented to person, place, and time. Mental status is at baseline.     Cranial Nerves: Cranial nerves 2-12 are intact.     Motor: Motor function is intact.     Coordination: Coordination is intact.     Gait: Gait is intact.  Psychiatric:        Attention and Perception: Attention and perception normal.        Mood and Affect: Mood and affect normal.        Speech: Speech normal.        Behavior: Behavior normal. Behavior is cooperative.        Thought Content: Thought content normal.        Cognition and Memory: Cognition and memory normal.        Judgment: Judgment normal.     Assessment  Plan  Cellulitis of right lower extremity right knee lateral to TKR surgical scar which she had done 12/2021 - Plan: mupirocin ointment (BACTROBAN) 2 %, doxycycline (VIBRA-TABS) 100 MG tablet Dermatitis - Plan: hydrocortisone 2.5 % ointment  Dove antibacterial soap   Provider: Dr. Olivia Mackie McLean-Scocuzza-Internal Medicine

## 2022-05-30 ENCOUNTER — Encounter: Payer: Self-pay | Admitting: Gastroenterology

## 2022-05-31 ENCOUNTER — Ambulatory Visit: Payer: Medicare Other | Admitting: Anesthesiology

## 2022-05-31 ENCOUNTER — Ambulatory Visit
Admission: RE | Admit: 2022-05-31 | Discharge: 2022-05-31 | Disposition: A | Discharge status: Home / Self Care | Payer: Medicare Other | Attending: Gastroenterology | Admitting: Gastroenterology

## 2022-05-31 ENCOUNTER — Encounter
Admission: RE | Disposition: A | Discharge status: Home / Self Care | Payer: Self-pay | Source: Home / Self Care | Attending: Gastroenterology

## 2022-05-31 ENCOUNTER — Encounter: Payer: Self-pay | Admitting: Gastroenterology

## 2022-05-31 DIAGNOSIS — K639 Disease of intestine, unspecified: Secondary | ICD-10-CM | POA: Diagnosis not present

## 2022-05-31 DIAGNOSIS — D122 Benign neoplasm of ascending colon: Secondary | ICD-10-CM | POA: Insufficient documentation

## 2022-05-31 DIAGNOSIS — N1831 Chronic kidney disease, stage 3a: Secondary | ICD-10-CM | POA: Insufficient documentation

## 2022-05-31 DIAGNOSIS — K635 Polyp of colon: Secondary | ICD-10-CM | POA: Diagnosis not present

## 2022-05-31 DIAGNOSIS — D124 Benign neoplasm of descending colon: Secondary | ICD-10-CM | POA: Diagnosis not present

## 2022-05-31 DIAGNOSIS — K5730 Diverticulosis of large intestine without perforation or abscess without bleeding: Secondary | ICD-10-CM | POA: Diagnosis not present

## 2022-05-31 DIAGNOSIS — I129 Hypertensive chronic kidney disease with stage 1 through stage 4 chronic kidney disease, or unspecified chronic kidney disease: Secondary | ICD-10-CM | POA: Insufficient documentation

## 2022-05-31 DIAGNOSIS — K644 Residual hemorrhoidal skin tags: Secondary | ICD-10-CM | POA: Diagnosis not present

## 2022-05-31 DIAGNOSIS — R933 Abnormal findings on diagnostic imaging of other parts of digestive tract: Secondary | ICD-10-CM | POA: Insufficient documentation

## 2022-05-31 HISTORY — PX: COLONOSCOPY WITH PROPOFOL: SHX5780

## 2022-05-31 SURGERY — COLONOSCOPY WITH PROPOFOL
Anesthesia: General

## 2022-05-31 MED ORDER — PROPOFOL 500 MG/50ML IV EMUL
INTRAVENOUS | Status: DC | PRN
  Administered 2022-05-31: 140 ug/kg/min via INTRAVENOUS

## 2022-05-31 MED ORDER — PROPOFOL 10 MG/ML IV BOLUS
INTRAVENOUS | Status: DC | PRN
  Administered 2022-05-31: 80 mg via INTRAVENOUS

## 2022-05-31 MED ORDER — SODIUM CHLORIDE 0.9 % IV SOLN: INTRAVENOUS | Status: DC

## 2022-05-31 MED ORDER — LIDOCAINE HCL (CARDIAC) PF 100 MG/5ML IV SOSY
PREFILLED_SYRINGE | INTRAVENOUS | Status: DC | PRN
  Administered 2022-05-31: 50 mg via INTRAVENOUS

## 2022-05-31 NOTE — Transfer of Care (Signed)
Immediate Anesthesia Transfer of Care Note  Patient: Donna Ferguson  Procedure(s) Performed: COLONOSCOPY WITH PROPOFOL  Patient Location: PACU  Anesthesia Type:General  Level of Consciousness: sedated  Airway & Oxygen Therapy: Patient Spontanous Breathing  Post-op Assessment: Report given to RN and Post -op Vital signs reviewed and stable  Post vital signs: Reviewed and stable  Last Vitals:  Vitals Value Taken Time  BP 89/47 05/31/22 0913  Temp    Pulse 76 05/31/22 0913  Resp 13 05/31/22 0913  SpO2 100 % 05/31/22 0913    Last Pain:  Vitals:   05/31/22 0733  TempSrc: Temporal         Complications: No notable events documented.

## 2022-05-31 NOTE — Anesthesia Preprocedure Evaluation (Addendum)
Anesthesia Evaluation  Patient identified by MRN, date of birth, ID band Patient awake    Reviewed: Allergy & Precautions, NPO status , Patient's Chart, lab work & pertinent test results  History of Anesthesia Complications Negative for: history of anesthetic complications  Airway Mallampati: III  TM Distance: >3 FB Neck ROM: Full    Dental no notable dental hx. (+) Teeth Intact, Implants   Pulmonary neg pulmonary ROS, neg sleep apnea, neg COPD, Patient abstained from smoking.Not current smoker,    Pulmonary exam normal breath sounds clear to auscultation       Cardiovascular Exercise Tolerance: Good METShypertension, Pt. on medications (-) CAD and (-) Past MI (-) dysrhythmias  Rhythm:Regular Rate:Normal - Systolic murmurs 1. Left ventricular ejection fraction, by estimation, is 60 to 65%. The  left ventricle has normal function. The left ventricle has no regional  wall motion abnormalities. There is mild left ventricular hypertrophy.  Left ventricular diastolic parameters  are consistent with Grade I diastolic dysfunction (impaired relaxation).  2. Right ventricular systolic function is normal. The right ventricular  size is normal. There is normal pulmonary artery systolic pressure.  3. The mitral valve is normal in structure. Trivial mitral valve  regurgitation. No evidence of mitral stenosis.  4. The aortic valve is normal in structure. Aortic valve regurgitation is  not visualized. Mild to moderate aortic valve sclerosis/calcification is  present, without any evidence of aortic stenosis.  5. The inferior vena cava is normal in size with greater than 50%  respiratory variability, suggesting right atrial pressure of 3 mmHg.    Neuro/Psych negative neurological ROS  negative psych ROS   GI/Hepatic neg GERD  ,(+)     (-) substance abuse  ,   Endo/Other  neg diabetes  Renal/GU CRFRenal disease      Musculoskeletal  (+) Arthritis ,   Abdominal Normal abdominal exam  (+)   Peds  Hematology   Anesthesia Other Findings Past Medical History: No date: Arthritis No date: Chronic kidney disease, stage 3a ([place])     Comment:  cyst near kidney No date: Constipation No date: Heart murmur     Comment:  past No date: History of colonoscopy     Comment:  2019 in [place]; patient states no longer screening for colon              cancer. No date: Hypertension No date: IBS (irritable bowel syndrome) No date: Mitral valve insufficiency No date: Pneumonia No date: PONV (postoperative nausea and vomiting) No date: Pre-diabetes No date: Renal cyst No date: Right leg DVT (HCC)  Reproductive/Obstetrics                            Anesthesia Physical  Anesthesia Plan  ASA: 2  Anesthesia Plan: General   Post-op Pain Management:    Induction: Intravenous  PONV Risk Score and Plan: 2 and TIVA and Propofol infusion  Airway Management Planned: Natural Airway  Additional Equipment: None  Intra-op Plan:   Post-operative Plan:   Informed Consent: I have reviewed the patients History and Physical, chart, labs and discussed the procedure including the risks, benefits and alternatives for the proposed anesthesia with the patient or authorized representative who has indicated his/her understanding and acceptance.     Dental advisory given  Plan Discussed with: CRNA and Surgeon  Anesthesia Plan Comments:        Anesthesia Quick Evaluation

## 2022-05-31 NOTE — H&P (Signed)
Cephas Darby, MD [address]  [place]  [place], [place] [address]  Main: [phone]  Fax: [phone] Pager: [phone]  Primary Care Physician:  Burnard Hawthorne, FNP Primary Gastroenterologist:  Dr. Cephas Darby  Pre-Procedure History & Physical: HPI:  Donna Ferguson is a 81 y.o. female is here for an colonoscopy.   Past Medical History:  Diagnosis Date   Arthritis    Chronic kidney disease, stage 3a ([place])    cyst near kidney   Constipation    Heart murmur    past   History of colonoscopy    2019 in [place]; patient states no longer screening for colon cancer.   Hypertension    IBS (irritable bowel syndrome)    Mitral valve insufficiency    Pneumonia    PONV (postoperative nausea and vomiting)    Pre-diabetes    Renal cyst    Right leg DVT (HCC)     Past Surgical History:  Procedure Laterality Date   ABDOMINAL HYSTERECTOMY     unsure if has ovaries.    BREAST EXCISIONAL BIOPSY Bilateral late 80s, early 90s    benign   BREAST SURGERY     COLONOSCOPY     cyst N/A    cyst removal both breast   JOINT REPLACEMENT     REPLACEMENT TOTAL KNEE Left 2017   TOTAL KNEE ARTHROPLASTY Right 12/28/2021   Procedure: TOTAL KNEE ARTHROPLASTY;  Surgeon: Corky Mull, MD;  Location: [facility] ORS;  Service: Orthopedics;  Laterality: Right;    Prior to Admission medications   Medication Sig Start Date End Date Taking? Authorizing Provider  amLODipine (NORVASC) 5 MG tablet TAKE 1 TABLET(5 MG) BY MOUTH EVERY EVENING 12/07/21  Yes Arnett, Yvetta Coder, FNP  famotidine (PEPCID) 20 MG tablet Take 1 tablet (20 mg total) by mouth at bedtime. 04/02/22  Yes Burnard Hawthorne, FNP  losartan (COZAAR) 25 MG tablet TAKE 1 TABLET(25 MG) BY MOUTH DAILY 12/06/21  Yes Burnard Hawthorne, FNP  metoprolol succinate (TOPROL-XL) 50 MG 24 hr tablet TAKE 1 TABLET(50 MG) BY MOUTH DAILY WITH OR IMMEDIATELY FOLLOWING A MEAL 12/07/21  Yes Burnard Hawthorne, FNP  Trospium Chloride 60 MG CP24 Take 1  capsule (60 mg total) by mouth every morning. 02/19/22  Yes Arnett, Yvetta Coder, FNP  vitamin C (ASCORBIC ACID) 500 MG tablet Take 500 mg by mouth daily.   Yes [provider]  acetaminophen (TYLENOL) 325 MG tablet Take 1-2 tablets (325-650 mg total) by mouth every 6 (six) hours as needed for mild pain (pain score 1-3 or temp > 100.5). 12/29/21   Lattie Corns, PA-C  bismuth subsalicylate (PEPTO BISMOL) 262 MG/15ML suspension Take 30 mLs by mouth every 6 (six) hours as needed.    [provider]  cholecalciferol (VITAMIN D3) 25 MCG (1000 UNIT) tablet Take 1,000 Units by mouth daily.    [provider]  doxycycline (VIBRA-TABS) 100 MG tablet Take 1 tablet (100 mg total) by mouth 2 (two) times daily. With food 05/16/22   McLean-Scocuzza, Nino Glow, MD  hydrocortisone 2.5 % ointment Apply topically 2 (two) times daily. Itching right leg 05/16/22   McLean-Scocuzza, Nino Glow, MD  Magnesium Hydroxide (DULCOLAX PO) Take 1 tablet by mouth every other day.    [provider]  mupirocin ointment (BACTROBAN) 2 % Apply 1 Application topically 2 (two) times daily. 05/16/22   McLean-Scocuzza, Nino Glow, MD  oxyCODONE (OXY IR/ROXICODONE) 5 MG immediate release tablet Take 1-2 tablets (5-10  mg total) by mouth every 4 (four) hours as needed for moderate pain (pain score 4-6). Patient not taking: Reported on 05/03/2022 12/29/21   Lattie Corns, PA-C  polyethylene glycol powder [facility]) 17 GM/SCOOP powder Take 1 Container by mouth daily as needed.    [provider]    Allergies as of 05/03/2022   (No Known Allergies)    Family History  Problem Relation Age of Onset   Hypertension Mother    Diabetes Father    Hypertension Father    Heart disease Father    Hypertension Sister    Throat cancer Sister    Hypertension Daughter    Breast cancer Neg Hx     Social History   Socioeconomic History   Marital status: Married    Spouse name: Roosevelt   Number  of children: 2   Years of education: Not on file   Highest education level: Not on file  Occupational History   Not on file  Tobacco Use   Smoking status: Never    Passive exposure: Past   Smokeless tobacco: Never  Vaping Use   Vaping Use: Never used  Substance and Sexual Activity   Alcohol use: Never   Drug use: Never   Sexual activity: Yes    Birth control/protection: Surgical  Other Topics Concern   Not on file  Social History Narrative   [employer] in [place]- Retired   2 children son & daughter   Married   From [place] [address] [place]   Retired 2011.    Enjoys painting   Social Determinants of Health   Financial Resource Strain: Low Risk  (07/27/2021)   Overall Financial Resource Strain (CARDIA)    Difficulty of Paying Living Expenses: Not hard at all  Food Insecurity: No Food Insecurity (07/27/2021)   Hunger Vital Sign    Worried About Running Out of Food in the Last Year: Never true    Ran Out of Food in the Last Year: Never true  Transportation Needs: No Transportation Needs (07/27/2021)   PRAPARE - Hydrologist (Medical): No    Lack of Transportation (Non-Medical): No  Physical Activity: Not on file  Stress: No Stress Concern Present (07/27/2021)   [place]    Feeling of Stress : Not at all  Social Connections: Unknown (07/27/2021)   Social Connection and Isolation Panel [NHANES]    Frequency of Communication with Friends and Family: Not on file    Frequency of Social Gatherings with Friends and Family: Not on file    Attends Religious Services: Not on file    Active Member of Clubs or Organizations: Not on file    Attends Archivist Meetings: Not on file    Marital Status: Married  Intimate Partner Violence: Not At Risk (07/27/2021)   Humiliation, Afraid, Rape, and Kick questionnaire    Fear of Current or Ex-Partner:  No    Emotionally Abused: No    Physically Abused: No    Sexually Abused: No    Review of Systems: See HPI, otherwise negative ROS  Physical Exam: BP 136/72   Pulse 92   Temp (!) 96.5 F (35.8 C) (Temporal)   Resp 18   Ht '5\' 3"'$  (1.6 m)   Wt 67.1 kg   SpO2 100%   BMI 26.22 kg/m  General:   Alert,  pleasant and cooperative in NAD Head:  Normocephalic and atraumatic. Neck:  Supple; no masses or thyromegaly. Lungs:  Clear throughout to auscultation.    Heart:  Regular rate and rhythm. Abdomen:  Soft, nontender and nondistended. Normal bowel sounds, without guarding, and without rebound.   Neurologic:  Alert and  oriented x4;  grossly normal neurologically.  Impression/Plan: Donna Ferguson is here for an colonoscopy to be performed for Thickening of the right colon, abnormal CT of the colon  Risks, benefits, limitations, and alternatives regarding  colonoscopy have been reviewed with the patient.  Questions have been answered.  All parties agreeable.   Sherri Sear, MD  05/31/2022, 8:00 AM

## 2022-05-31 NOTE — Op Note (Signed)
[facility] Gastroenterology Patient Name: Donna Ferguson Procedure Date: 05/31/2022 8:40 AM MRN: [id number] Account #: [id number] Date of Birth: Dec 24, 1940 Admit Type: Outpatient Age: 81 Room: [facility] ENDO ROOM 3 Gender: Female Note Status: Finalized Instrument Name: Peds Colonoscope [id number] Procedure:             Colonoscopy Indications:           Abnormal CT of the GI tract Providers:             Lin Landsman MD, MD Referring MD:          Yvetta Coder. Arnett (Referring MD) Medicines:             General Anesthesia Complications:         No immediate complications. Estimated blood loss: None. Procedure:             Pre-Anesthesia Assessment:                        - Prior to the procedure, a History and Physical was                         performed, and patient medications and allergies were                         reviewed. The patient is competent. The risks and                         benefits of the procedure and the sedation options and                         risks were discussed with the patient. All questions                         were answered and informed consent was obtained.                         Patient identification and proposed procedure were                         verified by the physician, the nurse, the                         anesthesiologist, the anesthetist and the technician                         in the pre-procedure area in the procedure room in the                         endoscopy suite. Mental Status Examination: alert and                         oriented. Airway Examination: normal oropharyngeal                         airway and neck mobility. Respiratory Examination:                         clear to auscultation. CV Examination: normal.  Prophylactic Antibiotics: The patient does not require                         prophylactic antibiotics. Prior Anticoagulants: The                         patient  has taken no previous anticoagulant or                         antiplatelet agents. ASA Grade Assessment: II - A                         patient with mild systemic disease. After reviewing                         the risks and benefits, the patient was deemed in                         satisfactory condition to undergo the procedure. The                         anesthesia plan was to use general anesthesia.                         Immediately prior to administration of medications,                         the patient was re-assessed for adequacy to receive                         sedatives. The heart rate, respiratory rate, oxygen                         saturations, blood pressure, adequacy of pulmonary                         ventilation, and response to care were monitored                         throughout the procedure. The physical status of the                         patient was re-assessed after the procedure.                        After obtaining informed consent, the colonoscope was                         passed under direct vision. Throughout the procedure,                         the patient's blood pressure, pulse, and oxygen                         saturations were monitored continuously. The                         Colonoscope was introduced through the anus and  advanced to the the terminal ileum, with                         identification of the appendiceal orifice and IC                         valve. The colonoscopy was performed without                         difficulty. The patient tolerated the procedure well.                         The quality of the bowel preparation was evaluated                         using the BBPS [facility] Bowel Preparation Scale) with                         scores of: Right Colon = 3, Transverse Colon = 3 and                         Left Colon = 3 (entire mucosa seen well with no                         residual  staining, small fragments of stool or opaque                         liquid). The total BBPS score equals 9. Findings:      The perianal and digital rectal examinations were normal. Pertinent       negatives include normal sphincter tone and no palpable rectal lesions.      The terminal ileum appeared normal.      Two sessile polyps were found in the descending colon and ascending       colon. The polyps were 4 to 5 mm in size. These polyps were removed with       a cold snare. Resection and retrieval were complete.      Two sessile polyps were found in the descending colon and ascending       colon. The polyps were diminutive in size. These polyps were removed       with a cold biopsy forceps. Resection and retrieval were complete.       Estimated blood loss: none.      Non-bleeding external hemorrhoids were found during retroflexion. The       hemorrhoids were large.      Many diverticula were found in the recto-sigmoid colon and sigmoid colon. Impression:            - The examined portion of the ileum was normal.                        - Two 4 to 5 mm polyps in the descending colon and in                         the ascending colon, removed with a cold snare.                         Resected and  retrieved.                        - Two diminutive polyps in the descending colon and in                         the ascending colon, removed with a cold biopsy                         forceps. Resected and retrieved.                        - Non-bleeding external hemorrhoids.                        - Diverticulosis in the recto-sigmoid colon and in the                         sigmoid colon. Recommendation:        - Discharge patient to home (with escort).                        - Resume previous diet today.                        - Continue present medications.                        - Await pathology results.                        - Repeat colonoscopy in 3 - 5 years for surveillance                          based on pathology results. Procedure Code(s):     --- Professional ---                        [phone], Colonoscopy, flexible; with removal of                         tumor(s), polyp(s), or other lesion(s) by snare                         technique                        45380, 61, Colonoscopy, flexible; with biopsy, single                         or multiple Diagnosis Code(s):     --- Professional ---                        K64.4, Residual hemorrhoidal skin tags                        K63.5, Polyp of colon                        K57.30, Diverticulosis of large intestine without  perforation or abscess without bleeding                        R93.3, Abnormal findings on diagnostic imaging of                         other parts of digestive tract CPT copyright 2019 American Medical Association. All rights reserved. The codes documented in this report are preliminary and upon coder review may  be revised to meet current compliance requirements. Dr. Ulyess Mort Lin Landsman MD, MD 05/31/2022 9:10:09 AM This report has been signed electronically. Number of Addenda: 0 Note Initiated On: 05/31/2022 8:40 AM Scope Withdrawal Time: 0 hours 16 minutes 30 seconds  Total Procedure Duration: 0 hours 19 minutes 13 seconds  Estimated Blood Loss:  Estimated blood loss: none.      [facility]

## 2022-05-31 NOTE — Anesthesia Postprocedure Evaluation (Signed)
Anesthesia Post Note  Patient: Donna Ferguson  Procedure(s) Performed: COLONOSCOPY WITH PROPOFOL  Patient location during evaluation: Endoscopy Anesthesia Type: General Level of consciousness: awake and alert Pain management: pain level controlled Vital Signs Assessment: post-procedure vital signs reviewed and stable Respiratory status: spontaneous breathing, nonlabored ventilation and respiratory function stable Cardiovascular status: blood pressure returned to baseline and stable Postop Assessment: no apparent nausea or vomiting Anesthetic complications: no   No notable events documented.   Last Vitals:  Vitals:   05/31/22 0924 05/31/22 0934  BP:    Pulse: 77 77  Resp:    Temp: (!) 35.6 C   SpO2:      Last Pain:  Vitals:   05/31/22 0924  TempSrc: Temporal                 [place]

## 2022-06-01 ENCOUNTER — Encounter: Payer: Self-pay | Admitting: Gastroenterology

## 2022-06-01 LAB — SURGICAL PATHOLOGY

## 2022-06-04 ENCOUNTER — Encounter: Payer: Self-pay | Admitting: Gastroenterology

## 2022-06-05 ENCOUNTER — Other Ambulatory Visit: Payer: Self-pay | Admitting: Family

## 2022-06-07 ENCOUNTER — Inpatient Hospital Stay: Payer: Medicare Other | Attending: Oncology | Admitting: Oncology

## 2022-06-07 ENCOUNTER — Encounter: Payer: Self-pay | Admitting: Oncology

## 2022-06-07 VITALS — BP 122/61 | HR 72 | Temp 96.9°F | Resp 18 | Wt 150.0 lb

## 2022-06-07 DIAGNOSIS — R591 Generalized enlarged lymph nodes: Secondary | ICD-10-CM

## 2022-06-07 DIAGNOSIS — N1831 Chronic kidney disease, stage 3a: Secondary | ICD-10-CM | POA: Diagnosis not present

## 2022-06-07 DIAGNOSIS — Z86718 Personal history of other venous thrombosis and embolism: Secondary | ICD-10-CM | POA: Diagnosis not present

## 2022-06-07 DIAGNOSIS — I129 Hypertensive chronic kidney disease with stage 1 through stage 4 chronic kidney disease, or unspecified chronic kidney disease: Secondary | ICD-10-CM | POA: Insufficient documentation

## 2022-06-07 DIAGNOSIS — R948 Abnormal results of function studies of other organs and systems: Secondary | ICD-10-CM | POA: Insufficient documentation

## 2022-06-07 DIAGNOSIS — Z800 Family history of malignant neoplasm of digestive organs: Secondary | ICD-10-CM | POA: Insufficient documentation

## 2022-06-07 DIAGNOSIS — Z90710 Acquired absence of both cervix and uterus: Secondary | ICD-10-CM | POA: Insufficient documentation

## 2022-06-07 DIAGNOSIS — R590 Localized enlarged lymph nodes: Secondary | ICD-10-CM | POA: Diagnosis present

## 2022-06-07 DIAGNOSIS — R933 Abnormal findings on diagnostic imaging of other parts of digestive tract: Secondary | ICD-10-CM | POA: Diagnosis not present

## 2022-06-07 NOTE — Progress Notes (Signed)
Hematology/Oncology Progress note [phone] [phone]           Patient Care Team: Burnard Hawthorne, FNP as PCP - General (Family Medicine) Kate Sable, MD as PCP - Cardiology (Cardiology) Earlie Server, MD as Consulting Physician (Oncology) Clent Jacks, RN as Oncology Nurse Navigator CHIEF COMPLAINTS/REASON FOR VISIT:  lymphadenopathy  HISTORY OF PRESENTING ILLNESS:   Donna Ferguson is a  81 y.o.  female with PMH listed below was seen in consultation at the request of  Burnard Hawthorne, FNP  for evaluation of lymphadenopathy.   Patient was recently seen by primary care provider.  She has experienced left lower quadrant pain.  Patient reports symptoms being triggered after eating fatty food.  She has a history of IBS. 11/17/2021, CT abdomen pelvis without contrast was obtained for further evaluation.  CT showed no acute abnormality of the abdomen or pelvis.  Scattered sigmoid colonic diverticulosis without findings of acute diverticulitis.  Nonspecific prominent/mildly enlarged lymph nodes along the mesenteric root measuring up to 13 mm in short axis previously measuring up to 9 mm.  Constipation.    INTERVAL HISTORY Donna Ferguson is a 81 y.o. female who has above history reviewed by me today presents for follow up visit for mesenteric lymphadenopathy. Patient has chronic constipation.  She reports history of IBS Denies any unintentional weight loss, abdominal pain, or blood in the stool. 05/31/2022, colonoscopy showed multiple polyps resected.  Tubular adenoma.  No malignancy. Patient present to discuss further steps.  No new complaints.  Review of Systems  Constitutional:  Negative for appetite change, chills, fatigue and fever.  HENT:   Negative for hearing loss and voice change.   Eyes:  Negative for eye problems.  Respiratory:  Negative for chest tightness and cough.   Cardiovascular:  Negative for chest pain.  Gastrointestinal:   Positive for constipation. Negative for abdominal distention, abdominal pain and blood in stool.       Chronic IBS symptoms  Endocrine: Negative for hot flashes.  Genitourinary:  Negative for difficulty urinating and frequency.   Musculoskeletal:  Negative for arthralgias.  Skin:  Negative for itching and rash.  Neurological:  Negative for extremity weakness.  Hematological:  Negative for adenopathy.  Psychiatric/Behavioral:  Negative for confusion.     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   Hypertension    IBS (irritable bowel syndrome)    Mitral valve insufficiency    Pneumonia    PONV (postoperative nausea and vomiting)    Pre-diabetes    Renal cyst    Right leg DVT ([place])     SURGICAL HISTORY: Past Surgical History:  Procedure Laterality Date   ABDOMINAL HYSTERECTOMY     unsure if has ovaries.    BREAST EXCISIONAL BIOPSY Bilateral late 80s, early 90s    benign   BREAST SURGERY     COLONOSCOPY     COLONOSCOPY WITH PROPOFOL N/A 05/31/2022   Procedure: COLONOSCOPY WITH PROPOFOL;  Surgeon: Lin Landsman, MD;  Location[facility] ENDOSCOPY;  Service: Gastroenterology;  Laterality: N/A;   cyst N/A    cyst removal both breast   JOINT REPLACEMENT     REPLACEMENT TOTAL KNEE Left 2017  TOTAL KNEE ARTHROPLASTY Right 12/28/2021   Procedure: TOTAL KNEE ARTHROPLASTY;  Surgeon: Corky Mull, MD;  Location: [facility] ORS;  Service: Orthopedics;  Laterality: Right;    SOCIAL HISTORY: Social History   Socioeconomic History   Marital status: Married    Spouse name: Roosevelt   Number of children: 2   Years of education: Not on file   Highest education level: Not on file  Occupational History   Not on file  Tobacco Use   Smoking status: Never     Passive exposure: Past   Smokeless tobacco: Never  Vaping Use   Vaping Use: Never used  Substance and Sexual Activity   Alcohol use: Never   Drug use: Never   Sexual activity: Yes    Birth control/protection: Surgical  Other Topics Concern   Not on file  Social History Narrative   [place] in [place]- Retired   2 children son & daughter   Married   From [place] by [address] of [place]   Retired 2011.    Enjoys painting   Social Determinants of Health   Financial Resource Strain: Low Risk  (07/27/2021)   Overall Financial Resource Strain (CARDIA)    Difficulty of Paying Living Expenses: Not hard at all  Food Insecurity: No Food Insecurity (07/27/2021)   Hunger Vital Sign    Worried About Running Out of Food in the Last Year: Never true    Ran Out of Food in the Last Year: Never true  Transportation Needs: No Transportation Needs (07/27/2021)   PRAPARE - Hydrologist (Medical): No    Lack of Transportation (Non-Medical): No  Physical Activity: Not on file  Stress: No Stress Concern Present (07/27/2021)   [place]    Feeling of Stress : Not at all  Social Connections: Unknown (07/27/2021)   Social Connection and Isolation Panel [NHANES]    Frequency of Communication with Friends and Family: Not on file    Frequency of Social Gatherings with Friends and Family: Not on file    Attends Religious Services: Not on file    Active Member of Clubs or Organizations: Not on file    Attends Archivist Meetings: Not on file    Marital Status: Married  Intimate Partner Violence: Not At Risk (07/27/2021)   Humiliation, Afraid, Rape, and Kick questionnaire    Fear of Current or Ex-Partner: No    Emotionally Abused: No    Physically Abused: No    Sexually Abused: No    FAMILY HISTORY: Family History  Problem Relation Age of Onset   Hypertension  Mother    Diabetes Father    Hypertension Father    Heart disease Father    Hypertension Sister    Throat cancer Sister    Hypertension Daughter    Breast cancer Neg Hx     ALLERGIES:  has No Known Allergies.  MEDICATIONS:  Current Outpatient Medications  Medication Sig Dispense Refill   amLODipine (NORVASC) 5 MG tablet TAKE 1 TABLET(5 MG) BY MOUTH EVERY EVENING 90 tablet 1   cholecalciferol (VITAMIN D3) 25 MCG (1000 UNIT) tablet Take 1,000 Units by mouth daily.     famotidine (PEPCID) 20 MG tablet Take 1 tablet (20 mg total) by mouth at bedtime. 30 tablet 2   losartan (COZAAR) 25 MG tablet TAKE 1 TABLET(25 MG) BY MOUTH DAILY 90 tablet 1   metoprolol succinate (TOPROL-XL) 50  MG 24 hr tablet TAKE 1 TABLET(50 MG) BY MOUTH DAILY WITH OR IMMEDIATELY FOLLOWING A MEAL 90 tablet 1   Trospium Chloride 60 MG CP24 Take 1 capsule (60 mg total) by mouth every morning. 90 capsule 1   vitamin C (ASCORBIC ACID) 500 MG tablet Take 500 mg by mouth daily.     acetaminophen (TYLENOL) 325 MG tablet Take 1-2 tablets (325-650 mg total) by mouth every 6 (six) hours as needed for mild pain (pain score 1-3 or temp > 100.5). (Patient not taking: Reported on 06/07/2022) 60 tablet 0   Magnesium Hydroxide (DULCOLAX PO) Take 1 tablet by mouth every other day. (Patient not taking: Reported on 06/07/2022)     mupirocin ointment (BACTROBAN) 2 % Apply 1 Application topically 2 (two) times daily. (Patient not taking: Reported on 06/07/2022) 30 g 0   polyethylene glycol powder (GLYCOLAX/MIRALAX) 17 GM/SCOOP powder Take 1 Container by mouth daily as needed. (Patient not taking: Reported on 06/07/2022)     No current facility-administered medications for this visit.     PHYSICAL EXAMINATION: ECOG PERFORMANCE STATUS: 0 - Asymptomatic Vitals:   06/07/22 1004  BP: 122/61  Pulse: 72  Resp: 18  Temp: (!) 96.9 F (36.1 C)   Filed Weights   06/07/22 1004  Weight: 150 lb (68 kg)    Physical Exam Constitutional:       General: She is not in acute distress. HENT:     Head: Normocephalic and atraumatic.  Eyes:     General: No scleral icterus. Cardiovascular:     Rate and Rhythm: Normal rate and regular rhythm.     Heart sounds: Normal heart sounds.  Pulmonary:     Effort: Pulmonary effort is normal. No respiratory distress.     Breath sounds: No wheezing.  Abdominal:     General: Bowel sounds are normal. There is no distension.     Palpations: Abdomen is soft.  Musculoskeletal:        General: No deformity. Normal range of motion.     Cervical back: Normal range of motion and neck supple.  Skin:    General: Skin is warm and dry.     Findings: No erythema or rash.  Neurological:     Mental Status: She is alert and oriented to person, place, and time. Mental status is at baseline.     Cranial Nerves: No cranial nerve deficit.     Coordination: Coordination normal.  Psychiatric:        Mood and Affect: Mood normal.     LABORATORY DATA:  I have reviewed the data as listed    Latest Ref Rng & Units 05/03/2022    9:46 AM 04/02/2022   10:56 AM 12/29/2021    5:30 AM  CBC  WBC 4.0 - 10.5 K/uL 8.5  9.0  12.5   Hemoglobin 12.0 - 15.0 g/dL 13.2  12.4  12.2   Hematocrit 36.0 - 46.0 % 40.7  40.3  37.4   Platelets 150 - 400 K/uL 331  303.0  246       Latest Ref Rng & Units 05/03/2022    9:46 AM 04/02/2022   10:56 AM 12/29/2021    5:30 AM  CMP  Glucose 70 - 99 mg/dL 113  117  142   BUN 8 - 23 mg/dL '19  17  22   '$ Creatinine 0.44 - 1.00 mg/dL 0.93  0.95  1.21   Sodium 135 - 145 mmol/L 138  140  137   Potassium  3.5 - 5.1 mmol/L 4.0  3.6  4.1   Chloride 98 - 111 mmol/L 106  102  103   CO2 22 - 32 mmol/L '25  25  26   '$ Calcium 8.9 - 10.3 mg/dL 9.6  9.7  9.3   Total Protein 6.5 - 8.1 g/dL 7.9  6.9    Total Bilirubin 0.3 - 1.2 mg/dL 0.6  0.4    Alkaline Phos 38 - 126 U/L 94  88    AST 15 - 41 U/L 20  14    ALT 0 - 44 U/L 11  8        RADIOGRAPHIC STUDIES: I have personally reviewed the  radiological images as listed and agreed with the findings in the report. No results found.    ASSESSMENT & PLAN:  1. Lymphadenopathy   2. Abnormal CT scan, colon    #Abnormal CT scan: right side circumferential thickening with increased mesenteric lymphadenopathy,  CEA normal. Colonoscopy negative.  PET scan evaluation to see if mesenteric lymphadenopathy has increased metabolism.  Discussed about possibility of proceeding with biopsy if hypermetabolic.  She agrees with the plan.  Orders Placed This Encounter  Procedures   NM PET Image Initial (PI) Skull Base To Thigh    Standing Status:   Future    Standing Expiration Date:   06/08/2023    Order Specific Question:   If indicated for the ordered procedure, I authorize the administration of a radiopharmaceutical per Radiology protocol    Answer:   Yes    Order Specific Question:   Preferred imaging location?    Answer:   [facility]    All questions were answered. The patient knows to call the clinic with any problems questions or concerns.  cc Burnard Hawthorne, FNP    Return of visit: To be determined.  Earlie Server, MD, PhD [facility] Hematology Oncology 06/07/2022

## 2022-06-18 ENCOUNTER — Ambulatory Visit
Admission: RE | Admit: 2022-06-18 | Discharge: 2022-06-18 | Disposition: A | Discharge status: Home / Self Care | Payer: Medicare Other | Source: Ambulatory Visit | Attending: Oncology | Admitting: Oncology

## 2022-06-18 DIAGNOSIS — R933 Abnormal findings on diagnostic imaging of other parts of digestive tract: Secondary | ICD-10-CM

## 2022-06-18 DIAGNOSIS — I2510 Atherosclerotic heart disease of native coronary artery without angina pectoris: Secondary | ICD-10-CM | POA: Diagnosis not present

## 2022-06-18 DIAGNOSIS — N281 Cyst of kidney, acquired: Secondary | ICD-10-CM | POA: Insufficient documentation

## 2022-06-18 DIAGNOSIS — R590 Localized enlarged lymph nodes: Secondary | ICD-10-CM | POA: Diagnosis present

## 2022-06-18 DIAGNOSIS — R948 Abnormal results of function studies of other organs and systems: Secondary | ICD-10-CM | POA: Insufficient documentation

## 2022-06-18 DIAGNOSIS — R591 Generalized enlarged lymph nodes: Secondary | ICD-10-CM

## 2022-06-18 DIAGNOSIS — I700 Atherosclerosis of aorta: Secondary | ICD-10-CM | POA: Diagnosis not present

## 2022-06-18 DIAGNOSIS — Z96651 Presence of right artificial knee joint: Secondary | ICD-10-CM | POA: Diagnosis not present

## 2022-06-18 LAB — GLUCOSE, CAPILLARY: Glucose-Capillary: 80 mg/dL (ref 70–99)

## 2022-06-18 MED ORDER — FLUDEOXYGLUCOSE F - 18 (FDG) INJECTION
8.2900 | Freq: Once | INTRAVENOUS | Status: AC | PRN
  Administered 2022-06-18: 8.29 via INTRAVENOUS

## 2022-06-24 ENCOUNTER — Other Ambulatory Visit: Payer: Self-pay | Admitting: Oncology

## 2022-06-24 DIAGNOSIS — R591 Generalized enlarged lymph nodes: Secondary | ICD-10-CM

## 2022-06-26 ENCOUNTER — Telehealth: Payer: Self-pay

## 2022-06-26 NOTE — Telephone Encounter (Signed)
-----   Message from Earlie Server, MD sent at 06/24/2022 10:43 AM EST ----- Let her know that the enlarged lymph nodes showed no activity on PET.  I recommend additional blood and urine work up. Please arrange. Labs are ordered.  Recommend her to stop Pepcid, also not any PPI for 2 weeks prior to her lab work .

## 2022-06-26 NOTE — Telephone Encounter (Signed)
Pt informed of results and additional lab recommendation. She states she took Pepcid today, but will stop taking for lab purposes. Pt has been scheduled for labs on 11/22. Pt aware of appt.

## 2022-06-26 NOTE — Telephone Encounter (Signed)
Error

## 2022-07-03 ENCOUNTER — Other Ambulatory Visit: Payer: Self-pay | Admitting: Family

## 2022-07-03 DIAGNOSIS — I10 Essential (primary) hypertension: Secondary | ICD-10-CM

## 2022-07-11 ENCOUNTER — Inpatient Hospital Stay: Payer: Medicare Other | Attending: Oncology

## 2022-07-11 DIAGNOSIS — I129 Hypertensive chronic kidney disease with stage 1 through stage 4 chronic kidney disease, or unspecified chronic kidney disease: Secondary | ICD-10-CM | POA: Insufficient documentation

## 2022-07-11 DIAGNOSIS — Z90710 Acquired absence of both cervix and uterus: Secondary | ICD-10-CM | POA: Insufficient documentation

## 2022-07-11 DIAGNOSIS — N1831 Chronic kidney disease, stage 3a: Secondary | ICD-10-CM | POA: Insufficient documentation

## 2022-07-11 DIAGNOSIS — Z86718 Personal history of other venous thrombosis and embolism: Secondary | ICD-10-CM | POA: Insufficient documentation

## 2022-07-11 DIAGNOSIS — R590 Localized enlarged lymph nodes: Secondary | ICD-10-CM | POA: Insufficient documentation

## 2022-07-11 DIAGNOSIS — Z808 Family history of malignant neoplasm of other organs or systems: Secondary | ICD-10-CM | POA: Diagnosis not present

## 2022-07-11 DIAGNOSIS — R591 Generalized enlarged lymph nodes: Secondary | ICD-10-CM

## 2022-07-11 DIAGNOSIS — K581 Irritable bowel syndrome with constipation: Secondary | ICD-10-CM | POA: Diagnosis not present

## 2022-07-12 LAB — CHROMOGRANIN A: Chromogranin A (ng/mL): 191.5 ng/mL — ABNORMAL HIGH (ref 0.0–101.8)

## 2022-07-16 ENCOUNTER — Telehealth: Payer: Self-pay

## 2022-07-16 NOTE — Telephone Encounter (Signed)
-----   Message from Earlie Server, MD sent at 07/13/2022  9:38 PM EST ----- Please arrange her to follow up to go over results. MD only

## 2022-07-16 NOTE — Telephone Encounter (Signed)
Please schedule pt for MD only- this week or next- to go over PET/ lab results.

## 2022-07-18 ENCOUNTER — Inpatient Hospital Stay (HOSPITAL_BASED_OUTPATIENT_CLINIC_OR_DEPARTMENT_OTHER): Payer: Medicare Other | Admitting: Oncology

## 2022-07-18 ENCOUNTER — Encounter: Payer: Self-pay | Admitting: Oncology

## 2022-07-18 VITALS — BP 136/68 | HR 77 | Temp 96.2°F | Resp 18 | Wt 152.4 lb

## 2022-07-18 DIAGNOSIS — R590 Localized enlarged lymph nodes: Secondary | ICD-10-CM | POA: Diagnosis not present

## 2022-07-18 DIAGNOSIS — R933 Abnormal findings on diagnostic imaging of other parts of digestive tract: Secondary | ICD-10-CM

## 2022-07-18 DIAGNOSIS — R591 Generalized enlarged lymph nodes: Secondary | ICD-10-CM

## 2022-07-18 NOTE — Progress Notes (Addendum)
Hematology/Oncology Progress note [phone] [phone]           Patient Care Team: Burnard Hawthorne, FNP as PCP - General (Family Medicine) Kate Sable, MD as PCP - Cardiology (Cardiology) Earlie Server, MD as Consulting Physician (Oncology) Clent Jacks, RN as Oncology Nurse Navigator   ASSESSMENT & PLAN:   Lymphadenopathy #Abnormal CT scan: right side circumferential thickening with increased mesenteric lymphadenopathy,  CEA normal. Colonoscopy negative. PET scan results reviewed and discussed with patient. There is no increased hypermetabolic activity.  Neuroendocrine carcinoma is a possibility. Serum chromogranin A level is elevated.  She has not submitted 24-hour urine 5-HIAA level. Recommend dotatate PET scan for further evaluation.  She agrees.  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 Refer to surgery for evaluation of feasibility of resection.    Orders Placed This Encounter  Procedures   NM PET DOTATATE SKULL BASE TO MID THIGH    Standing Status:   Future    Standing Expiration Date:   07/19/2023    Order Specific Question:   If indicated for the ordered procedure, I authorize the administration of a radiopharmaceutical per Radiology protocol    Answer:   Yes    Order Specific Question:   Preferred imaging location?    Answer:   [facility]   Follow-up to be determined.  Depending on PET scan results. All questions were answered. The patient knows to call the clinic with any problems, questions or concerns.  Earlie Server, MD, PhD [facility] Hematology Oncology 07/18/2022   CHIEF COMPLAINTS/REASON FOR VISIT:  lymphadenopathy  HISTORY OF PRESENTING ILLNESS:   Donna Ferguson is a  81 y.o.  female with PMH listed below was seen in consultation at the request of  Burnard Hawthorne, FNP  for evaluation of lymphadenopathy.   Patient  was recently seen by primary care provider.  She has experienced left lower quadrant pain.  Patient reports symptoms being triggered after eating fatty food.  She has a history of IBS. 11/17/2021, CT abdomen pelvis without contrast was obtained for further evaluation.  CT showed no acute abnormality of the abdomen or pelvis.  Scattered sigmoid colonic diverticulosis without findings of acute diverticulitis.  Nonspecific prominent/mildly enlarged lymph nodes along the mesenteric root measuring up to 13 mm in short axis previously measuring up to 9 mm.  Constipation.  05/31/2022, colonoscopy showed multiple polyps resected.  Tubular adenoma.  No malignancy. Patient present to discuss further steps.  No new complaints.  INTERVAL HISTORY Donna Ferguson is a 81 y.o. female who has above history reviewed by me today presents for follow up visit for mesenteric lymphadenopathy. Patient has chronic constipation.  She reports history of IBS Denies any unintentional weight loss, abdominal pain, or blood in the stool.   Review of Systems  Constitutional:  Negative for appetite change, chills, fatigue and fever.  HENT:   Negative for hearing loss and voice change.   Eyes:  Negative for eye problems.  Respiratory:  Negative for chest tightness and cough.   Cardiovascular:  Negative for chest pain.  Gastrointestinal:  Positive for constipation. Negative for abdominal distention, abdominal pain and blood in stool.       Chronic IBS symptoms  Endocrine: Negative for hot flashes.  Genitourinary:  Negative for difficulty urinating and frequency.   Musculoskeletal:  Negative for arthralgias.  Skin:  Negative for itching and rash.  Neurological:  Negative for extremity weakness.  Hematological:  Negative for adenopathy.  Psychiatric/Behavioral:  Negative for confusion.     MEDICAL HISTORY:  Past Medical History:  Diagnosis Date   Arthritis    Chronic kidney disease, stage 3a ([place])    cyst near kidney    Constipation    Heart murmur    past   History of colonoscopy    2019 in [place]; patient states no longer screening for colon cancer.   Hypertension    IBS (irritable bowel syndrome)    Mitral valve insufficiency    Pneumonia    PONV (postoperative nausea and vomiting)    Pre-diabetes    Renal cyst    Right leg DVT ([place])     SURGICAL HISTORY: Past Surgical History:  Procedure Laterality Date   ABDOMINAL HYSTERECTOMY     unsure if has ovaries.    BREAST EXCISIONAL BIOPSY Bilateral late 80s, early 90s    benign   BREAST SURGERY     COLONOSCOPY     COLONOSCOPY WITH PROPOFOL N/A 05/31/2022   Procedure: COLONOSCOPY WITH PROPOFOL;  Surgeon: Lin Landsman, MD;  Location: [facility] ENDOSCOPY;  Service: Gastroenterology;  Laterality: N/A;   cyst N/A    cyst removal both breast   JOINT REPLACEMENT     REPLACEMENT TOTAL KNEE Left 2017   TOTAL KNEE ARTHROPLASTY Right 12/28/2021   Procedure: TOTAL KNEE ARTHROPLASTY;  Surgeon: Corky Mull, MD;  Location: [facility] ORS;  Service: Orthopedics;  Laterality: Right;    SOCIAL HISTORY: Social History   Socioeconomic History   Marital status: Married    Spouse name: Roosevelt   Number of children: 2   Years of education: Not on file   Highest education level: Not on file  Occupational History   Not on file  Tobacco Use   Smoking status: Never    Passive exposure: Past   Smokeless tobacco: Never  Vaping Use   Vaping Use: Never used  Substance and Sexual Activity   Alcohol use: Never   Drug use: Never   Sexual activity: Yes    Birth control/protection: Surgical  Other Topics Concern   Not on file  Social History Narrative   [employer] in [place]- Retired   2 children son & daughter   Married   From [place] by [address] of [place]   Retired 2011.    Enjoys painting   Social Determinants of Health   Financial Resource Strain: Low Risk  (07/27/2021)   Overall Financial Resource Strain (CARDIA)     Difficulty of Paying Living Expenses: Not hard at all  Food Insecurity: No Food Insecurity (07/27/2021)   Hunger Vital Sign    Worried About Running Out of Food in the Last Year: Never true    Ran Out of Food in the Last Year: Never true  Transportation Needs: No Transportation Needs (07/27/2021)   PRAPARE - Hydrologist (Medical): No    Lack of Transportation (Non-Medical): No  Physical Activity: Not on file  Stress: No Stress Concern Present (07/27/2021)   [place]    Feeling of Stress : Not at all  Social Connections: Unknown (07/27/2021)   Social Connection and Isolation Panel [NHANES]    Frequency of Communication with Friends and Family: Not on file    Frequency of Social Gatherings with Friends and Family: Not on file    Attends Religious Services: Not on file    Active Member of Clubs  or Organizations: Not on file    Attends Club or Organization Meetings: Not on file    Marital Status: Married  Intimate Partner Violence: Not At Risk (07/27/2021)   Humiliation, Afraid, Rape, and Kick questionnaire    Fear of Current or Ex-Partner: No    Emotionally Abused: No    Physically Abused: No    Sexually Abused: No    FAMILY HISTORY: Family History  Problem Relation Age of Onset   Hypertension Mother    Diabetes Father    Hypertension Father    Heart disease Father    Hypertension Sister    Throat cancer Sister    Hypertension Daughter    Breast cancer Neg Hx     ALLERGIES:  has No Known Allergies.  MEDICATIONS:  Current Outpatient Medications  Medication Sig Dispense Refill   acetaminophen (TYLENOL) 325 MG tablet Take 1-2 tablets (325-650 mg total) by mouth every 6 (six) hours as needed for mild pain (pain score 1-3 or temp > 100.5). 60 tablet 0   amLODipine (NORVASC) 5 MG tablet TAKE 1 TABLET(5 MG) BY MOUTH EVERY EVENING (Patient taking differently: Take 5 mg by mouth daily.) 90  tablet 1   cholecalciferol (VITAMIN D3) 25 MCG (1000 UNIT) tablet Take 1,000 Units by mouth daily.     losartan (COZAAR) 25 MG tablet TAKE 1 TABLET(25 MG) BY MOUTH DAILY 90 tablet 1   metoprolol succinate (TOPROL-XL) 50 MG 24 hr tablet TAKE 1 TABLET(50 MG) BY MOUTH DAILY WITH OR IMMEDIATELY FOLLOWING A MEAL 90 tablet 1   Trospium Chloride 60 MG CP24 Take 1 capsule (60 mg total) by mouth every morning. 90 capsule 1   vitamin C (ASCORBIC ACID) 500 MG tablet Take 500 mg by mouth daily.     famotidine (PEPCID) 20 MG tablet Take 1 tablet (20 mg total) by mouth at bedtime. (Patient not taking: Reported on 07/18/2022) 30 tablet 2   Magnesium Hydroxide (DULCOLAX PO) Take 1 tablet by mouth every other day. (Patient not taking: Reported on 06/07/2022)     mupirocin ointment (BACTROBAN) 2 % Apply 1 Application topically 2 (two) times daily. (Patient not taking: Reported on 06/07/2022) 30 g 0   polyethylene glycol powder (GLYCOLAX/MIRALAX) 17 GM/SCOOP powder Take 1 Container by mouth daily as needed. (Patient not taking: Reported on 06/07/2022)     No current facility-administered medications for this visit.     PHYSICAL EXAMINATION: ECOG PERFORMANCE STATUS: 0 - Asymptomatic Vitals:   07/18/22 1447  BP: 136/68  Pulse: 77  Resp: 18  Temp: (!) 96.2 F (35.7 C)   Filed Weights   07/18/22 1447  Weight: 152 lb 6.4 oz (69.1 kg)    Physical Exam Constitutional:      General: She is not in acute distress. HENT:     Head: Normocephalic and atraumatic.  Eyes:     General: No scleral icterus. Cardiovascular:     Rate and Rhythm: Normal rate and regular rhythm.     Heart sounds: Normal heart sounds.  Pulmonary:     Effort: Pulmonary effort is normal. No respiratory distress.     Breath sounds: No wheezing.  Abdominal:     General: Bowel sounds are normal. There is no distension.     Palpations: Abdomen is soft.  Musculoskeletal:        General: No deformity. Normal range of motion.      Cervical back: Normal range of motion and neck supple.  Skin:    General: Skin is warm  and dry.     Findings: No erythema or rash.  Neurological:     Mental Status: She is alert and oriented to person, place, and time. Mental status is at baseline.     Cranial Nerves: No cranial nerve deficit.     Coordination: Coordination normal.  Psychiatric:        Mood and Affect: Mood normal.     LABORATORY DATA:  I have reviewed the data as listed    Latest Ref Rng & Units 05/03/2022    9:46 AM 04/02/2022   10:56 AM 12/29/2021    5:30 AM  CBC  WBC 4.0 - 10.5 K/uL 8.5  9.0  12.5   Hemoglobin 12.0 - 15.0 g/dL 13.2  12.4  12.2   Hematocrit 36.0 - 46.0 % 40.7  40.3  37.4   Platelets 150 - 400 K/uL 331  303.0  246       Latest Ref Rng & Units 05/03/2022    9:46 AM 04/02/2022   10:56 AM 12/29/2021    5:30 AM  CMP  Glucose 70 - 99 mg/dL 113  117  142   BUN 8 - 23 mg/dL '19  17  22   '$ Creatinine 0.44 - 1.00 mg/dL 0.93  0.95  1.21   Sodium 135 - 145 mmol/L 138  140  137   Potassium 3.5 - 5.1 mmol/L 4.0  3.6  4.1   Chloride 98 - 111 mmol/L 106  102  103   CO2 22 - 32 mmol/L '25  25  26   '$ Calcium 8.9 - 10.3 mg/dL 9.6  9.7  9.3   Total Protein 6.5 - 8.1 g/dL 7.9  6.9    Total Bilirubin 0.3 - 1.2 mg/dL 0.6  0.4    Alkaline Phos 38 - 126 U/L 94  88    AST 15 - 41 U/L 20  14    ALT 0 - 44 U/L 11  8        RADIOGRAPHIC STUDIES: I have personally reviewed the radiological images as listed and agreed with the findings in the report. No results found.

## 2022-07-18 NOTE — Assessment & Plan Note (Addendum)
#  Abnormal CT scan: right side circumferential thickening with increased mesenteric lymphadenopathy,  CEA normal. Colonoscopy negative. PET scan results reviewed and discussed with patient. There is no increased hypermetabolic activity.  Neuroendocrine carcinoma is a possibility. Serum chromogranin A level is elevated.  She has not submitted 24-hour urine 5-HIAA level. Recommend dotatate PET scan for further evaluation.  She agrees.  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 Refer to surgery for evaluation of feasibility of resection.

## 2022-07-19 ENCOUNTER — Other Ambulatory Visit: Payer: Self-pay | Admitting: Family

## 2022-07-19 DIAGNOSIS — I10 Essential (primary) hypertension: Secondary | ICD-10-CM

## 2022-08-02 ENCOUNTER — Ambulatory Visit
Admission: RE | Admit: 2022-08-02 | Discharge: 2022-08-02 | Disposition: A | Discharge status: Home / Self Care | Payer: Medicare Other | Source: Ambulatory Visit | Attending: Oncology | Admitting: Oncology

## 2022-08-02 DIAGNOSIS — R591 Generalized enlarged lymph nodes: Secondary | ICD-10-CM

## 2022-08-02 DIAGNOSIS — R590 Localized enlarged lymph nodes: Secondary | ICD-10-CM | POA: Insufficient documentation

## 2022-08-02 DIAGNOSIS — C786 Secondary malignant neoplasm of retroperitoneum and peritoneum: Secondary | ICD-10-CM | POA: Diagnosis not present

## 2022-08-02 DIAGNOSIS — R933 Abnormal findings on diagnostic imaging of other parts of digestive tract: Secondary | ICD-10-CM | POA: Insufficient documentation

## 2022-08-02 DIAGNOSIS — R948 Abnormal results of function studies of other organs and systems: Secondary | ICD-10-CM | POA: Diagnosis not present

## 2022-08-02 MED ORDER — COPPER CU 64 DOTATATE 1 MCI/ML IV SOLN
4.0000 | Freq: Once | INTRAVENOUS | Status: AC
  Administered 2022-08-02: 4.13 via INTRAVENOUS

## 2022-08-06 ENCOUNTER — Ambulatory Visit (INDEPENDENT_AMBULATORY_CARE_PROVIDER_SITE_OTHER): Payer: Medicare Other

## 2022-08-06 VITALS — Ht 63.0 in | Wt 152.0 lb

## 2022-08-06 DIAGNOSIS — Z0000 Encounter for general adult medical examination without abnormal findings: Secondary | ICD-10-CM

## 2022-08-06 NOTE — Patient Instructions (Addendum)
[name] , Thank you for taking time to come for your Medicare Wellness Visit. I appreciate your ongoing commitment to your health goals. Please review the following plan we discussed and let me know if I can assist you in the future.   These are the goals we discussed:  Goals      Maintain Healthy Lifestyle     Stay active Healthy diet        This is a list of the screening recommended for you and due dates:  Health Maintenance  Topic Date Due   DTaP/Tdap/Td vaccine (1 - Tdap) Never done   COVID-19 Vaccine (5 - 2023-24 season) 08/22/2022*   Flu Shot  11/18/2022*   Hepatitis C Screening: USPSTF Recommendation to screen - Ages 18-79 yo.  08/07/2023*   Medicare Annual Wellness Visit  08/07/2023   Colon Cancer Screening  06/01/2027   Pneumonia Vaccine  Completed   DEXA scan (bone density measurement)  Completed   Zoster (Shingles) Vaccine  Completed   HPV Vaccine  Aged Out  *Topic was postponed. The date shown is not the original due date.    Advanced directives: End of life planning; Advanced aging; Advanced directives discussed.  No HCPOA/Living Will.  Additional information available in office. declined at this time.  Conditions/risks identified: none new  Next appointment: Follow up in one year for your annual wellness visit    Preventive Care 65 Years and Older, Female Preventive care refers to lifestyle choices and visits with your health care provider that can promote health and wellness. What does preventive care include? A yearly physical exam. This is also called an annual well check. Dental exams once or twice a year. Routine eye exams. Ask your health care provider how often you should have your eyes checked. Personal lifestyle choices, including: Daily care of your teeth and gums. Regular physical activity. Eating a healthy diet. Avoiding tobacco and drug use. Limiting alcohol use. Practicing safe sex. Taking low-dose aspirin every day. Taking vitamin  and mineral supplements as recommended by your health care provider. What happens during an annual well check? The services and screenings done by your health care provider during your annual well check will depend on your age, overall health, lifestyle risk factors, and family history of disease. Counseling  Your health care provider may ask you questions about your: Alcohol use. Tobacco use. Drug use. Emotional well-being. Home and relationship well-being. Sexual activity. Eating habits. History of falls. Memory and ability to understand (cognition). Work and work Statistician. Reproductive health. Screening  You may have the following tests or measurements: Height, weight, and BMI. Blood pressure. Lipid and cholesterol levels. These may be checked every 5 years, or more frequently if you are over 65 years old. Skin check. Lung cancer screening. You may have this screening every year starting at age 50 if you have a 30-pack-year history of smoking and currently smoke or have quit within the past 15 years. Fecal occult blood test (FOBT) of the stool. You may have this test every year starting at age 18. Flexible sigmoidoscopy or colonoscopy. You may have a sigmoidoscopy every 5 years or a colonoscopy every 10 years starting at age 56. Hepatitis C blood test. Hepatitis B blood test. Sexually transmitted disease (STD) testing. Diabetes screening. This is done by checking your blood sugar (glucose) after you have not eaten for a while (fasting). You may have this done every 1-3 years. Bone density scan. This is done to screen for osteoporosis. You may  have this done starting at age 59. Mammogram. This may be done every 1-2 years. Talk to your health care provider about how often you should have regular mammograms. Talk with your health care provider about your test results, treatment options, and if necessary, the need for more tests. Vaccines  Your health care provider may recommend  certain vaccines, such as: Influenza vaccine. This is recommended every year. Tetanus, diphtheria, and acellular pertussis (Tdap, Td) vaccine. You may need a Td booster every 10 years. Zoster vaccine. You may need this after age 38. Pneumococcal 13-valent conjugate (PCV13) vaccine. One dose is recommended after age 42. Pneumococcal polysaccharide (PPSV23) vaccine. One dose is recommended after age 80. Talk to your health care provider about which screenings and vaccines you need and how often you need them. This information is not intended to replace advice given to you by your health care provider. Make sure you discuss any questions you have with your health care provider. Document Released: 09/02/2015 Document Revised: 04/25/2016 Document Reviewed: 06/07/2015 Elsevier Interactive Patient Education  2017 [place] Prevention in the Home Falls can cause injuries. They can happen to people of all ages. There are many things you can do to make your home safe and to help prevent falls. What can I do on the outside of my home? Regularly fix the edges of walkways and driveways and fix any cracks. Remove anything that might make you trip as you walk through a door, such as a raised step or threshold. Trim any bushes or trees on the path to your home. Use bright outdoor lighting. Clear any walking paths of anything that might make someone trip, such as rocks or tools. Regularly check to see if handrails are loose or broken. Make sure that both sides of any steps have handrails. Any raised decks and porches should have guardrails on the edges. Have any leaves, snow, or ice cleared regularly. Use sand or salt on walking paths during winter. Clean up any spills in your garage right away. This includes oil or grease spills. What can I do in the bathroom? Use night lights. Install grab bars by the toilet and in the tub and shower. Do not use towel bars as grab bars. Use non-skid mats or  decals in the tub or shower. If you need to sit down in the shower, use a plastic, non-slip stool. Keep the floor dry. Clean up any water that spills on the floor as soon as it happens. Remove soap buildup in the tub or shower regularly. Attach bath mats securely with double-sided non-slip rug tape. Do not have throw rugs and other things on the floor that can make you trip. What can I do in the bedroom? Use night lights. Make sure that you have a light by your bed that is easy to reach. Do not use any sheets or blankets that are too big for your bed. They should not hang down onto the floor. Have a firm chair that has side arms. You can use this for support while you get dressed. Do not have throw rugs and other things on the floor that can make you trip. What can I do in the kitchen? Clean up any spills right away. Avoid walking on wet floors. Keep items that you use a lot in easy-to-reach places. If you need to reach something above you, use a strong step stool that has a grab bar. Keep electrical cords out of the way. Do not use floor polish  or wax that makes floors slippery. If you must use wax, use non-skid floor wax. Do not have throw rugs and other things on the floor that can make you trip. What can I do with my stairs? Do not leave any items on the stairs. Make sure that there are handrails on both sides of the stairs and use them. Fix handrails that are broken or loose. Make sure that handrails are as long as the stairways. Check any carpeting to make sure that it is firmly attached to the stairs. Fix any carpet that is loose or worn. Avoid having throw rugs at the top or bottom of the stairs. If you do have throw rugs, attach them to the floor with carpet tape. Make sure that you have a light switch at the top of the stairs and the bottom of the stairs. If you do not have them, ask someone to add them for you. What else can I do to help prevent falls? Wear shoes that: Do not  have high heels. Have rubber bottoms. Are comfortable and fit you well. Are closed at the toe. Do not wear sandals. If you use a stepladder: Make sure that it is fully opened. Do not climb a closed stepladder. Make sure that both sides of the stepladder are locked into place. Ask someone to hold it for you, if possible. Clearly mark and make sure that you can see: Any grab bars or handrails. First and last steps. Where the edge of each step is. Use tools that help you move around (mobility aids) if they are needed. These include: Canes. Walkers. Scooters. Crutches. Turn on the lights when you go into a dark area. Replace any light bulbs as soon as they burn out. Set up your furniture so you have a clear path. Avoid moving your furniture around. If any of your floors are uneven, fix them. If there are any pets around you, be aware of where they are. Review your medicines with your doctor. Some medicines can make you feel dizzy. This can increase your chance of falling. Ask your doctor what other things that you can do to help prevent falls. This information is not intended to replace advice given to you by your health care provider. Make sure you discuss any questions you have with your health care provider. Document Released: 06/02/2009 Document Revised: 01/12/2016 Document Reviewed: 09/10/2014 Elsevier Interactive Patient Education  2017 [employer].

## 2022-08-06 NOTE — Progress Notes (Signed)
Subjective:   Donna Ferguson is a 81 y.o. female who presents for Medicare Annual (Subsequent) preventive examination.  Review of Systems    No ROS.  Medicare Wellness Virtual Visit.  Visual/audio telehealth visit, UTA vital signs.   See social history for additional risk factors.   Cardiac Risk Factors include: advanced age (>60mn, >>89women);hypertension     Objective:    Today's Vitals   08/06/22 1312  Weight: 152 lb (68.9 kg)  Height: '5\' 3"'$  (1.6 m)   Body mass index is 26.93 kg/m.     08/06/2022    1:20 PM 07/18/2022    2:43 PM 06/07/2022   10:01 AM 05/31/2022    7:30 AM 05/03/2022   10:03 AM 12/28/2021    9:03 AM 12/18/2021   11:35 AM  Advanced Directives  Does Patient Have a Medical Advance Directive? No No No No No No No  Would patient like information on creating a medical advance directive? No - Patient declined     No - Patient declined No - Patient declined    Current Medications (verified) Outpatient Encounter Medications as of 08/06/2022  Medication Sig   acetaminophen (TYLENOL) 325 MG tablet Take 1-2 tablets (325-650 mg total) by mouth every 6 (six) hours as needed for mild pain (pain score 1-3 or temp > 100.5).   amLODipine (NORVASC) 5 MG tablet TAKE 1 TABLET(5 MG) BY MOUTH EVERY EVENING   cholecalciferol (VITAMIN D3) 25 MCG (1000 UNIT) tablet Take 1,000 Units by mouth daily.   famotidine (PEPCID) 20 MG tablet Take 1 tablet (20 mg total) by mouth at bedtime. (Patient not taking: Reported on 07/18/2022)   losartan (COZAAR) 25 MG tablet TAKE 1 TABLET(25 MG) BY MOUTH DAILY   Magnesium Hydroxide (DULCOLAX PO) Take 1 tablet by mouth every other day. (Patient not taking: Reported on 06/07/2022)   metoprolol succinate (TOPROL-XL) 50 MG 24 hr tablet TAKE 1 TABLET(50 MG) BY MOUTH DAILY WITH OR IMMEDIATELY FOLLOWING A MEAL   mupirocin ointment (BACTROBAN) 2 % Apply 1 Application topically 2 (two) times daily. (Patient not taking: Reported on 06/07/2022)    polyethylene glycol powder (GLYCOLAX/MIRALAX) 17 GM/SCOOP powder Take 1 Container by mouth daily as needed. (Patient not taking: Reported on 06/07/2022)   Trospium Chloride 60 MG CP24 Take 1 capsule (60 mg total) by mouth every morning.   vitamin C (ASCORBIC ACID) 500 MG tablet Take 500 mg by mouth daily.   No facility-administered encounter medications on file as of 08/06/2022.    Allergies (verified) Patient has no known allergies.   History: Past Medical History:  Diagnosis Date   Arthritis    Chronic kidney disease, stage 3a ([facility]    cyst near kidney   Constipation    Heart murmur    past   History of colonoscopy    [place] patient states no longer screening for colon cancer.   Hypertension    IBS (irritable bowel syndrome)    Mitral valve insufficiency    Pneumonia    PONV (postoperative nausea and vomiting)    Pre-diabetes    Renal cyst    Right leg DVT (HCC)    Past Surgical History:  Procedure Laterality Date   ABDOMINAL HYSTERECTOMY     unsure if has ovaries.    BREAST EXCISIONAL BIOPSY Bilateral late 80s, early 90s    benign   BREAST SURGERY     COLONOSCOPY     COLONOSCOPY WITH PROPOFOL N/A 05/31/2022   Procedure: COLONOSCOPY WITH PROPOFOL;  Surgeon: Lin Landsman, MD;  Location: [facility] ENDOSCOPY;  Service: Gastroenterology;  Laterality: N/A;   cyst N/A    cyst removal both breast   JOINT REPLACEMENT     REPLACEMENT TOTAL KNEE Left 2017   TOTAL KNEE ARTHROPLASTY Right 12/28/2021   Procedure: TOTAL KNEE ARTHROPLASTY;  Surgeon: Corky Mull, MD;  Location: [facility] ORS;  Service: Orthopedics;  Laterality: Right;   Family History  Problem Relation Age of Onset   Hypertension Mother    Diabetes Father    Hypertension Father    Heart disease Father    Hypertension Sister    Throat cancer Sister    Hypertension Daughter    Breast cancer Neg Hx    Social History   Socioeconomic History   Marital status: Married    Spouse name: Roosevelt   Number  of children: 2   Years of education: Not on file   Highest education level: Not on file  Occupational History   Not on file  Tobacco Use   Smoking status: Never    Passive exposure: Past   Smokeless tobacco: Never  Vaping Use   Vaping Use: Never used  Substance and Sexual Activity   Alcohol use: Never   Drug use: Never   Sexual activity: Yes    Birth control/protection: Surgical  Other Topics Concern   Not on file  Social History Narrative   [place] in [place]- Retired   2 children son & daughter   Married   From [place] by [address] of [place]   Retired 2011.    Enjoys painting   Social Determinants of Health   Financial Resource Strain: Low Risk  (08/06/2022)   Overall Financial Resource Strain (CARDIA)    Difficulty of Paying Living Expenses: Not hard at all  Food Insecurity: No Food Insecurity (08/06/2022)   Hunger Vital Sign    Worried About Running Out of Food in the Last Year: Never true    Ran Out of Food in the Last Year: Never true  Transportation Needs: No Transportation Needs (08/06/2022)   PRAPARE - Hydrologist (Medical): No    Lack of Transportation (Non-Medical): No  Physical Activity: Not on file  Stress: No Stress Concern Present (08/06/2022)   [place]    Feeling of Stress : Not at all  Social Connections: Unknown (08/06/2022)   Social Connection and Isolation Panel [NHANES]    Frequency of Communication with Friends and Family: Not on file    Frequency of Social Gatherings with Friends and Family: Not on file    Attends Religious Services: Not on file    Active Member of [place] or Organizations: Not on file    Attends Archivist Meetings: Not on file    Marital Status: Married    Tobacco Counseling Counseling given: Not Answered   Clinical Intake:  Pre-visit preparation completed: Yes         Diabetes: No  How often do you need to have someone help you when you read instructions, pamphlets, or other written materials from your doctor or pharmacy?: 1 - Never    Interpreter Needed?: No      Activities of Daily Living    08/06/2022    1:22 PM 12/18/2021   11:34 AM  In your present state of health, do you have any difficulty performing the following activities:  Hearing? 0 0  Vision? 0  1  Comment  poor vision left eye  Difficulty concentrating or making decisions? 0 0  Walking or climbing stairs? 0 0  Comment Paces self when walking or climbing stairs   Dressing or bathing? 0 0  Doing errands, shopping? 1   Comment Family Diplomatic Services operational officer and eating ? N   Using the Toilet? N   In the past six months, have you accidently leaked urine? N   Comment Managed with medication and followed by PCP   Do you have problems with loss of bowel control? N   Managing your Medications? N   Managing your Finances? N   Housekeeping or managing your Housekeeping? N     Patient Care Team: Burnard Hawthorne, FNP as PCP - General (Family Medicine) Kate Sable, MD as PCP - Cardiology (Cardiology) Earlie Server, MD as Consulting Physician (Oncology) Clent Jacks, RN as Oncology Nurse Navigator  Indicate any recent Medical Services you may have received from other than [facility] providers in the past year (date may be approximate).     Assessment:   This is a routine wellness examination for Treyana.  I connected with  Nealie Monforte on 08/06/22 by a audio enabled telemedicine application and verified that I am speaking with the correct person using two identifiers.  Patient Location: Home  Provider Location: Office/Clinic  I discussed the limitations of evaluation and management by telemedicine. The patient expressed understanding and agreed to proceed.   Hearing/Vision screen Hearing Screening - Comments:: Patient is able to hear conversational tones without  difficulty. No issues reported. Vision Screening - Comments:: Wears corrective lenses  They have seen their ophthalmologist in the last 12 months.   Dietary issues and exercise activities discussed: Current Exercise Habits: Home exercise routine, Intensity: Mild Low sodium diet   Goals Addressed             This Visit's Progress    Maintain Healthy Lifestyle   On track    Stay active Healthy diet       Depression Screen    08/06/2022    1:19 PM 05/16/2022    1:45 PM 04/02/2022   10:29 AM 11/17/2021    9:12 AM 09/18/2021    8:31 AM 07/27/2021    3:02 PM 10/07/2020    3:27 PM  PHQ 2/9 Scores  PHQ - 2 Score 0 0 0 0 1 0 0  PHQ- 9 Score    0       Fall Risk    08/06/2022    1:21 PM 05/16/2022    1:45 PM 04/02/2022   10:29 AM 11/17/2021    9:12 AM 09/18/2021    8:31 AM  [place] in the past year? 0 0 0 0 1  Number falls in past yr: 0 0 0 0 0  Injury with Fall? 0 0 0 0 0  Risk for fall due to :  No Fall Risks No Fall Risks No Fall Risks History of fall(s)  Follow up Falls evaluation completed;Falls prevention discussed Falls evaluation completed Falls evaluation completed Falls evaluation completed Falls evaluation completed    FALL RISK PREVENTION PERTAINING TO THE HOME: Home free of loose throw rugs in walkways, pet beds, electrical cords, etc? Yes  Adequate lighting in your home to reduce risk of falls? Yes   ASSISTIVE DEVICES UTILIZED TO PREVENT FALLS: Life alert? No  Use of a cane, walker or w/c? No   TIMED UP AND GO: Was the  test performed? No .   Cognitive Function:  Patient is alert and oriented x3.  Manages her own medications and finances.  100% independent.       08/06/2022    1:25 PM  6CIT Screen  Months in reverse 0 points    Immunizations Immunization History  Administered Date(s) Administered   Fluad Quad(high Dose 65+) 05/04/2019, 06/14/2021   Influenza, High Dose Seasonal PF 06/03/2020   PFIZER(Purple Top)SARS-COV-2 Vaccination  09/22/2019, 10/13/2019, 05/18/2020, 03/13/2021   PNEUMOCOCCAL CONJUGATE-20 12/07/2020   Zoster Recombinat (Shingrix) 01/03/2018, 11/21/2019   TDAP status: Due, Education has been provided regarding the importance of this vaccine. Advised may receive this vaccine at local pharmacy or Health Dept. Aware to provide a copy of the vaccination record if obtained from local pharmacy or Health Dept. Verbalized acceptance and understanding.  Covid-19 vaccine status: Completed vaccines x4.  Screening Tests Health Maintenance  Topic Date Due   DTaP/Tdap/Td (1 - Tdap) Never done   COVID-19 Vaccine (5 - 2023-24 season) 08/22/2022 (Originally 04/20/2022)   INFLUENZA VACCINE  11/18/2022 (Originally 03/20/2022)   Hepatitis C Screening  08/07/2023 (Originally 09/03/1958)   Medicare Annual Wellness (AWV)  08/07/2023   COLONOSCOPY (Pts 45-2yr Insurance coverage will need to be confirmed)  06/01/2027   Pneumonia Vaccine 81 Years old  Completed   DEXA SCAN  Completed   Zoster Vaccines- Shingrix  Completed   HPV VACCINES  Aged Out   Health Maintenance Health Maintenance Due  Topic Date Due   DTaP/Tdap/Td (1 - Tdap) Never done   Lung Cancer Screening: (Low Dose CT Chest recommended if Age 79-80years, 30 pack-year currently smoking OR have quit w/in 15years.) does not qualify.   Hepatitis C Screening: does not qualify. Discontinued per patient.   Vision Screening: Recommended annual ophthalmology exams for early detection of glaucoma and other disorders of the eye.  Dental Screening: Recommended annual dental exams for proper oral hygiene.  Community Resource Referral / Chronic Care Management: CRR required this visit?  No   CCM required this visit?  No      Plan:     I have personally reviewed and noted the following in the patient's chart:   Medical and social history Use of alcohol, tobacco or illicit drugs  Current medications and supplements including opioid prescriptions. Patient is not  currently taking opioid prescriptions. Functional ability and status Nutritional status Physical activity Advanced directives List of other physicians Hospitalizations, surgeries, and ER visits in previous 12 months Vitals Screenings to include cognitive, depression, and falls Referrals and appointments  In addition, I have reviewed and discussed with patient certain preventive protocols, quality metrics, and best practice recommendations. A written personalized care plan for preventive services as well as general preventive health recommendations were provided to patient.     DLeta Jungling LPN   [id number]

## 2022-08-15 ENCOUNTER — Telehealth: Payer: Self-pay

## 2022-08-15 ENCOUNTER — Other Ambulatory Visit: Payer: Self-pay

## 2022-08-15 ENCOUNTER — Other Ambulatory Visit: Payer: Self-pay | Admitting: Family

## 2022-08-15 DIAGNOSIS — R591 Generalized enlarged lymph nodes: Secondary | ICD-10-CM

## 2022-08-15 DIAGNOSIS — N3281 Overactive bladder: Secondary | ICD-10-CM

## 2022-08-15 DIAGNOSIS — R948 Abnormal results of function studies of other organs and systems: Secondary | ICD-10-CM

## 2022-08-15 NOTE — Telephone Encounter (Signed)
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  I recommend her to establish care with surgery, refer to Dr.Pabon for evaluation of resectability.

## 2022-08-15 NOTE — Telephone Encounter (Signed)
Attempt made to reach pt again this afternoon and no answer. Detailed VM left on cell ph. Referral entered to Dr. Corlis Leak office.

## 2022-08-17 NOTE — Telephone Encounter (Signed)
Spoke to pt and she confirmed that she received VM. She will wait for call from Dr. Corlis Leak office. His office ph number was provided to her as well.

## 2022-08-21 ENCOUNTER — Ambulatory Visit: Payer: Medicare Other | Admitting: Gastroenterology

## 2022-08-22 ENCOUNTER — Ambulatory Visit (INDEPENDENT_AMBULATORY_CARE_PROVIDER_SITE_OTHER): Payer: Medicare Other | Admitting: Gastroenterology

## 2022-08-22 ENCOUNTER — Encounter: Payer: Self-pay | Admitting: Gastroenterology

## 2022-08-22 VITALS — BP 151/94 | HR 82 | Temp 98.4°F | Ht 63.0 in | Wt 157.8 lb

## 2022-08-22 DIAGNOSIS — K5909 Other constipation: Secondary | ICD-10-CM | POA: Diagnosis not present

## 2022-08-22 DIAGNOSIS — K219 Gastro-esophageal reflux disease without esophagitis: Secondary | ICD-10-CM | POA: Diagnosis not present

## 2022-08-22 NOTE — Patient Instructions (Addendum)
Please purchase a wedge pillow so it could help you with your acid reflux.  Please take Miralax 17 grams daily to help you with constipation.  Food Choices for Gastroesophageal Reflux Disease, Adult When you have gastroesophageal reflux disease (GERD), the foods you eat and your eating habits are very important. Choosing the right foods can help ease your discomfort. Think about working with a food expert (dietitian) to help you make good choices. What are tips for following this plan? Reading food labels Look for foods that are low in saturated fat. Foods that may help with your symptoms include: Foods that have less than 5% of daily value (DV) of fat. Foods that have 0 grams of trans fat. Cooking Do not fry your food. Cook your food by baking, steaming, grilling, or broiling. These are all methods that do not need a lot of fat for cooking. To add flavor, try to use herbs that are low in spice and acidity. Meal planning  Choose healthy foods that are low in fat, such as: Fruits and vegetables. Whole grains. Low-fat dairy products. Lean meats, fish, and poultry. Eat small meals often instead of eating 3 large meals each day. Eat your meals slowly in a place where you are relaxed. Avoid bending over or lying down until 2-3 hours after eating. Limit high-fat foods such as fatty meats or fried foods. Limit your intake of fatty foods, such as oils, butter, and shortening. Avoid the following as told by your doctor: Foods that cause symptoms. These may be different for different people. Keep a food diary to keep track of foods that cause symptoms. Alcohol. Drinking a lot of liquid with meals. Eating meals during the 2-3 hours before bed. Lifestyle Stay at a healthy weight. Ask your doctor what weight is healthy for you. If you need to lose weight, work with your doctor to do so safely. Exercise for at least 30 minutes on 5 or more days each week, or as told by your doctor. Wear  loose-fitting clothes. Do not smoke or use any products that contain nicotine or tobacco. If you need help quitting, ask your doctor. Sleep with the head of your bed higher than your feet. Use a wedge under the mattress or blocks under the bed frame to raise the head of the bed. Chew sugar-free gum after meals. What foods should eat?  Eat a healthy, well-balanced diet of fruits, vegetables, whole grains, low-fat dairy products, lean meats, fish, and poultry. Each person is different. Foods that may cause symptoms in one person may not cause any symptoms in another person. Work with your doctor to find foods that are safe for you. The items listed above may not be a complete list of what you can eat and drink. Contact a food expert for more options. What foods should I avoid? Limiting some of these foods may help in managing the symptoms of GERD. Everyone is different. Talk with a food expert or your doctor to help you find the exact foods to avoid, if any. Fruits Any fruits prepared with added fat. Any fruits that cause symptoms. For some people, this may include citrus fruits, such as oranges, grapefruit, pineapple, and lemons. Vegetables Deep-fried vegetables. [place] fries. Any vegetables prepared with added fat. Any vegetables that cause symptoms. For some people, this may include tomatoes and tomato products, chili peppers, onions and garlic, and horseradish. Grains Pastries or quick breads with added fat. Meats and other proteins High-fat meats, such as fatty beef or pork,  hot dogs, ribs, ham, sausage, salami, and bacon. Fried meat or protein, including fried fish and fried chicken. Nuts and nut butters, in large amounts. Dairy Whole milk and chocolate milk. Sour cream. Cream. Ice cream. Cream cheese. Milkshakes. Fats and oils Butter. Margarine. Shortening. Ghee. Beverages Coffee and tea, with or without caffeine. Carbonated beverages. Sodas. Energy drinks. Fruit juice made with acidic  fruits, such as orange or grapefruit. Tomato juice. Alcoholic drinks. Sweets and desserts Chocolate and cocoa. Donuts. Seasonings and condiments Pepper. Peppermint and spearmint. Added salt. Any condiments, herbs, or seasonings that cause symptoms. For some people, this may include curry, hot sauce, or vinegar-based salad dressings. The items listed above may not be a complete list of what you should not eat and drink. Contact a food expert for more options. Questions to ask your doctor Diet and lifestyle changes are often the first steps that are taken to manage symptoms of GERD. If diet and lifestyle changes do not help, talk with your doctor about taking medicines. Where to find more information International Foundation for Gastrointestinal Disorders: aboutgerd.org Summary When you have GERD, food and lifestyle choices are very important in easing your symptoms. Eat small meals often instead of 3 large meals a day. Eat your meals slowly and in a place where you are relaxed. Avoid bending over or lying down until 2-3 hours after eating. Limit high-fat foods such as fatty meats or fried foods. This information is not intended to replace advice given to you by your health care provider. Make sure you discuss any questions you have with your health care provider. Document Revised: 02/15/2020 Document Reviewed: 02/15/2020 Elsevier Patient Education  [place].

## 2022-08-22 NOTE — Progress Notes (Signed)
Jonathon Bellows MD, MRCP(U.K) [address]  [place]  [place], [place] [address]  Main: [phone]  Fax: [phone]   Gastroenterology Consultation  Referring Provider:     Burnard Hawthorne, FNP Primary Care Physician:  Burnard Hawthorne, FNP Primary Gastroenterologist:  Dr. Jonathon Bellows  Reason for Consultation:    GERD, Chronic depression        HPI:   Donna Ferguson is a 82 y.o. y/o female referred for consultation & management  by Burnard Hawthorne, FNP.     She was referred for GERD and chronic constipation back in 04/08/2022 She underwent a colonoscopy with Dr. Marius Ditch in October 2023.  Subcentimeter polyps were seen in the descending ascending colon these were resected large hemorrhoids were noted.  Diverticulosis of the sigmoid colon was also seen. She underwent a PET scan and December 2023 by Dr. 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  No tumors were identified within the colon  05/01/2022: CT scan of the abdomen multiple polyposis seen in the small bowel   07/18/2022 seen Dr. Tasia Catchings for abnormal CT scan serum chromogranin A is elevated plan was referred to surgery for consideration of small bowel resection.  She states she is here to see me for reflux symptoms of heartburn once every 2 weeks not very often.  She has not tried any medications.  She also has issues with constipation on and off.  Not tried any over-the-counter medications.  Past Medical History:  Diagnosis Date   Arthritis    Chronic kidney disease, stage 3a ([place])    cyst near kidney   Constipation    Heart murmur    past   History of colonoscopy    2019 in [place]; patient states no longer screening for colon cancer.   Hypertension    IBS (irritable bowel  syndrome)    Mitral valve insufficiency    Pneumonia    PONV (postoperative nausea and vomiting)    Pre-diabetes    Renal cyst    Right leg DVT ([facility])     Past Surgical History:  Procedure Laterality Date   ABDOMINAL HYSTERECTOMY     unsure if has ovaries.    BREAST EXCISIONAL BIOPSY Bilateral late 80s, early 90s    benign   BREAST SURGERY     COLONOSCOPY     COLONOSCOPY WITH PROPOFOL N/A 05/31/2022   Procedure: COLONOSCOPY WITH PROPOFOL;  Surgeon: Lin Landsman, MD;  Location: [facility] ENDOSCOPY;  Service: Gastroenterology;  Laterality: N/A;   cyst N/A    cyst removal both breast   JOINT REPLACEMENT     REPLACEMENT TOTAL KNEE Left 2017   TOTAL KNEE ARTHROPLASTY Right 12/28/2021   Procedure: TOTAL KNEE ARTHROPLASTY;  Surgeon: Corky Mull, MD;  Location: [facility] ORS;  Service: Orthopedics;  Laterality: Right;    Prior to Admission medications   Medication Sig Start Date End Date Taking? Authorizing Provider  acetaminophen (TYLENOL) 325 MG tablet Take 1-2 tablets (325-650 mg total) by mouth every 6 (six) hours as needed for mild pain (pain score 1-3 or temp > 100.5). 12/29/21   Lattie Corns, PA-C  amLODipine (NORVASC) 5 MG tablet TAKE 1 TABLET(5 MG) BY MOUTH EVERY EVENING 07/19/22   Burnard Hawthorne, FNP  cholecalciferol (VITAMIN D3) 25 MCG (1000 UNIT) tablet Take 1,000  Units by mouth daily.    [provider]  famotidine (PEPCID) 20 MG tablet Take 1 tablet (20 mg total) by mouth at bedtime. Patient not taking: Reported on 07/18/2022 04/02/22   Burnard Hawthorne, FNP  losartan (COZAAR) 25 MG tablet TAKE 1 TABLET(25 MG) BY MOUTH DAILY 07/04/22   Burnard Hawthorne, FNP  Magnesium Hydroxide (DULCOLAX PO) Take 1 tablet by mouth every other day. Patient not taking: Reported on 06/07/2022    [provider]  metoprolol succinate (TOPROL-XL) 50 MG 24 hr tablet TAKE 1 TABLET(50 MG) BY MOUTH DAILY WITH OR IMMEDIATELY FOLLOWING A MEAL 06/05/22   Burnard Hawthorne,  FNP  mupirocin ointment (BACTROBAN) 2 % Apply 1 Application topically 2 (two) times daily. Patient not taking: Reported on 06/07/2022 05/16/22   McLean-Scocuzza, Nino Glow, MD  polyethylene glycol powder (GLYCOLAX/MIRALAX) 17 GM/SCOOP powder Take 1 Container by mouth daily as needed. Patient not taking: Reported on 06/07/2022    [provider]  Trospium Chloride 60 MG CP24 TAKE 1 CAPSULE(60 MG) BY MOUTH EVERY MORNING 08/15/22   Burnard Hawthorne, FNP  vitamin C (ASCORBIC ACID) 500 MG tablet Take 500 mg by mouth daily.    [provider]    Family History  Problem Relation Age of Onset   Hypertension Mother    Diabetes Father    Hypertension Father    Heart disease Father    Hypertension Sister    Throat cancer Sister    Hypertension Daughter    Breast cancer Neg Hx      Social History   Tobacco Use   Smoking status: Never    Passive exposure: Past   Smokeless tobacco: Never  Vaping Use   Vaping Use: Never used  Substance Use Topics   Alcohol use: Never   Drug use: Never    Allergies as of 08/22/2022   (No Known Allergies)    Review of Systems:    All systems reviewed and negative except where noted in HPI.   Physical Exam:  BP (!) 151/94   Pulse 82   Temp 98.4 F (36.9 C) (Oral)   Ht '5\' 3"'$  (1.6 m)   Wt 157 lb 12.8 oz (71.6 kg)   BMI 27.95 kg/m  No LMP recorded. Patient has had a hysterectomy. Psych:  Alert and cooperative. Normal mood and affect. General:   Alert,  Well-developed, well-nourished, pleasant and cooperative in NAD Head:  Normocephalic and atraumatic. Eyes:  Sclera clear, no icterus.   Conjunctiva pink. Ears:  Normal auditory acuity. Psych:  Alert and cooperative. Normal mood and affect.  Imaging Studies: NM PET DOTATATE SKULL BASE TO MID THIGH  Result Date: 08/02/2022 CLINICAL DATA:  Elevated chromogranin A. RIGHT colon mass on CT 05/01/2022. Ileocecal mesentery adenopathy. EXAM: NUCLEAR MEDICINE PET SKULL BASE TO THIGH  TECHNIQUE: 4.1 mCi copper 51 DOTATATE was injected intravenously. Full-ring PET imaging was performed from the skull base to thigh after the radiotracer. CT data was obtained and used for attenuation correction and anatomic localization. COMPARISON:  None Available. FINDINGS: NECK No radiotracer activity in neck lymph nodes. Incidental CT findings: None CHEST No radiotracer accumulation within mediastinal or hilar lymph nodes. No suspicious pulmonary nodules on the CT scan. Incidental CT finding:None ABDOMEN/PELVIS Multiple foci of intense radiotracer activity associated with loops small bowel within the upper pelvis. There is no clear small-bowel lesion identified on the CT however on comparison CT from 05/01/2022 there were small bowel lesions. For example lesion in the LEFT  iliac fossa with SUV max equal 31.9 associates with the small bowel but no small-bowel lesion identified on CT. Similar lesion centrally in the upper pelvis with SUV max equal 32.4 on image 196. Rounded lesion the central pelvis on image 197 SUV max equal 29.5. This may have a small bowel component which is measurable with soft tissue lesion measuring 28 mm on image 201. Potential peritoneal or serosal metastasis Within the central mesentery of the small bowel there are several discrete nodes which are intensely radiotracer avid. For example central mesenteric node measuring 11 mm on image 180 with SUV max equal 60. No abnormal radiotracer associated with the cecum or ascending colon. No abnormal radiotracer activity in the liver. Physiologic activity noted in the liver, spleen, adrenal glands and kidneys. Incidental CT findings:No bowel obstruction. SKELETON No focal activity to suggest skeletal metastasis. Incidental CT findings:None IMPRESSION: 1. Multiple intensely radiotracer avid small bowel lesions which are essentially occult by noncontrast CT imaging. Favor multifocal small bowel neuroendocrine tumor (well differentiated). No evidence  of bowel obstruction. 2. Several central mesenteric intensely radiotracer avid well differentiated neuroendocrine tumor metastasis. 3. No liver metastasis or distant metastatic disease. 4. No neuroendocrine tumor identified within the colon. Electronically Signed   By: Suzy Bouchard M.D.   On: 08/02/2022 15:59    Assessment and Plan:   Donna Ferguson is a 82 y.o. y/o female has been referred for GERD and constipation.  Recent diagnosis of possible neuroendocrine tumor of the small bowel and surgery is being contemplated.  I explained to her that acid reflux symptoms are very seldom once in 2 weeks and hence could be managed with lifestyle changes use of a wedge pillow timing of meals and occasional use of Tums.  If it becomes more often then we will prescribe her medications for the same.  I also explained to her that constipation can be managed with MiraLAX 1-2 capfuls of MiraLAX a day samples have been provided.  I explained to her that she has multiple issues going on with her neuroendocrine tumor at this point of time and if her other GI symptoms which seem to be minimal at this point of time continue to bother her she can call us at any point of time but I do believe that MiraLAX and use of Tums should help her significantly.  Follow up in as needed  Dr Jonathon Bellows MD,MRCP(U.K)

## 2022-08-27 ENCOUNTER — Ambulatory Visit (INDEPENDENT_AMBULATORY_CARE_PROVIDER_SITE_OTHER): Payer: Medicare Other | Admitting: Surgery

## 2022-08-27 VITALS — BP 137/87 | HR 85 | Temp 98.1°F | Ht 63.0 in | Wt 157.0 lb

## 2022-08-27 DIAGNOSIS — C7A8 Other malignant neuroendocrine tumors: Secondary | ICD-10-CM | POA: Diagnosis not present

## 2022-08-27 NOTE — Patient Instructions (Signed)
We will have you follow up here in 2 weeks to talk more.  We will send a clearance request to your cardiologist.    Carcinoid Tumors A carcinoid tumor is a rare type of cancer that usually grows very slowly. These tumors can grow in any area of the body where there are cells that receive signals from the nervous system (neuroendocrine cells). Carcinoid tumors are also called neuroendocrine tumors. Some of these tumors may secrete chemical messengers (hormones). The small intestine is the most common place for carcinoid tumors to grow. They may also grow in other areas of the digestive system or in the lungs. There are three types of carcinoid tumors: Slow-growing tumors are the most common type. This type of tumor may not cause symptoms for many years. Faster-growing carcinoid tumors may spread to other areas of the body, including the liver. This type causes symptoms sooner. Hormone-secreting tumors cause symptoms because of the hormones they secrete. These symptoms are called carcinoid syndrome. What are the causes? Cancer results when healthy cells change and start to grow out of control. These cells form tumors. Exactly why this happens is not known. What increases the risk? You are more likely to develop this condition if: You are 48-39 years old. You have a family history of a condition called multiple endocrine neoplasia type 1 (MEN 1). You have a stomach condition that reduces the production of stomach acid, such as pernicious anemia, atrophic gastritis, or Zollinger-Ellison syndrome. What are the signs or symptoms? Slow-growing carcinoid tumors do not cause symptoms for many years. When symptoms do develop, they depend on the location of the tumor and whether the tumor secretes hormones that cause carcinoid syndrome. In some cases, the symptoms of carcinoid syndrome can be triggered by stress, exercise, alcohol (especially red wine), and some foods (especially cheese and  chocolate). Symptoms of carcinoid syndrome may include: Flushing of the face and neck. Recurrent diarrhea. Shortness of breath or making high-pitched whistling sounds when you breathe, most often when you breathe out (wheezing). Fast or irregular heartbeats (palpitations). Swelling of the feet and ankles. Fainting. Symptoms of digestive system carcinoids may include: Abdominal pain. Diarrhea or constipation. Inability to have a bowel movement. Nausea or vomiting. Rectal bleeding. Symptoms of lung carcinoids may include: Chest pain. Shortness of breath. Cough, sometimes with blood. How is this diagnosed? Carcinoid tumors that do not cause symptoms may be found during imaging tests or procedures done to diagnose other conditions. If your health care provider suspects a carcinoid tumor, you may have tests to confirm the diagnosis. These may include: A 24-hour urine sample. Blood tests. A type of imaging scan that is done after you get an injection of a radioactive dye that will go to a carcinoid tumor (octreotide scan). Other imaging tests, such as an X-ray, MRI, PET scan, or CT scan. A procedure to remove a sample of the tumor (biopsy) for testing. How is this treated? Treatment depends on the size and location of the tumor. For a small tumor that has not spread to other areas of the body, surgery can be done to remove the tumor. This is the most common treatment and usually cures this type of cancer. For tumors that are too big to remove or have spread to other parts of the body, treatment options may include: Surgically removing part of the tumor to make other treatments more effective. Taking medicines such as: A medicine made from a naturally occurring hormone (octreotide). Injections of octreotide may reduce symptoms  and reverse tumor growth. A medicine that boosts the immune system (interferon). This medicine may be used to increase natural resistance to cancer. Several types of  cancer drugs (chemotherapy). These may be given through an IV or taken by mouth in various combinations. Targeted therapy. This involves using drugs that target specific parts of cancer cells and the area around them to block the growth and spread of the cancer. Using high-energy rays to kill cancer cells (radiation therapy). This may be used for some carcinoid tumors. For carcinoid cancer that has spread to the liver, treatment may include having a procedure that involves using radio waves that destroy cancer, either with heat (radiofrequency ablation) or with freezing (cryoablation). Pellets may be injected to block blood supply to the tumor (embolization). Follow these instructions at home:     Take over-the-counter and prescription medicines only as told by your health care provider. Follow diet instructions from your health care provider. This may include adding more protein to your diet. You may also need to avoid any foods that trigger your symptoms. Do not drink alcohol, especially red wine. Do not use any products that contain nicotine or tobacco. These products include cigarettes, chewing tobacco, and vaping devices, such as e-cigarettes. If you need help quitting, ask your health care provider. Take actions to manage stress, such as doing meditation or deep breathing techniques. Keep all follow-up visits. This is important. Where to find more information [place]: www.cancer.gov Contact a health care provider if you: Develop any symptoms of carcinoid syndrome. Develop any new symptoms. Get help right away if you have: Chest pain. Trouble breathing. These symptoms may be an emergency. Get help right away. Call 911. Do not wait to see if the symptoms will go away. Do not drive yourself to the hospital. Summary A carcinoid tumor is a rare type of cancer that usually grows very slowly. Slow-growing carcinoid tumors do not cause symptoms for many years. When symptoms  do occur, they depend on the location of the tumor and whether the tumor secretes hormones that cause carcinoid syndrome. Carcinoid syndrome may cause flushing, wheezing, palpitations, diarrhea, swelling, and fainting. You may need blood tests, imaging tests, and a biopsy to confirm a diagnosis of carcinoid tumors. Surgery is the best treatment for a carcinoid tumor when it is still small. There are several other treatment options for tumors that are large or have spread to other areas of the body. This information is not intended to replace advice given to you by your health care provider. Make sure you discuss any questions you have with your health care provider. Document Revised: 03/28/2021 Document Reviewed: 03/28/2021 Elsevier Patient Education  [place].

## 2022-08-28 ENCOUNTER — Telehealth: Payer: Self-pay | Admitting: Cardiology

## 2022-08-28 NOTE — Telephone Encounter (Signed)
   Name: Taeya Theall  DOB: November 17, 1940  MRN: [id number]  Primary Cardiologist: Kate Sable, MD  Chart reviewed as part of pre-operative protocol coverage. Because of Donia Rusher's past medical history and time since last visit, she will require a follow-up in-office visit in order to better assess preoperative cardiovascular risk.  Pre-op covering staff: - Please schedule appointment and call patient to inform them. If patient already had an upcoming appointment within acceptable timeframe, please add "pre-op clearance" to the appointment notes so provider is aware. - Please contact requesting surgeon's office via preferred method (i.e, phone, fax) to inform them of need for appointment prior to surgery.  No medications are indicated as needing held.  Elgie Collard, PA-C  08/28/2022, 10:49 AM

## 2022-08-28 NOTE — Telephone Encounter (Signed)
Left message for the pt to call back to schedule IN OFFICE appt for pre op clearance.

## 2022-08-28 NOTE — Telephone Encounter (Signed)
   Pre-operative Risk Assessment    Patient Name: Donna Ferguson  DOB: [birth year] MRN: [id number]      Request for Surgical Clearance    Procedure:   abdominal resection of small bowel  Date of Surgery:  Clearance TBD                                 Surgeon:  Dr Caroleen Hamman Surgeon's Group or Practice Name:  [facility] Phone number:  [phone] Fax number:  [phone]   Type of Clearance Requested:   - Medical    Type of Anesthesia:  General    Additional requests/questions:    SignedAce Gins   08/28/2022, 8:48 AM

## 2022-08-30 ENCOUNTER — Encounter: Payer: Self-pay | Admitting: Surgery

## 2022-08-30 NOTE — Telephone Encounter (Signed)
Pt has appt 08/31/22 with Dr. Charlestine Night.

## 2022-08-30 NOTE — Progress Notes (Signed)
Patient ID: Donna Ferguson, female   DOB: [birth year], 82 y.o.   MRN: [id number]  HPI Donna Ferguson is a 82 y.o. female seen in consultation at the request of Dr Tasia Catchings.  She changes with regularly and is on soft diet. She does have some intermittent abdominal discomfort / pain, mild, colicky.  No other specific alleviating or aggravating factors.  She did have pain.  She did CTs of the abdomen pelvis personally reviewed as well DOTATATE scan , showin mass TI and several other active small bowel lesions c/w Multifocal neuroendocrine tumor of the SB. Colonoscopy showed small polyps, no masses.  She does have a hx  She did have a Right knee replacement on 12/2021 and is recovering well. No complications. CMP, CBC and CEA nml She is able to perform more than 4 METS w/o sob or c/p Hx DVT    HPI  Past Medical History:  Diagnosis Date   Arthritis    Chronic kidney disease, stage 3a ([place])    cyst near kidney   Constipation    Heart murmur    past   History of colonoscopy    2019 in [place]; patient states no longer screening for colon cancer.   Hypertension    IBS (irritable bowel syndrome)    Mitral valve insufficiency    Pneumonia    PONV (postoperative nausea and vomiting)    Pre-diabetes    Renal cyst    Right leg DVT ([facility])     Past Surgical History:  Procedure Laterality Date   ABDOMINAL HYSTERECTOMY     unsure if has ovaries.    BREAST EXCISIONAL BIOPSY Bilateral late 80s, early 90s    benign   BREAST SURGERY     COLONOSCOPY     COLONOSCOPY WITH PROPOFOL N/A 05/31/2022   Procedure: COLONOSCOPY WITH PROPOFOL;  Surgeon: Lin Landsman, MD;  Location[facility] ENDOSCOPY;  Service: Gastroenterology;  Laterality: N/A;   cyst N/A    cyst removal both breast   JOINT REPLACEMENT     REPLACEMENT TOTAL KNEE Left 2017   TOTAL KNEE ARTHROPLASTY Right 12/28/2021   Procedure: TOTAL KNEE ARTHROPLASTY;  Surgeon: Corky Mull, MD;  Location: [facility] ORS;  Service: Orthopedics;  Laterality: Right;     Family History  Problem Relation Age of Onset   Hypertension Mother    Diabetes Father    Hypertension Father    Heart disease Father    Hypertension Sister    Throat cancer Sister    Hypertension Daughter    Breast cancer Neg Hx     Social History Social History   Tobacco Use   Smoking status: Never    Passive exposure: Past   Smokeless tobacco: Never  Vaping Use   Vaping Use: Never used  Substance Use Topics   Alcohol use: Never   Drug use: Never    No Known Allergies  Current Outpatient Medications  Medication Sig Dispense Refill   amLODipine (NORVASC) 5 MG tablet TAKE 1 TABLET(5 MG) BY MOUTH EVERY EVENING 90 tablet 1   cholecalciferol (VITAMIN D3) 25 MCG (1000 UNIT) tablet Take 1,000 Units by mouth daily.     losartan (COZAAR) 25 MG tablet TAKE 1 TABLET(25 MG) BY MOUTH DAILY 90 tablet 1   metoprolol succinate (TOPROL-XL) 50 MG 24 hr tablet Take 50 mg by mouth daily. Take with or immediately following a meal.     Trospium Chloride 60 MG CP24 TAKE 1 CAPSULE(60 MG) BY MOUTH EVERY MORNING 90 capsule 1  vitamin C (ASCORBIC ACID) 500 MG tablet Take 500 mg by mouth daily.     No current facility-administered medications for this visit.     Review of Systems Full ROS  was asked and was negative except for the information on the HPI  Physical Exam Blood pressure 137/87, pulse 85, temperature 98.1 F (36.7 C), height '5\' 3"'$  (1.6 m), weight 157 lb (71.2 kg), SpO2 98 %. CONSTITUTIONAL: NAD. EYES: Pupils are equal, round, and reactive to light, Sclera are non-icteric. EARS, NOSE, MOUTH AND THROAT: The oropharynx is clear. The oral mucosa is pink and moist. Hearing is intact to voice. LYMPH NODES:  Lymph nodes in the neck are normal. RESPIRATORY:  Lungs are clear. There is normal respiratory effort, with equal breath sounds bilaterally, and without pathologic use of accessory muscles. CARDIOVASCULAR: Heart is regular without murmurs, gallops, or rubs. GI: The abdomen  is  soft, nontender, and nondistended. There are no palpable masses. There is no hepatosplenomegaly. There are normal bowel sounds in all quadrants. GU: Rectal deferred.   MUSCULOSKELETAL: Normal muscle strength and tone. No cyanosis or edema.   SKIN: Turgor is good and there are no pathologic skin lesions or ulcers. NEUROLOGIC: Motor and sensation is grossly normal. Cranial nerves are grossly intact. PSYCH:  Oriented to person, place and time. Affect is normal.  Data Reviewed  I have personally reviewed the patient's imaging, laboratory findings and medical records.    Assessment/Plan 82 year old female. 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  She does also have significant cardiac issues.  Make sure that she gets optimizedby cards.  I had an extensive discussion with the patient regarding her disease process.  I Do think that her disease is resectable, the final decision and assessment for appropriate staging is intra operative assessment.  I do think that given her good performance status is worth giving her the option of upfront surgery. I did discuss with the patient and the family regarding this option.  He will be open operation.  She wishes to think about it and the replacement in the next days and further please surgical intervention. Copy of the report sent to referring provider Please note that I spent greater than 60 minutes in this encounter including personally reviewing imaging studies, coordinating his care, placing orders and performing appropriate documentation. A copy of this report was sent to the referring provider   Caroleen Hamman, MD FACS General Surgeon 08/30/2022, 3:33 PM

## 2022-08-31 ENCOUNTER — Ambulatory Visit: Payer: Medicare Other | Admitting: Cardiology

## 2022-09-03 ENCOUNTER — Ambulatory Visit: Payer: Medicare Other | Attending: Cardiology | Admitting: Cardiology

## 2022-09-03 ENCOUNTER — Encounter: Payer: Self-pay | Admitting: Cardiology

## 2022-09-03 VITALS — BP 138/90 | HR 70 | Ht 63.0 in | Wt 159.1 lb

## 2022-09-03 DIAGNOSIS — I10 Essential (primary) hypertension: Secondary | ICD-10-CM

## 2022-09-03 DIAGNOSIS — Z01810 Encounter for preprocedural cardiovascular examination: Secondary | ICD-10-CM | POA: Diagnosis not present

## 2022-09-03 NOTE — Progress Notes (Signed)
Cardiology Office Note:    Date:  09/03/2022   ID:  Donna Leila, Ferguson Nov 29, 1940, MRN [id number]  PCP:  Burnard Hawthorne, FNP  Cardiologist:  Kate Sable, MD  Electrophysiologist:  None   Referring MD: Burnard Hawthorne, FNP   Chief Complaint  Patient presents with   Other    Cardiac clearance no complaints today. Meds reviewed verbally with pt.    History of Present Illness:    Donna Ferguson is a 82 y.o. female with a hx of hypertension, knee arthritis, trivial mitral valve insufficiency, who presents for preop evaluation.  Patient diagnosed with colon mass, lymphadenopathy suspicious for metastatic disease.  Abdominal surgery is being planned by surgery.  She did undergo right knee replacement 01/5992 without complications.  Denies chest pain or shortness of breath, last echo with normal EF.  Prior notes Echo 01/04/2020 EF 60 to 65%, impaired relaxation, trivial MR.   Past Medical History:  Diagnosis Date   Arthritis    Chronic kidney disease, stage 3a ([facility])    cyst near kidney   Constipation    Heart murmur    past   History of colonoscopy    2019 in [place]; patient states no longer screening for colon cancer.   Hypertension    IBS (irritable bowel syndrome)    Mitral valve insufficiency    Pneumonia    PONV (postoperative nausea and vomiting)    Pre-diabetes    Renal cyst    Right leg DVT ([facility])     Past Surgical History:  Procedure Laterality Date   ABDOMINAL HYSTERECTOMY     unsure if has ovaries.    BREAST EXCISIONAL BIOPSY Bilateral late 80s, early 90s    benign   BREAST SURGERY     COLONOSCOPY     COLONOSCOPY WITH PROPOFOL N/A 05/31/2022   Procedure: COLONOSCOPY WITH PROPOFOL;  Surgeon: Lin Landsman, MD;  Location: [facility] ENDOSCOPY;  Service: Gastroenterology;  Laterality: N/A;   cyst N/A    cyst removal both breast   JOINT REPLACEMENT     REPLACEMENT TOTAL KNEE Left 2017   TOTAL KNEE ARTHROPLASTY Right 12/28/2021   Procedure: TOTAL  KNEE ARTHROPLASTY;  Surgeon: Corky Mull, MD;  Location: [facility] ORS;  Service: Orthopedics;  Laterality: Right;    Current Medications: Current Meds  Medication Sig   amLODipine (NORVASC) 5 MG tablet TAKE 1 TABLET(5 MG) BY MOUTH EVERY EVENING   cholecalciferol (VITAMIN D3) 25 MCG (1000 UNIT) tablet Take 1,000 Units by mouth daily.   losartan (COZAAR) 25 MG tablet TAKE 1 TABLET(25 MG) BY MOUTH DAILY   metoprolol succinate (TOPROL-XL) 50 MG 24 hr tablet Take 50 mg by mouth daily. Take with or immediately following a meal.   Trospium Chloride 60 MG CP24 TAKE 1 CAPSULE(60 MG) BY MOUTH EVERY MORNING   vitamin C (ASCORBIC ACID) 500 MG tablet Take 500 mg by mouth daily.     Allergies:   Patient has no known allergies.   Social History   Socioeconomic History   Marital status: Married    Spouse name: Roosevelt   Number of children: 2   Years of education: Not on file   Highest education level: Not on file  Occupational History   Not on file  Tobacco Use   Smoking status: Never    Passive exposure: Past   Smokeless tobacco: Never  Vaping Use   Vaping Use: Never used  Substance and Sexual Activity   Alcohol use: Never   Drug use:  Never   Sexual activity: Yes    Birth control/protection: Surgical  Other Topics Concern   Not on file  [place] in [place]- Retired   2 children son & daughter   Married   From [place] by way of [place]   Retired 2011.    Enjoys painting   Social Determinants of Health   Financial Resource Strain: Low Risk  (08/06/2022)   Overall Financial Resource Strain (CARDIA)    Difficulty of Paying Living Expenses: Not hard at all  Food Insecurity: No Food Insecurity (08/06/2022)   Hunger Vital Sign    Worried About Running Out of Food in the Last Year: Never true    Ran Out of Food in the Last Year: Never true  Transportation Needs: No Transportation Needs (08/06/2022)   PRAPARE -  Hydrologist (Medical): No    Lack of Transportation (Non-Medical): No  Physical Activity: Not on file  Stress: No Stress Concern Present (08/06/2022)   [place]    Feeling of Stress : Not at all  Social Connections: Unknown (08/06/2022)   Social Connection and Isolation Panel [NHANES]    Frequency of Communication with Friends and Family: Not on file    Frequency of Social Gatherings with Friends and Family: Not on file    Attends Religious Services: Not on file    Active Member of Clubs or Organizations: Not on file    Attends Archivist Meetings: Not on file    Marital Status: Married     Family History: The patient's family history includes Diabetes in her father; Heart disease in her father; Hypertension in her daughter, father, mother, and sister; Throat cancer in her sister. There is no history of Breast cancer.  ROS:   Please see the history of present illness.     All other systems reviewed and are negative.   EKGs/Labs/Other Studies Reviewed:    The following studies were reviewed today:   EKG:  EKG is  ordered today.  The ekg ordered today demonstrates sinus rhythm, normal ECG.  Recent Labs: 09/18/2021: TSH 1.04 05/03/2022: ALT 11; BUN 19; Creatinine, Ser 0.93; Hemoglobin 13.2; Platelets 331; Potassium 4.0; Sodium 138  Recent Lipid Panel    Component Value Date/Time   CHOL 180 09/18/2021 0929   TRIG 77.0 09/18/2021 0929   HDL 70.60 09/18/2021 0929   CHOLHDL 3 09/18/2021 0929   VLDL 15.4 09/18/2021 0929   LDLCALC 94 09/18/2021 0929    Physical Exam:    VS:  BP (!) 138/90 (BP Location: Left Arm, Patient Position: Sitting, Cuff Size: Normal)   Pulse 70   Ht '5\' 3"'$  (1.6 m)   Wt 159 lb 2 oz (72.2 kg)   SpO2 94%   BMI 28.19 kg/m     Wt Readings from Last 3 Encounters:  09/03/22 159 lb 2 oz (72.2 kg)  08/27/22 157 lb (71.2 kg)  08/22/22 157 lb 12.8 oz  (71.6 kg)     GEN:  Well nourished, well developed in no acute distress HEENT: Normal NECK: No JVD; No carotid bruits CARDIAC: RRR, no murmurs, rubs, gallops RESPIRATORY:  Clear to auscultation without rales, wheezing or rhonchi  ABDOMEN: Soft, non-tender, non-distended MUSCULOSKELETAL:  No edema; No deformity  SKIN: Warm and dry NEUROLOGIC:  Alert and oriented x 3 PSYCHIATRIC:  Normal affect   ASSESSMENT:    1. Pre-operative  cardiovascular examination   2. Primary hypertension    PLAN:    In order of problems listed above:  Preop evaluation, abdominal mass resection being considered.  Echo with normal EF, denies chest pain or shortness of breath.  Able to perform over 4 METS without any cardiac symptoms.  EKG normal.  Okay to proceed with abdominal surgery from a cardiac perspective.  No additional cardiac testing indicated.  Tolerated recent knee surgery without any adverse effects. Hypertension, BP slightly elevated, usually controlled.  Continue losartan, amlodipine.  Follow-up in 6 months.   Medication Adjustments/Labs and Tests Ordered: Current medicines are reviewed at length with the patient today.  Concerns regarding medicines are outlined above.  Orders Placed This Encounter  Procedures   EKG 12-Lead    No orders of the defined types were placed in this encounter.    Patient Instructions  Medication Instructions:   Your physician recommends that you continue on your current medications as directed. Please refer to the Current Medication list given to you today.  *If you need a refill on your cardiac medications before your next appointment, please call your pharmacy*   Lab Work:  None Ordered  If you have labs (blood work) drawn today and your tests are completely normal, you will receive your results only by: [place] (if you have MyChart) OR A paper copy in the mail If you have any lab test that is abnormal or we need to change your treatment,  we will call you to review the results.   Testing/Procedures:  None Ordered   Follow-Up: At [facility], you and your health needs are our priority.  As part of our continuing mission to provide you with exceptional heart care, we have created designated Provider Care Teams.  These Care Teams include your primary Cardiologist (physician) and Advanced Practice Providers (APPs -  Physician Assistants and Nurse Practitioners) who all work together to provide you with the care you need, when you need it.  We recommend signing up for the patient portal called "MyChart".  Sign up information is provided on this After Visit Summary.  MyChart is used to connect with patients for Virtual Visits (Telemedicine).  Patients are able to view lab/test results, encounter notes, upcoming appointments, etc.  Non-urgent messages can be sent to your provider as well.   To learn more about what you can do with MyChart, go to [email or web address].    Your next appointment:   6 month(s)  Provider:   You may see Kate Sable, MD or one of the following Advanced Practice Providers on your designated Care Team:   Murray Hodgkins, NP Christell Faith, PA-C Cadence Kathlen Mody, PA-C Gerrie Nordmann, NP    Signed, Kate Sable, MD  09/03/2022 3:34 PM    [place]

## 2022-09-03 NOTE — Patient Instructions (Signed)
Medication Instructions:   Your physician recommends that you continue on your current medications as directed. Please refer to the Current Medication list given to you today.  *If you need a refill on your cardiac medications before your next appointment, please call your pharmacy*   Lab Work:  None Ordered  If you have labs (blood work) drawn today and your tests are completely normal, you will receive your results only by: MyChart Message (if you have MyChart) OR A paper copy in the mail If you have any lab test that is abnormal or we need to change your treatment, we will call you to review the results.   Testing/Procedures:  None Ordered   Follow-Up: At [facility], you and your health needs are our priority.  As part of our continuing mission to provide you with exceptional heart care, we have created designated Provider Care Teams.  These Care Teams include your primary Cardiologist (physician) and Advanced Practice Providers (APPs -  Physician Assistants and Nurse Practitioners) who all work together to provide you with the care you need, when you need it.  We recommend signing up for the patient portal called "MyChart".  Sign up information is provided on this After Visit Summary.  MyChart is used to connect with patients for Virtual Visits (Telemedicine).  Patients are able to view lab/test results, encounter notes, upcoming appointments, etc.  Non-urgent messages can be sent to your provider as well.   To learn more about what you can do with MyChart, go to [email or web address].    Your next appointment:   6 month(s)  Provider:   You may see Brian Agbor-Etang, MD or one of the following Advanced Practice Providers on your designated Care Team:   Christopher Berge, NP Ryan Dunn, PA-C Cadence Furth, PA-C Sheri Hammock, NP 

## 2022-09-10 ENCOUNTER — Encounter: Payer: Self-pay | Admitting: Surgery

## 2022-09-10 ENCOUNTER — Telehealth: Payer: Self-pay

## 2022-09-10 ENCOUNTER — Telehealth: Payer: Self-pay | Admitting: Surgery

## 2022-09-10 ENCOUNTER — Ambulatory Visit (INDEPENDENT_AMBULATORY_CARE_PROVIDER_SITE_OTHER): Payer: Medicare Other | Admitting: Surgery

## 2022-09-10 ENCOUNTER — Telehealth: Payer: Self-pay | Admitting: Cardiology

## 2022-09-10 VITALS — BP 160/84 | HR 75 | Temp 98.3°F | Ht 63.0 in | Wt 155.4 lb

## 2022-09-10 DIAGNOSIS — C7A8 Other malignant neuroendocrine tumors: Secondary | ICD-10-CM | POA: Diagnosis not present

## 2022-09-10 MED ORDER — BISACODYL 5 MG PO TBEC: DELAYED_RELEASE_TABLET | ORAL | 0 refills | Status: DC

## 2022-09-10 MED ORDER — NEOMYCIN SULFATE 500 MG PO TABS: ORAL_TABLET | ORAL | 0 refills | Status: DC

## 2022-09-10 MED ORDER — METRONIDAZOLE 500 MG PO TABS: ORAL_TABLET | ORAL | 0 refills | Status: DC

## 2022-09-10 MED ORDER — POLYETHYLENE GLYCOL 3350 17 GM/SCOOP PO POWD: ORAL | 0 refills | Status: DC

## 2022-09-10 NOTE — Patient Instructions (Addendum)
Please pick up your prescriptions from your pharmacy.    Our surgery scheduler Pamala Hurry will call you within 24-48 hours to get you scheduled. If you have not heard from her after 48 hours, please call our office. Have the blue sheet available when she calls to write down important information.   If you have any concerns or questions, please feel free to call our office.   Open Small Bowel Resection  The small bowel, also called the small intestine, is the first part of the intestines. When food leaves the stomach, it enters the small bowel. Nutrients can then be absorbed into the body. Open small bowel resection is surgery to remove part of the small bowel. You may need this procedure if you have: A blockage (obstruction). Inflammatory bowel disease, such as Crohn's disease. Cancer. A hole in the bowel (perforation). An injury to the bowel (trauma). Tell a health care provider about: Any allergies you have. All medicines you are taking, including vitamins, herbs, eye drops, creams, and over-the-counter medicines. Any problems you or family members have had with anesthesia. Any bleeding problems you have. Any surgeries you have had. Any medical conditions you have. Whether you are pregnant or may be pregnant. What are the risks? Your health care provider will talk with you about risks. These may include: Bleeding. Infection. Allergic reactions to medicines. Damage to nearby structures or organs. Problems with the bowel, such as: Fluids leaking from the intestines into the abdomen. A long delay before the bowels start to work again (ileus). Not absorbing enough vitamins and nutrition through the small bowel. Short bowel syndrome. This is when there is not enough of the small bowel left to absorb nutrients. Pulmonary embolism. This is a blood clot that forms in the veins and travels to the lungs. Hernia. This is when the abdomen bulges out. It may require surgery in the  future. Scarring around an incision or inside your body around the intestines. If scarring occurs inside the body, you may need surgery. What happens before the procedure? When to stop eating and drinking Follow instructions from your health care provider about what you may eat and drink. These may include: 8 hours before your procedure Stop eating most foods. Do not eat meat, fried foods, or fatty foods. Eat only light foods, such as toast or crackers. All liquids are okay except energy drinks and alcohol. 6 hours before your procedure Stop eating. Drink only clear liquids, such as water, clear fruit juice, black coffee, plain tea, and sports drinks. Do not drink energy drinks or alcohol. 2 hours before your procedure Stop drinking all liquids. You may be allowed to take medicines with small sips of water. If you do not follow your health care provider's instructions, your procedure may be delayed or canceled. Medicines Ask your health care provider about: Changing or stopping your regular medicines. These include any diabetes medicines or blood thinners you take. Taking medicines such as aspirin and ibuprofen. These medicines can thin your blood. Do not take them unless your health care provider tells you to. Taking over-the-counter medicines, vitamins, herbs, and supplements. You may need to take medicine to clean out your bowels before surgery (bowel prep). Take the medicine as told by your health care provider. Tests You may have an exam or testing. Tests may include: Blood tests. Imaging tests, such as X-rays, CT scan, or MRI. Surgery safety Ask your health care provider: How your surgery site will be marked. What steps will be taken to  help prevent infection. These steps may include: Removing hair at the surgery site. Washing skin with a soap that kills germs. Taking antibiotics. General instructions Do not use any products that contain nicotine or tobacco for at least 4  weeks before the procedure. These products include cigarettes, chewing tobacco, and vaping devices, such as e-cigarettes. If you need help quitting, ask your health care provider. Plan to have a responsible adult: Take you home from the hospital. You will not be allowed to drive. Care for you for the time you are told. What happens during the procedure?  An IV will be inserted into one of your veins. You may be given: A sedative. This helps you relax. Anesthesia. This will: Numb certain areas of your body. Make you fall asleep for surgery. Tubes may be put into your body, such as: A tube in your throat to help you breathe. A nasogastric tube (NG tube). This goes through your nose and into your stomach. Fluids from your stomach will drain through this tube during and after the procedure. A small, thin tube (catheter) put into your bladder to drain urine. An incision will be made in the middle of your abdomen. The affected part of the small bowel will be removed. The two healthy ends of the small bowel will be connected with staples or stitches (sutures). If the ends cannot be joined, you may need an ileostomy. This is when an opening (stoma) is made in your abdomen for stool to pass through into a pouch outside of the body. The incision will be closed with sutures, skin glue, or adhesive strips and covered with a bandage (dressing). The procedure may vary among health care providers and hospitals. What happens after the procedure? Your blood pressure, heart rate, breathing rate, and blood oxygen level will be monitored until you leave the hospital. You may be given fluids and medicine through an IV. The NG tube will be removed after your bowels start working. When it is removed, you can start to have liquids and then slowly move to eating more solid foods. You will be asked to get up and start walking within a day. This helps prevent blood clots in your legs. To help prevent breathing  problems: You may be told to breathe deeply and to cough a few times a day. You may be given a device to help you do breathing exercises (incentive spirometer) . You may need to wear compression stockings. These help prevent blood clots and reduce swelling in your legs. This information is not intended to replace advice given to you by your health care provider. Make sure you discuss any questions you have with your health care provider. Document Revised: 01/05/2022 Document Reviewed: 01/05/2022 Elsevier Patient Education  [place].

## 2022-09-10 NOTE — Telephone Encounter (Signed)
Faxed Cardiac clearance to Dr. Garen Lah at [phone].

## 2022-09-10 NOTE — Telephone Encounter (Signed)
   Patient Name: Donna Ferguson  DOB: Feb 26, 1941 MRN: [id number]  Primary Cardiologist: Kate Sable, MD  Chart reviewed as part of pre-operative protocol coverage. Given past medical history and time since last visit, based on ACC/AHA guidelines, [name] is at acceptable risk for the planned procedure without further cardiovascular testing.   Patient was cleared for surgery at last OV on 09/03/2022 with Dr. Garen Lah.    I will route this recommendation as well as the note from most recent OV with Dr. Garen Lah to the requesting party via [facility] fax function and remove from pre-op pool.  Please call with questions.  [name], NP 09/10/2022, 4:16 PM

## 2022-09-10 NOTE — Telephone Encounter (Signed)
   Pre-operative Risk Assessment    Patient Name: Donna Ferguson  DOB: [birth year] MRN: [id number]      Request for Surgical Clearance    Procedure:  Small Bowel Resection  Date of Surgery:  Clearance 10/02/22                                 Surgeon:  Dr. Caroleen Hamman Surgeon's Group or Practice Name:  [facility] Phone number: [phone] Fax number:  [phone]   Type of Clearance Requested:   - Medical    Type of Anesthesia:  General    Additional requests/questions:    Signed, Maxwell Caul   09/10/2022, 4:01 PM

## 2022-09-10 NOTE — Telephone Encounter (Signed)
Patient has been advised of Pre-Admission date/time, and Surgery date at [facility].  Surgery Date: 10/02/22 Preadmission Testing Date: 09/24/22 (phone 1p-5p)  Patient has been made aware to call [phone], between 1-3:00pm the day before surgery, to find out what time to arrive for surgery.

## 2022-09-11 NOTE — Progress Notes (Signed)
  Surgical Consultation  09/11/2022  Donna Ferguson is an 82 y.o. female.   Chief Complaint  Patient presents with  . Follow-up    neuroendocrine tumors of intestine     HPI: ***  Past Medical History:  Diagnosis Date  . Arthritis   . Chronic kidney disease, stage 3a ([place])    cyst near kidney  . Constipation   . Heart murmur    past  . History of colonoscopy    2019 in [place]; patient states no longer screening for colon cancer.  . Hypertension   . IBS (irritable bowel syndrome)   . Mitral valve insufficiency   . Pneumonia   . PONV (postoperative nausea and vomiting)   . Pre-diabetes   . Renal cyst   . Right leg DVT [facility])     Past Surgical History:  Procedure Laterality Date  . ABDOMINAL HYSTERECTOMY     unsure if has ovaries.   [facility] Kitchen BREAST EXCISIONAL BIOPSY Bilateral late 80s, early 90s    benign  . BREAST SURGERY    . COLONOSCOPY    . COLONOSCOPY WITH PROPOFOL N/A 05/31/2022   Procedure: COLONOSCOPY WITH PROPOFOL;  Surgeon: Lin Landsman, MD;  Location: [facility] ENDOSCOPY;  Service: Gastroenterology;  Laterality: N/A;  . cyst N/A    cyst removal both breast  . JOINT REPLACEMENT    . REPLACEMENT TOTAL KNEE Left 2017  . TOTAL KNEE ARTHROPLASTY Right 12/28/2021   Procedure: TOTAL KNEE ARTHROPLASTY;  Surgeon: Corky Mull, MD;  Location: [facility] ORS;  Service: Orthopedics;  Laterality: Right;    Family History  Problem Relation Age of Onset  . Hypertension Mother   . Diabetes Father   . Hypertension Father   . Heart disease Father   . Hypertension Sister   . Throat cancer Sister   . Hypertension Daughter   . Breast cancer Neg Hx     Social History:  reports that she has never smoked. She has been exposed to tobacco smoke. She has never used smokeless tobacco. She reports that she does not drink alcohol and does not use drugs.  Allergies: No Known Allergies  Medications reviewed.     ROS Full ROS performed and is otherwise negative other than what is  stated in the HPI    BP (!) 160/84   Pulse 75   Temp 98.3 F (36.8 C) (Oral)   Ht '5\' 3"'$  (1.6 m)   Wt 155 lb 6.4 oz (70.5 kg)   SpO2 99%   BMI 27.53 kg/m   Physical Exam    No results found for this or any previous visit (from the past 48 hour(s)). No results found.  Assessment/Plan: There are no diagnoses linked to this encounter.  Caroleen Hamman, MD [facility] General Surgeon

## 2022-09-11 NOTE — Progress Notes (Unsigned)
Received Cardiology Clearance from Diona Browner, NP at [facility]. The patient is at acceptable risk for surgery without any further cardiovascular testing. All notes are in Epic.

## 2022-09-11 NOTE — H&P (View-Only) (Signed)
Surgical Consultation  09/11/2022  Donna Ferguson is an 82 y.o. female.   Chief Complaint  Patient presents with   Follow-up    neuroendocrine tumors of intestine     HPI:  Donna Ferguson is a 82 y.o. female seen in F/U for neuroendocrine SB tumor.She does have some intermittent abdominal discomfort / pain, mild, colicky.  No other specific alleviating or aggravating factors.  She did have pain.  She did CTs of the abdomen pelvis personally reviewed as well DOTATATE scan , showin mass TI and several other active small bowel lesions c/w Multifocal neuroendocrine tumor of the SB. Colonoscopy showed small polyps, no masses.  She does have a hx  She did have a Right knee replacement on 12/2021 and is recovering well. No complications. CMP, CBC and CEA nml She is able to perform more than 4 METS w/o sob or c/p Hx DVT She has been seen cards and now is ready to move forward w the operation. Husband has recovered from recent ortho surgery    Past Medical History:  Diagnosis Date   Arthritis    Chronic kidney disease, stage 3a ([place])    cyst near kidney   Constipation    Heart murmur    past   History of colonoscopy    2019 in [place]; patient states no longer screening for colon cancer.   Hypertension    IBS (irritable bowel syndrome)    Mitral valve insufficiency    Pneumonia    PONV (postoperative nausea and vomiting)    Pre-diabetes    Renal cyst    Right leg DVT ([facility])     Past Surgical History:  Procedure Laterality Date   ABDOMINAL HYSTERECTOMY     unsure if has ovaries.    BREAST EXCISIONAL BIOPSY Bilateral late 80s, early 90s    benign   BREAST SURGERY     COLONOSCOPY     COLONOSCOPY WITH PROPOFOL N/A 05/31/2022   Procedure: COLONOSCOPY WITH PROPOFOL;  Surgeon: Lin Landsman, MD;  Location: [facility] ENDOSCOPY;  Service: Gastroenterology;  Laterality: N/A;   cyst N/A    cyst removal both breast   JOINT REPLACEMENT     REPLACEMENT TOTAL KNEE Left 2017   TOTAL KNEE  ARTHROPLASTY Right 12/28/2021   Procedure: TOTAL KNEE ARTHROPLASTY;  Surgeon: Corky Mull, MD;  Location: [facility] ORS;  Service: Orthopedics;  Laterality: Right;    Family History  Problem Relation Age of Onset   Hypertension Mother    Diabetes Father    Hypertension Father    Heart disease Father    Hypertension Sister    Throat cancer Sister    Hypertension Daughter    Breast cancer Neg Hx     Social History:  reports that she has never smoked. She has been exposed to tobacco smoke. She has never used smokeless tobacco. She reports that she does not drink alcohol and does not use drugs.  Allergies: No Known Allergies  Medications reviewed.     ROS Full ROS performed and is otherwise negative other than what is stated in the HPI    BP (!) 160/84   Pulse 75   Temp 98.3 F (36.8 C) (Oral)   Ht 5' 3"$  (1.6 m)   Wt 155 lb 6.4 oz (70.5 kg)   SpO2 99%   BMI 27.53 kg/m   Physical Exam CONSTITUTIONAL: NAD. EYES: Pupils are equal, round, and reactive to light, Sclera are non-icteric. EARS, NOSE, MOUTH AND THROAT: The oropharynx is clear.  The oral mucosa is pink and moist. Hearing is intact to voice. LYMPH NODES:  Lymph nodes in the neck are normal. RESPIRATORY:  Lungs are clear. There is normal respiratory effort, with equal breath sounds bilaterally, and without pathologic use of accessory muscles. CARDIOVASCULAR: Heart is regular without murmurs, gallops, or rubs. GI: The abdomen is  soft, nontender, and nondistended. There are no palpable masses. There is no hepatosplenomegaly. There are normal bowel sounds in all quadrants. GU: Rectal deferred.   MUSCULOSKELETAL: Normal muscle strength and tone. No cyanosis or edema.   SKIN: Turgor is good and there are no pathologic skin lesions or ulcers. NEUROLOGIC: Motor and sensation is grossly normal. Cranial nerves are grossly intact. PSYCH:  Oriented to person, place and time. Affect is normal.  Assessment/Plan: 82 year old  female. 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  She does also have significant cardiac issues.    I had an extensive discussion with the patient regarding her disease process.  I Do think that her disease is resectable, the final decision and assessment for appropriate staging is intra operative assessment.  I do think that given her good performance status is worth giving her the option of upfront surgery. I did discuss with the patient and the family regarding this option.  It will be an open operation.    Procedure discussed with the patient and the family in detail.  Risk, benefits and possible complications including but not limited to: Bleeding, infection possibility of right colectomy and also ostomy.  Hospitalization all very potential risks such as bowel injuries and prolonged hospitalization.  She understands and wished to proceed  Please note that I spent greater than 40 minutes in this encounter including personally reviewing imaging studies, coordinating his care, placing orders and performing appropriate documentation. A copy of this report was sent to the referring provider   Caroleen Hamman, MD [facility] General Surgeon

## 2022-09-24 ENCOUNTER — Other Ambulatory Visit: Payer: Self-pay

## 2022-09-24 ENCOUNTER — Encounter
Admission: RE | Admit: 2022-09-24 | Discharge: 2022-09-24 | Disposition: A | Discharge status: Home / Self Care | Payer: Medicare Other | Source: Ambulatory Visit | Attending: Surgery | Admitting: Surgery

## 2022-09-24 VITALS — Ht 63.0 in | Wt 152.0 lb

## 2022-09-24 DIAGNOSIS — Z01812 Encounter for preprocedural laboratory examination: Secondary | ICD-10-CM

## 2022-09-24 HISTORY — DX: Dyspnea, unspecified: R06.00

## 2022-09-24 NOTE — Patient Instructions (Addendum)
Your procedure is scheduled on: Tuesday October 02, 2022. Report to Day Surgery inside [facility] 2nd floor, stop by registration desk before getting on elevator.  To find out your arrival time please call [phone] between 1PM - 3PM on Monday October 01, 2022.  Remember: Instructions that are not followed completely may result in serious medical risk,  up to and including death, or upon the discretion of your surgeon and anesthesiologist your  surgery may need to be rescheduled.     _X__ 1. Follow your doctor's instructions for Prep before your procedure.                 No chewing gum or hard candies. You may drink clear liquids up to 2 hours                 before you are scheduled to arrive for your surgery- DO not drink clear                 liquids within 2 hours of the start of your surgery.                 Clear Liquids include:  water, apple juice without pulp, Black Coffee or Tea (Do not add                 anything to coffee or tea).  __X__2.  On the morning of surgery brush your teeth with toothpaste and water, you                may rinse your mouth with mouthwash if you wish.  Do not swallow any toothpaste or mouthwash.     _X__ 3.  No Alcohol for 24 hours before or after surgery.   _X__ 4.  Do Not Smoke or use e-cigarettes For 24 Hours Prior to Your Surgery.                 Do not use any chewable tobacco products for at least 6 hours prior to                 Surgery.  _X__  5.  Do not use any recreational drugs (marijuana, cocaine, heroin, ecstasy, MDMA or other)                For at least one week prior to your surgery.  Combination of these drugs with anesthesia                May have life threatening results.  ____  6.  Bring all medications with you on the day of surgery if instructed.   __X_ 7.  Notify your doctor if there is any change in your medical condition      (cold, fever, infections).     Do not wear jewelry, make-up,  hairpins, clips or nail polish. Do not wear lotions, powders, or perfumes. You may wear deodorant. Do not shave 48 hours prior to surgery. Men may shave face and neck. Do not bring valuables to the hospital.    [facility] is not responsible for any belongings or valuables.  Contacts, dentures or bridgework may not be worn into surgery. Leave your suitcase in the car. After surgery it may be brought to your room. For patients admitted to the hospital, discharge time is determined by your treatment team.   Patients discharged the day of surgery will not be allowed to drive home.   Make arrangements for someone to be  with you for the first 24 hours of your Same Day Discharge.   __X__ Take these medicines the morning of surgery with A SIP OF WATER: Also    1. metoprolol succinate (TOPROL-XL) 50 MG  2.    3.   4.  5.  6.  ____ Fleet Enema (as directed)   __X__ Use CHG Soap (or wipes) as directed  ____ Use Benzoyl Peroxide Gel as instructed  ____ Use inhalers on the day of surgery  ____ Stop metformin 2 days prior to surgery    ____ Take 1/2 of usual insulin dose the night before surgery. No insulin the morning          of surgery.   ____ Call your PCP, cardiologist, or Pulmonologist if taking Coumadin/Plavix/aspirin and ask when to stop before your surgery.   __X__ One Week prior to surgery- Stop Anti-inflammatories such as Ibuprofen, Aleve, Advil, Motrin, meloxicam (MOBIC), diclofenac, etodolac, ketorolac, Toradol, Daypro, piroxicam, Goody's or BC powders. OK TO USE TYLENOL IF NEEDED   __X__ One week prior to surgery stop ALL supplements until after surgery. vitamin C (ASCORBIC ACID) 500 MG and cholecalciferol (VITAMIN D3)   ____ Bring C-Pap to the hospital.    If you have any questions regarding your pre-procedure instructions,  Please call Pre-admit Testing at [phone]    Preparing for Surgery with CHLORHEXIDINE GLUCONATE (CHG) Soap  Chlorhexidine Gluconate  (CHG) Soap  o An antiseptic cleaner that kills germs and bonds with the skin to continue killing germs even after washing  o Used for showering the night before surgery and morning of surgery  Before surgery, you can play an important role by reducing the number of germs on your skin.  CHG (Chlorhexidine gluconate) soap is an antiseptic cleanser which kills germs and bonds with the skin to continue killing germs even after washing.  Please do not use if you have an allergy to CHG or antibacterial soaps. If your skin becomes reddened/irritated stop using the CHG.  1. Shower the NIGHT BEFORE SURGERY and the MORNING OF SURGERY with CHG soap.  2. If you choose to wash your hair, wash your hair first as usual with your normal shampoo.  3. After shampooing, rinse your hair and body thoroughly to remove the shampoo.  4. Use CHG as you would any other liquid soap. You can apply CHG directly to the skin and wash gently with a scrungie or a clean washcloth.  5. Apply the CHG soap to your body only from the neck down. Do not use on open wounds or open sores. Avoid contact with your eyes, ears, mouth, and genitals (private parts). Wash face and genitals (private parts) with your normal soap.  6. Wash thoroughly, paying special attention to the area where your surgery will be performed.  7. Thoroughly rinse your body with warm water.  8. Do not shower/wash with your normal soap after using and rinsing off the CHG soap.  9. Pat yourself dry with a clean towel.  10. Wear clean pajamas to bed the night before surgery.  12. Place clean sheets on your bed the night of your first shower and do not sleep with pets.  13. Shower again with the CHG soap on the day of surgery prior to arriving at the hospital.  14. Do not apply any deodorants/lotions/powders.  15. Please wear clean clothes to the hospital.

## 2022-09-25 ENCOUNTER — Encounter
Admission: RE | Admit: 2022-09-25 | Discharge: 2022-09-25 | Disposition: A | Discharge status: Home / Self Care | Payer: Medicare Other | Source: Ambulatory Visit | Attending: Surgery | Admitting: Surgery

## 2022-09-25 ENCOUNTER — Encounter: Payer: Self-pay | Admitting: Surgery

## 2022-09-25 DIAGNOSIS — Z01812 Encounter for preprocedural laboratory examination: Secondary | ICD-10-CM | POA: Insufficient documentation

## 2022-09-25 LAB — TYPE AND SCREEN
ABO/RH(D): O POS
Antibody Screen: NEGATIVE

## 2022-10-01 MED ORDER — CHLORHEXIDINE GLUCONATE 0.12 % MT SOLN: 15.0000 mL | Freq: Once | OROMUCOSAL | Status: AC

## 2022-10-01 MED ORDER — ORAL CARE MOUTH RINSE: 15.0000 mL | Freq: Once | OROMUCOSAL | Status: AC

## 2022-10-01 MED ORDER — CHLORHEXIDINE GLUCONATE CLOTH 2 % EX PADS
6.0000 | MEDICATED_PAD | Freq: Once | CUTANEOUS | Status: AC
  Administered 2022-10-02: 6 via TOPICAL

## 2022-10-01 MED ORDER — ACETAMINOPHEN 500 MG PO TABS: 1000.0000 mg | ORAL_TABLET | ORAL | Status: AC

## 2022-10-01 MED ORDER — ALVIMOPAN 12 MG PO CAPS: 12.0000 mg | ORAL_CAPSULE | ORAL | Status: AC

## 2022-10-01 MED ORDER — HEPARIN SODIUM (PORCINE) 5000 UNIT/ML IJ SOLN: 5000.0000 [IU] | Freq: Once | INTRAMUSCULAR | Status: AC

## 2022-10-01 MED ORDER — GABAPENTIN 300 MG PO CAPS: 300.0000 mg | ORAL_CAPSULE | ORAL | Status: AC

## 2022-10-01 MED ORDER — CELECOXIB 200 MG PO CAPS: 200.0000 mg | ORAL_CAPSULE | ORAL | Status: AC

## 2022-10-01 MED ORDER — SODIUM CHLORIDE 0.9 % IV SOLN: INTRAVENOUS | Status: DC

## 2022-10-01 MED ORDER — SODIUM CHLORIDE 0.9 % IV SOLN
2.0000 g | INTRAVENOUS | Status: AC
  Administered 2022-10-02: 2 g via INTRAVENOUS

## 2022-10-01 MED ORDER — FAMOTIDINE 20 MG PO TABS: 20.0000 mg | ORAL_TABLET | Freq: Once | ORAL | Status: AC

## 2022-10-02 ENCOUNTER — Inpatient Hospital Stay: Payer: Medicare Other | Admitting: Urgent Care

## 2022-10-02 ENCOUNTER — Other Ambulatory Visit: Payer: Self-pay

## 2022-10-02 ENCOUNTER — Encounter: Payer: Self-pay | Admitting: Surgery

## 2022-10-02 ENCOUNTER — Inpatient Hospital Stay
Admission: RE | Admit: 2022-10-02 | Discharge: 2022-10-04 | DRG: 830 | Disposition: A | Discharge status: Home / Self Care | Payer: Medicare Other | Attending: Surgery | Admitting: Surgery

## 2022-10-02 ENCOUNTER — Encounter
Admission: RE | Disposition: A | Discharge status: Home / Self Care | Payer: Self-pay | Source: Home / Self Care | Attending: Surgery

## 2022-10-02 DIAGNOSIS — M1990 Unspecified osteoarthritis, unspecified site: Secondary | ICD-10-CM | POA: Diagnosis present

## 2022-10-02 DIAGNOSIS — Z86718 Personal history of other venous thrombosis and embolism: Secondary | ICD-10-CM | POA: Diagnosis not present

## 2022-10-02 DIAGNOSIS — Z8249 Family history of ischemic heart disease and other diseases of the circulatory system: Secondary | ICD-10-CM | POA: Diagnosis not present

## 2022-10-02 DIAGNOSIS — R7303 Prediabetes: Secondary | ICD-10-CM | POA: Diagnosis present

## 2022-10-02 DIAGNOSIS — N1831 Chronic kidney disease, stage 3a: Secondary | ICD-10-CM | POA: Diagnosis present

## 2022-10-02 DIAGNOSIS — D72829 Elevated white blood cell count, unspecified: Secondary | ICD-10-CM | POA: Diagnosis present

## 2022-10-02 DIAGNOSIS — Z833 Family history of diabetes mellitus: Secondary | ICD-10-CM | POA: Diagnosis not present

## 2022-10-02 DIAGNOSIS — I129 Hypertensive chronic kidney disease with stage 1 through stage 4 chronic kidney disease, or unspecified chronic kidney disease: Secondary | ICD-10-CM | POA: Diagnosis present

## 2022-10-02 DIAGNOSIS — K589 Irritable 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 diarrhea: Secondary | ICD-10-CM | POA: Diagnosis present

## 2022-10-02 DIAGNOSIS — Z96653 Presence of artificial knee joint, bilateral: Secondary | ICD-10-CM | POA: Diagnosis present

## 2022-10-02 DIAGNOSIS — Z808 Family history of malignant neoplasm of other organs or systems: Secondary | ICD-10-CM | POA: Diagnosis not present

## 2022-10-02 DIAGNOSIS — C772 Secondary and unspecified malignant neoplasm of intra-abdominal lymph nodes: Secondary | ICD-10-CM | POA: Diagnosis present

## 2022-10-02 DIAGNOSIS — D3A019 Benign carcinoid tumor of the small intestine, unspecified portion: Secondary | ICD-10-CM | POA: Diagnosis present

## 2022-10-02 DIAGNOSIS — C7A8 Other malignant neuroendocrine tumors: Secondary | ICD-10-CM | POA: Diagnosis present

## 2022-10-02 HISTORY — PX: BOWEL RESECTION: SHX1257

## 2022-10-02 HISTORY — DX: Atherosclerosis of aorta: I70.0

## 2022-10-02 HISTORY — DX: Low back pain, unspecified: M54.50

## 2022-10-02 HISTORY — DX: Diverticulosis of intestine, part unspecified, without perforation or abscess without bleeding: K57.90

## 2022-10-02 LAB — CREATININE, SERUM
Creatinine, Ser: 1.14 mg/dL — ABNORMAL HIGH (ref 0.44–1.00)
GFR, Estimated: 49 mL/min — ABNORMAL LOW

## 2022-10-02 LAB — CBC
HCT: 45.4 % (ref 36.0–46.0)
Hemoglobin: 14.7 g/dL (ref 12.0–15.0)
MCH: 28.4 pg (ref 26.0–34.0)
MCHC: 32.4 g/dL (ref 30.0–36.0)
MCV: 87.6 fL (ref 80.0–100.0)
Platelets: 266 10*3/uL (ref 150–400)
RBC: 5.18 MIL/uL — ABNORMAL HIGH (ref 3.87–5.11)
RDW: 13.6 % (ref 11.5–15.5)
WBC: 15.4 10*3/uL — ABNORMAL HIGH (ref 4.0–10.5)
nRBC: 0 % (ref 0.0–0.2)

## 2022-10-02 SURGERY — EXCISION, SMALL INTESTINE
Anesthesia: General

## 2022-10-02 MED ORDER — LACTATED RINGERS IV SOLN: INTRAVENOUS | Status: DC | PRN

## 2022-10-02 MED ORDER — ACETAMINOPHEN 500 MG PO TABS
1000.0000 mg | ORAL_TABLET | Freq: Four times a day (QID) | ORAL | Status: DC
  Administered 2022-10-02 – 2022-10-04 (×7): 1000 mg via ORAL
  Filled 2022-10-02 (×8): qty 2

## 2022-10-02 MED ORDER — OXYCODONE HCL 5 MG PO TABS
5.0000 mg | ORAL_TABLET | Freq: Once | ORAL | Status: AC | PRN
  Administered 2022-10-02: 5 mg via ORAL

## 2022-10-02 MED ORDER — FENTANYL CITRATE (PF) 100 MCG/2ML IJ SOLN
INTRAMUSCULAR | Status: AC
  Filled 2022-10-02: qty 2

## 2022-10-02 MED ORDER — ALVIMOPAN 12 MG PO CAPS
ORAL_CAPSULE | ORAL | Status: AC
  Administered 2022-10-02: 12 mg via ORAL
  Filled 2022-10-02: qty 1

## 2022-10-02 MED ORDER — 0.9 % SODIUM CHLORIDE (POUR BTL) OPTIME
TOPICAL | Status: DC | PRN
  Administered 2022-10-02: 1000 mL

## 2022-10-02 MED ORDER — SUGAMMADEX SODIUM 200 MG/2ML IV SOLN
INTRAVENOUS | Status: DC | PRN
  Administered 2022-10-02: 200 mg via INTRAVENOUS

## 2022-10-02 MED ORDER — DEXAMETHASONE SODIUM PHOSPHATE 10 MG/ML IJ SOLN
INTRAMUSCULAR | Status: DC | PRN
  Administered 2022-10-02: 10 mg via INTRAVENOUS

## 2022-10-02 MED ORDER — BUPIVACAINE HCL 0.25 % IJ SOLN
INTRAMUSCULAR | Status: DC | PRN
  Administered 2022-10-02: 30 mL

## 2022-10-02 MED ORDER — SODIUM CHLORIDE 0.9 % IV SOLN
2.0000 g | Freq: Two times a day (BID) | INTRAVENOUS | Status: AC
  Administered 2022-10-02 – 2022-10-03 (×2): 2 g via INTRAVENOUS
  Filled 2022-10-02 (×2): qty 2

## 2022-10-02 MED ORDER — CELECOXIB 200 MG PO CAPS
ORAL_CAPSULE | ORAL | Status: AC
  Administered 2022-10-02: 200 mg via ORAL
  Filled 2022-10-02: qty 1

## 2022-10-02 MED ORDER — METHOCARBAMOL 500 MG PO TABS: 500.0000 mg | ORAL_TABLET | Freq: Three times a day (TID) | ORAL | Status: DC | PRN

## 2022-10-02 MED ORDER — ACETAMINOPHEN 500 MG PO TABS
ORAL_TABLET | ORAL | Status: AC
  Administered 2022-10-02: 1000 mg via ORAL
  Filled 2022-10-02: qty 2

## 2022-10-02 MED ORDER — ONDANSETRON HCL 4 MG/2ML IJ SOLN
INTRAMUSCULAR | Status: DC | PRN
  Administered 2022-10-02: 4 mg via INTRAVENOUS

## 2022-10-02 MED ORDER — GABAPENTIN 300 MG PO CAPS
ORAL_CAPSULE | ORAL | Status: AC
  Administered 2022-10-02: 300 mg via ORAL
  Filled 2022-10-02: qty 1

## 2022-10-02 MED ORDER — ONDANSETRON HCL 4 MG/2ML IJ SOLN: 4.0000 mg | Freq: Four times a day (QID) | INTRAMUSCULAR | Status: DC | PRN

## 2022-10-02 MED ORDER — LIDOCAINE HCL (CARDIAC) PF 100 MG/5ML IV SOSY
PREFILLED_SYRINGE | INTRAVENOUS | Status: DC | PRN
  Administered 2022-10-02: 80 mg via INTRAVENOUS

## 2022-10-02 MED ORDER — FENTANYL CITRATE (PF) 100 MCG/2ML IJ SOLN
INTRAMUSCULAR | Status: AC
  Administered 2022-10-02: 50 ug via INTRAVENOUS
  Filled 2022-10-02: qty 2

## 2022-10-02 MED ORDER — DIPHENHYDRAMINE HCL 50 MG/ML IJ SOLN: 12.5000 mg | Freq: Four times a day (QID) | INTRAMUSCULAR | Status: DC | PRN

## 2022-10-02 MED ORDER — KETOROLAC TROMETHAMINE 15 MG/ML IJ SOLN
15.0000 mg | Freq: Four times a day (QID) | INTRAMUSCULAR | Status: DC
  Administered 2022-10-02 – 2022-10-04 (×8): 15 mg via INTRAVENOUS
  Filled 2022-10-02 (×8): qty 1

## 2022-10-02 MED ORDER — CEFAZOLIN SODIUM-DEXTROSE 2-4 GM/100ML-% IV SOLN
INTRAVENOUS | Status: AC
  Filled 2022-10-02: qty 100

## 2022-10-02 MED ORDER — MORPHINE SULFATE (PF) 2 MG/ML IV SOLN: 2.0000 mg | INTRAVENOUS | Status: DC | PRN

## 2022-10-02 MED ORDER — METHOCARBAMOL 1000 MG/10ML IJ SOLN
500.0000 mg | Freq: Three times a day (TID) | INTRAVENOUS | Status: DC | PRN
  Administered 2022-10-02: 500 mg via INTRAVENOUS
  Filled 2022-10-02: qty 500

## 2022-10-02 MED ORDER — ROCURONIUM BROMIDE 10 MG/ML (PF) SYRINGE
PREFILLED_SYRINGE | INTRAVENOUS | Status: AC
  Filled 2022-10-02: qty 10

## 2022-10-02 MED ORDER — BUPIVACAINE HCL (PF) 0.25 % IJ SOLN
INTRAMUSCULAR | Status: AC
  Filled 2022-10-02: qty 30

## 2022-10-02 MED ORDER — PROPOFOL 10 MG/ML IV BOLUS
INTRAVENOUS | Status: DC | PRN
  Administered 2022-10-02: 140 mg via INTRAVENOUS

## 2022-10-02 MED ORDER — OXYCODONE HCL 5 MG PO TABS: 5.0000 mg | ORAL_TABLET | ORAL | Status: DC | PRN

## 2022-10-02 MED ORDER — METOPROLOL SUCCINATE ER 50 MG PO TB24
50.0000 mg | ORAL_TABLET | Freq: Every day | ORAL | Status: DC
  Administered 2022-10-03 – 2022-10-04 (×2): 50 mg via ORAL
  Filled 2022-10-02 (×2): qty 1

## 2022-10-02 MED ORDER — KETOROLAC TROMETHAMINE 15 MG/ML IJ SOLN
INTRAMUSCULAR | Status: AC
  Administered 2022-10-02: 15 mg via INTRAVENOUS
  Filled 2022-10-02: qty 1

## 2022-10-02 MED ORDER — ROCURONIUM BROMIDE 100 MG/10ML IV SOLN
INTRAVENOUS | Status: DC | PRN
  Administered 2022-10-02: 10 mg via INTRAVENOUS
  Administered 2022-10-02: 50 mg via INTRAVENOUS

## 2022-10-02 MED ORDER — HYDRALAZINE HCL 20 MG/ML IJ SOLN: 10.0000 mg | INTRAMUSCULAR | Status: DC | PRN

## 2022-10-02 MED ORDER — LIDOCAINE HCL (PF) 2 % IJ SOLN
INTRAMUSCULAR | Status: AC
  Filled 2022-10-02: qty 5

## 2022-10-02 MED ORDER — OXYCODONE HCL 5 MG/5ML PO SOLN: 5.0000 mg | Freq: Once | ORAL | Status: AC | PRN

## 2022-10-02 MED ORDER — FAMOTIDINE 20 MG PO TABS
ORAL_TABLET | ORAL | Status: AC
  Administered 2022-10-02: 20 mg via ORAL
  Filled 2022-10-02: qty 1

## 2022-10-02 MED ORDER — HEPARIN SODIUM (PORCINE) 5000 UNIT/ML IJ SOLN
INTRAMUSCULAR | Status: AC
  Administered 2022-10-02: 5000 [IU] via SUBCUTANEOUS
  Filled 2022-10-02: qty 1

## 2022-10-02 MED ORDER — SODIUM CHLORIDE 0.9 % IV SOLN
2.0000 g | Freq: Two times a day (BID) | INTRAVENOUS | Status: DC
  Filled 2022-10-02 (×2): qty 2

## 2022-10-02 MED ORDER — SODIUM CHLORIDE 0.9 % IV SOLN: INTRAVENOUS | Status: DC

## 2022-10-02 MED ORDER — CHLORHEXIDINE GLUCONATE 0.12 % MT SOLN
OROMUCOSAL | Status: AC
  Administered 2022-10-02: 15 mL via OROMUCOSAL
  Filled 2022-10-02: qty 15

## 2022-10-02 MED ORDER — PANTOPRAZOLE SODIUM 40 MG IV SOLR
40.0000 mg | Freq: Every day | INTRAVENOUS | Status: DC
  Administered 2022-10-02 – 2022-10-03 (×2): 40 mg via INTRAVENOUS
  Filled 2022-10-02 (×2): qty 10

## 2022-10-02 MED ORDER — PROCHLORPERAZINE EDISYLATE 10 MG/2ML IJ SOLN: 5.0000 mg | Freq: Four times a day (QID) | INTRAMUSCULAR | Status: DC | PRN

## 2022-10-02 MED ORDER — LOSARTAN POTASSIUM 25 MG PO TABS
25.0000 mg | ORAL_TABLET | Freq: Every day | ORAL | Status: DC
  Administered 2022-10-03 – 2022-10-04 (×2): 25 mg via ORAL
  Filled 2022-10-02 (×2): qty 1

## 2022-10-02 MED ORDER — SODIUM CHLORIDE (PF) 0.9 % IJ SOLN
INTRAMUSCULAR | Status: DC | PRN
  Administered 2022-10-02: 50 mL

## 2022-10-02 MED ORDER — FENTANYL CITRATE (PF) 100 MCG/2ML IJ SOLN
25.0000 ug | INTRAMUSCULAR | Status: DC | PRN
  Administered 2022-10-02: 50 ug via INTRAVENOUS

## 2022-10-02 MED ORDER — PHENYLEPHRINE HCL-NACL 20-0.9 MG/250ML-% IV SOLN
INTRAVENOUS | Status: AC
  Filled 2022-10-02: qty 250

## 2022-10-02 MED ORDER — PROPOFOL 10 MG/ML IV BOLUS
INTRAVENOUS | Status: AC
  Filled 2022-10-02: qty 20

## 2022-10-02 MED ORDER — DIPHENHYDRAMINE HCL 12.5 MG/5ML PO ELIX: 12.5000 mg | ORAL_SOLUTION | Freq: Four times a day (QID) | ORAL | Status: DC | PRN

## 2022-10-02 MED ORDER — AMLODIPINE BESYLATE 5 MG PO TABS
5.0000 mg | ORAL_TABLET | Freq: Every day | ORAL | Status: DC
  Administered 2022-10-03 – 2022-10-04 (×2): 5 mg via ORAL
  Filled 2022-10-02 (×2): qty 1

## 2022-10-02 MED ORDER — PROPOFOL 500 MG/50ML IV EMUL
INTRAVENOUS | Status: DC | PRN
  Administered 2022-10-02: 30 ug/kg/min via INTRAVENOUS

## 2022-10-02 MED ORDER — PHENYLEPHRINE 80 MCG/ML (10ML) SYRINGE FOR IV PUSH (FOR BLOOD PRESSURE SUPPORT)
PREFILLED_SYRINGE | INTRAVENOUS | Status: AC
  Filled 2022-10-02: qty 10

## 2022-10-02 MED ORDER — FESOTERODINE FUMARATE ER 4 MG PO TB24
4.0000 mg | ORAL_TABLET | Freq: Every day | ORAL | Status: DC
  Administered 2022-10-03 – 2022-10-04 (×2): 4 mg via ORAL
  Filled 2022-10-02 (×2): qty 1

## 2022-10-02 MED ORDER — FENTANYL CITRATE (PF) 100 MCG/2ML IJ SOLN
INTRAMUSCULAR | Status: DC | PRN
  Administered 2022-10-02 (×2): 50 ug via INTRAVENOUS

## 2022-10-02 MED ORDER — BUPIVACAINE LIPOSOME 1.3 % IJ SUSP
INTRAMUSCULAR | Status: AC
  Filled 2022-10-02: qty 20

## 2022-10-02 MED ORDER — PROPOFOL 10 MG/ML IV BOLUS
INTRAVENOUS | Status: AC
  Filled 2022-10-02: qty 40

## 2022-10-02 MED ORDER — BUPIVACAINE LIPOSOME 1.3 % IJ SUSP
INTRAMUSCULAR | Status: DC | PRN
  Administered 2022-10-02: 20 mL

## 2022-10-02 MED ORDER — PROCHLORPERAZINE MALEATE 10 MG PO TABS: 10.0000 mg | ORAL_TABLET | Freq: Four times a day (QID) | ORAL | Status: DC | PRN

## 2022-10-02 MED ORDER — SODIUM CHLORIDE (PF) 0.9 % IJ SOLN
INTRAMUSCULAR | Status: AC
  Filled 2022-10-02: qty 50

## 2022-10-02 MED ORDER — OXYCODONE HCL 5 MG PO TABS
ORAL_TABLET | ORAL | Status: AC
  Filled 2022-10-02: qty 1

## 2022-10-02 MED ORDER — SODIUM CHLORIDE 0.9 % IV SOLN
INTRAVENOUS | Status: AC
  Filled 2022-10-02: qty 2

## 2022-10-02 MED ORDER — PHENYLEPHRINE 80 MCG/ML (10ML) SYRINGE FOR IV PUSH (FOR BLOOD PRESSURE SUPPORT)
PREFILLED_SYRINGE | INTRAVENOUS | Status: DC | PRN
  Administered 2022-10-02: 80 ug via INTRAVENOUS
  Administered 2022-10-02 (×2): 160 ug via INTRAVENOUS

## 2022-10-02 MED ORDER — ENOXAPARIN SODIUM 40 MG/0.4ML IJ SOSY
40.0000 mg | PREFILLED_SYRINGE | INTRAMUSCULAR | Status: DC
  Administered 2022-10-03 – 2022-10-04 (×2): 40 mg via SUBCUTANEOUS
  Filled 2022-10-02 (×2): qty 0.4

## 2022-10-02 MED ORDER — ONDANSETRON 4 MG PO TBDP: 4.0000 mg | ORAL_TABLET | Freq: Four times a day (QID) | ORAL | Status: DC | PRN

## 2022-10-02 SURGICAL SUPPLY — 42 items
BARRIER ADH SEPRAFILM 3INX5IN (MISCELLANEOUS) IMPLANT
CHLORAPREP W/TINT 26 (MISCELLANEOUS) ×1 IMPLANT
DRAPE LAPAROTOMY 100X77 ABD (DRAPES) ×1 IMPLANT
ELECT EZSTD 165MM 6.5IN (MISCELLANEOUS) ×1
ELECT REM PT RETURN 9FT ADLT (ELECTROSURGICAL) ×1
ELECTRODE EZSTD 165MM 6.5IN (MISCELLANEOUS) ×1 IMPLANT
ELECTRODE REM PT RTRN 9FT ADLT (ELECTROSURGICAL) ×1 IMPLANT
GAUZE 4X4 16PLY ~~LOC~~+RFID DBL (SPONGE) ×1 IMPLANT
GAUZE SPONGE 4X4 12PLY STRL (GAUZE/BANDAGES/DRESSINGS) ×1 IMPLANT
GLOVE BIO SURGEON STRL SZ7 (GLOVE) ×2 IMPLANT
GOWN STRL REUS W/ TWL LRG LVL3 (GOWN DISPOSABLE) ×4 IMPLANT
GOWN STRL REUS W/TWL LRG LVL3 (GOWN DISPOSABLE) ×4
KIT TURNOVER KIT A (KITS) ×1 IMPLANT
LABEL OR SOLS (LABEL) ×1 IMPLANT
LIGASURE IMPACT 36 18CM CVD LR (INSTRUMENTS) ×1 IMPLANT
MANIFOLD NEPTUNE II (INSTRUMENTS) ×1 IMPLANT
NEEDLE HYPO 22GX1.5 SAFETY (NEEDLE) ×1 IMPLANT
NS IRRIG 1000ML POUR BTL (IV SOLUTION) ×1 IMPLANT
PACK BASIN MAJOR ARMC (MISCELLANEOUS) ×1 IMPLANT
PACK COLON CLEAN CLOSURE (MISCELLANEOUS) ×1 IMPLANT
RELOAD PROXIMATE 75MM BLUE (ENDOMECHANICALS) ×3 IMPLANT
RELOAD STAPLE 75 3.8 BLU REG (ENDOMECHANICALS) IMPLANT
RETRACTOR WND ALEXIS-O 25 LRG (MISCELLANEOUS) IMPLANT
RTRCTR WOUND ALEXIS O 25CM LRG (MISCELLANEOUS) ×1
SPONGE T-LAP 18X18 ~~LOC~~+RFID (SPONGE) ×5 IMPLANT
STAPLER CVD CUT BL 40 RELOAD (ENDOMECHANICALS) IMPLANT
STAPLER CVD CUT BLU 40 RELOAD (ENDOMECHANICALS) ×1 IMPLANT
STAPLER PROXIMATE 75MM BLUE (STAPLE) IMPLANT
STAPLER SKIN PROX 35W (STAPLE) ×1 IMPLANT
SUT PDS AB 0 CT1 27 (SUTURE) ×3 IMPLANT
SUT SILK 2 0 (SUTURE) ×2
SUT SILK 2 0SH CR/8 30 (SUTURE) ×1 IMPLANT
SUT SILK 2-0 18XBRD TIE 12 (SUTURE) ×1 IMPLANT
SUT SILK 2-0 30XBRD TIE 12 (SUTURE) IMPLANT
SUT V-LOC 90 ABS 3-0 VLT  V-20 (SUTURE) ×1
SUT V-LOC 90 ABS 3-0 VLT V-20 (SUTURE) IMPLANT
SUT VIC AB 3-0 SH 27 (SUTURE) ×1
SUT VIC AB 3-0 SH 27X BRD (SUTURE) ×1 IMPLANT
SYR 20ML LL LF (SYRINGE) ×1 IMPLANT
TRAP FLUID SMOKE EVACUATOR (MISCELLANEOUS) ×1 IMPLANT
TRAY FOLEY MTR SLVR 16FR STAT (SET/KITS/TRAYS/PACK) ×1 IMPLANT
WATER STERILE IRR 500ML POUR (IV SOLUTION) ×1 IMPLANT

## 2022-10-02 NOTE — Anesthesia Preprocedure Evaluation (Signed)
Anesthesia Evaluation  Patient identified by MRN, date of birth, ID band Patient awake    Reviewed: Allergy & Precautions, NPO status , Patient's Chart, lab work & pertinent test results  History of Anesthesia Complications Negative for: history of anesthetic complications  Airway Mallampati: III  TM Distance: >3 FB Neck ROM: Full    Dental no notable dental hx. (+) Teeth Intact, Implants   Pulmonary neg pulmonary ROS, neg sleep apnea, neg COPD, Patient abstained from smoking.Not current smoker   Pulmonary exam normal breath sounds clear to auscultation       Cardiovascular Exercise Tolerance: Good METShypertension, Pt. on medications +CHF  (-) CAD and (-) Past MI (-) dysrhythmias  Rhythm:Regular Rate:Normal - Systolic murmurs 1. Left ventricular ejection fraction, by estimation, is 60 to 65%. The  left ventricle has normal function. The left ventricle has no regional  wall motion abnormalities. There is mild left ventricular hypertrophy.  Left ventricular diastolic parameters  are consistent with Grade I diastolic dysfunction (impaired relaxation).  2. Right ventricular systolic function is normal. The right ventricular  size is normal. There is normal pulmonary artery systolic pressure.  3. The mitral valve is normal in structure. Trivial mitral valve  regurgitation. No evidence of mitral stenosis.  4. The aortic valve is normal in structure. Aortic valve regurgitation is  not visualized. Mild to moderate aortic valve sclerosis/calcification is  present, without any evidence of aortic stenosis.  5. The inferior vena cava is normal in size with greater than 50%  respiratory variability, suggesting right atrial pressure of 3 mmHg.    Neuro/Psych negative neurological ROS  negative psych ROS   GI/Hepatic ,neg GERD  ,,(+)     (-) substance abuse    Endo/Other  neg diabetes    Renal/GU CRFRenal disease      Musculoskeletal  (+) Arthritis ,    Abdominal Normal abdominal exam  (+)   Peds  Hematology   Anesthesia Other Findings Past Medical History: No date: Arthritis No date: Chronic kidney disease, stage 3a ([place])     Comment:  cyst near kidney No date: Constipation No date: Heart murmur     Comment:  past No date: History of colonoscopy     Comment:  2019 in [place]; patient states no longer screening for colon              cancer. No date: Hypertension No date: IBS (irritable bowel syndrome) No date: Mitral valve insufficiency No date: Pneumonia No date: PONV (postoperative nausea and vomiting) No date: Pre-diabetes No date: Renal cyst No date: Right leg DVT ([facility])  Reproductive/Obstetrics                             Anesthesia Physical Anesthesia Plan  ASA: 3  Anesthesia Plan: General   Post-op Pain Management:    Induction: Intravenous  PONV Risk Score and Plan: 3 and TIVA and Propofol infusion  Airway Management Planned: Oral ETT  Additional Equipment: None  Intra-op Plan:   Post-operative Plan: Extubation in OR  Informed Consent: I have reviewed the patients History and Physical, chart, labs and discussed the procedure including the risks, benefits and alternatives for the proposed anesthesia with the patient or authorized representative who has indicated his/her understanding and acceptance.     Dental advisory given  Plan Discussed with: CRNA and Surgeon  Anesthesia Plan Comments: (Patient consented for risks of anesthesia including but not limited to:  - adverse  reactions to medications - damage to eyes, teeth, lips or other oral mucosa - nerve damage due to positioning  - sore throat or hoarseness - Damage to heart, brain, nerves, lungs, other parts of body or loss of life  Patient voiced understanding.)        Anesthesia Quick Evaluation

## 2022-10-02 NOTE — Op Note (Signed)
PROCEDURES: Small bowel resection 90 cms with side to side functional end to end stapled anastomosis Appendectomy  Pre-operative Diagnosis: small bowel multifocal neuroendocrine tumor  Post-operative Diagnosis: Same  Surgeon: [place]   Assistants: Hildred Alamin PA-S   Anesthesia: General endotracheal anesthesia  ASA Class: 2  Surgeon: Caroleen Hamman , MD FACS  Anesthesia: Gen. with endotracheal tube   Findings: Multifocal small bowel neuroendocrine tumor: we found 18 small to medium size intraluminal tumors within the ileum I was able to have 10cm proximal and distal margins. Anastomosis 20cms from the IC valve Mesenteric involvement with multiple implants ( foci) No evidence of colon involvement No evidence of distant metastasis Tension free anastomosis, no evidence of intraop leak and good perfusion  Estimated Blood Loss: 20cc         Drains: none         Specimens: Small bowel [id number]          Complications: none          Procedure Details  The patient was seen again in the Holding Room. The benefits, complications, treatment options, and expected outcomes were discussed with the patient. The risks of bleeding, infection, recurrence of symptoms, failure to resolve symptoms,  bowel injury, any of which could require further surgery were reviewed with the patient.   The patient was taken to Operating Room, identified as Donna Ferguson and the procedure verified.  A Time Out was held and the above information confirmed.  Prior to the induction of general anesthesia, antibiotic prophylaxis was administered. VTE prophylaxis was in place. General endotracheal anesthesia was then administered and tolerated well. After the induction, the abdomen was prepped with Chloraprep and draped in the sterile fashion. The patient was positioned in the supine position.  Midline incision was created  and we performed a laparotomy after elevating the fascia, no injuries observed. The abdominal cavity  was entered under direct visualization and wound protector was placed..   The greater omentum was  attached to the pelvis and I divided  using Ligasure device.   Exploration revealed several small to medium size tumors within the Ileum varying from 5 mm -15 mm in size. There was evidence of large mesenteric implant all corresponding to the area of bowel resection. No evidence of colonic involvement or distant metastasis.  A 10 cm margin on the  ileum both distally and proximally was identified and we created a window with electrocautery and divided the bowel both proximally and distally using 75 mm stapler.  Attention then was turned to the distal excision margin.  We identified the middle colic artery on selected a spot right to the middle colic artery.   The mesentery was scored with electrocautery.  We identified the ileal artery and suture ligated with 2 o silks in the standard fashion.  The rest of the mesentery was divided using the harmonic scalpel.  Please note that we went as low as possible to the base of the mesentery to obtain adequate lymph nodes and adequate margins of dissection. Specimen was passed and sent to permanent pathology.  A standard side-to-side functional end to end stapled anastomosis was created with multiple loads of a 75 GIA stapler device.  We check for patency as well as leak. Common channel closed w 2-0 vicryl and second lembert suture done using 3-0 v-lock. There was a tension-free anastomosis with good perfusion and no evidence of intraoperative leak.  The mesenteric defect was closed with a running 2-0 Vicryl in the  standard fashion.   I also performed an appendectomy as there was some thickening within the mesoappendix and I did not want to run the risk of leaving some additional neoplastic foci. THe mesentery of the appendix was divided using ligasure device and 75 GIA was used to divide the appendix, Liposomal Marcaine was injected throughout the abdominal wall on  both sides under direct visualization and palpation.  Seprafilm was placed to avoid adhesions.The fascia was closed with a running 0 PDS using the small bite techniques.  Incision was closed with staples. Needle and laparotomy counts were correct and there were no immediate complications     Caroleen Hamman, MD, FACS

## 2022-10-02 NOTE — Interval H&P Note (Signed)
History and Physical Interval Note:  10/02/2022 7:34 AM  Donna Ferguson  has presented today for surgery, with the diagnosis of small bowel cancer.  The various methods of treatment have been discussed with the patient and family. After consideration of risks, benefits and other options for treatment, the patient has consented to  Procedure(s): SMALL BOWEL RESECTION, open, possible colectomy, RNFA to assist (N/A) PARTIAL COLECTOMY (N/A) as a surgical intervention.  The patient's history has been reviewed, patient examined, no change in status, stable for surgery.  I have reviewed the patient's chart and labs.  Questions were answered to the patient's satisfaction.     [place]

## 2022-10-02 NOTE — Progress Notes (Signed)
Patient awake/alert x4.  Tolerated clear liquid lunch tray without event. Medicated for discomfort as ordered. Dr. Dahlia Byes spoke to patient in pacu, reviewed procedure and post-op course. Patient verbalizes understanding. Family updated.

## 2022-10-02 NOTE — Anesthesia Postprocedure Evaluation (Signed)
Anesthesia Post Note  Patient: Donna Ferguson  Procedure(s) Performed: SMALL BOWEL RESECTION, open, possible colectomy, RNFA to assist  Patient location during evaluation: PACU Anesthesia Type: General Level of consciousness: awake and alert Pain management: pain level controlled Vital Signs Assessment: post-procedure vital signs reviewed and stable Respiratory status: spontaneous breathing, nonlabored ventilation, respiratory function stable and patient connected to nasal cannula oxygen Cardiovascular status: blood pressure returned to baseline and stable Postop Assessment: no apparent nausea or vomiting Anesthetic complications: no  No notable events documented.   Last Vitals:  Vitals:   10/02/22 1030 10/02/22 1045  BP: 134/81 (!) 142/81  Pulse: 73 71  Resp: 17 17  Temp:  (!) 36.1 C  SpO2: 100% 100%    Last Pain:  Vitals:   10/02/22 1045  TempSrc:   PainSc: [place]

## 2022-10-02 NOTE — Anesthesia Procedure Notes (Signed)
Procedure Name: Intubation Date/Time: 10/02/2022 7:50 AM  Performed by: Chanetta Marshall, CRNAPre-anesthesia Checklist: Patient identified, Emergency Drugs available, Suction available and Patient being monitored Patient Re-evaluated:Patient Re-evaluated prior to induction Oxygen Delivery Method: Circle system utilized Preoxygenation: Pre-oxygenation with 100% oxygen Induction Type: IV induction Ventilation: Mask ventilation without difficulty Laryngoscope Size: McGraph and 3 Grade View: Grade I Tube type: Oral Tube size: 7.0 mm Number of attempts: 1 Airway Equipment and Method: Video-laryngoscopy Placement Confirmation: ETT inserted through vocal cords under direct vision, positive ETCO2, breath sounds checked- equal and bilateral and CO2 detector Secured at: 22 cm Tube secured with: Tape Dental Injury: Teeth and Oropharynx as per pre-operative assessment

## 2022-10-02 NOTE — Transfer of Care (Signed)
Immediate Anesthesia Transfer of Care Note  Patient: Donna Ferguson  Procedure(s) Performed: SMALL BOWEL RESECTION, open, possible colectomy, RNFA to assist  Patient Location: PACU  Anesthesia Type:General  Level of Consciousness: awake  Airway & Oxygen Therapy: Patient Spontanous Breathing  Post-op Assessment: Report given to RN and Post -op Vital signs reviewed and stable  Post vital signs: Reviewed and stable  Last Vitals:  Vitals Value Taken Time  BP 127/74 10/02/22 1022  Temp    Pulse 73 10/02/22 1023  Resp 18 10/02/22 1023  SpO2 92 % 10/02/22 1023  Vitals shown include unvalidated device data.  Last Pain:  Vitals:   10/02/22 0620  TempSrc: Oral         Complications: No notable events documented.

## 2022-10-03 ENCOUNTER — Encounter: Payer: Self-pay | Admitting: Surgery

## 2022-10-03 LAB — BASIC METABOLIC PANEL
Anion gap: 9 (ref 5–15)
BUN: 17 mg/dL (ref 8–23)
CO2: 19 mmol/L — ABNORMAL LOW (ref 22–32)
Calcium: 8.6 mg/dL — ABNORMAL LOW (ref 8.9–10.3)
Chloride: 107 mmol/L (ref 98–111)
Creatinine, Ser: 1.12 mg/dL — ABNORMAL HIGH (ref 0.44–1.00)
GFR, Estimated: 50 mL/min — ABNORMAL LOW (ref >60)
Glucose, Bld: 118 mg/dL — ABNORMAL HIGH (ref 70–99)
Potassium: 4.1 mmol/L (ref 3.5–5.1)
Sodium: 135 mmol/L (ref 135–145)

## 2022-10-03 LAB — CBC
HCT: 38.9 % (ref 36.0–46.0)
Hemoglobin: 12.8 g/dL (ref 12.0–15.0)
MCH: 28.2 pg (ref 26.0–34.0)
MCHC: 32.9 g/dL (ref 30.0–36.0)
MCV: 85.7 fL (ref 80.0–100.0)
Platelets: 182 10*3/uL (ref 150–400)
RBC: 4.54 MIL/uL (ref 3.87–5.11)
RDW: 13.8 % (ref 11.5–15.5)
WBC: 14.6 10*3/uL — ABNORMAL HIGH (ref 4.0–10.5)
nRBC: 0 % (ref 0.0–0.2)

## 2022-10-03 LAB — MAGNESIUM: Magnesium: 1.7 mg/dL (ref 1.7–2.4)

## 2022-10-03 NOTE — Progress Notes (Signed)
[place] Hospital Day(s): 1.   Post op day(s): 1 Day Post-Op.   Interval History:  Patient seen and examined No acute events or new complaints overnight.  Patient reports she is feeling better  Abdominal soreness improved No fever, chills, nausea, emesis Leukocytosis improving; down to 14.6K Renal function at baseline; sCr - 1.12; UO - 2250 ccs No electrolyte derangements  She is on CLD: tolerating No flatus  Vital signs in last 24 hours: [min-max] current  Temp:  [96.8 F (36 C)-98.2 F (36.8 C)] 98.1 F (36.7 C) (02/14 0403) Pulse Rate:  [71-86] 78 (02/14 0403) Resp:  [15-23] 16 (02/14 0403) BP: (121-149)/(70-93) 122/71 (02/14 0403) SpO2:  [95 %-100 %] 100 % (02/14 0403)     Height: 5' 3"$  (160 cm) Weight: 68.9 kg BMI (Calculated): 26.93   Intake/Output last 2 shifts:  02/13 0701 - 02/14 0700 In: 2540.7 [P.O.:225; I.V.:2065.7; IV Piggyback:250] Out: 2275 [Urine:2250; Blood:25]   Physical Exam:  Constitutional: alert, cooperative and no distress  Respiratory: breathing non-labored at rest  Cardiovascular: regular rate and sinus rhythm  Gastrointestinal: soft, non-tender, and non-distended, no rebound/guarding Integumentary: Laparotomy is CDI with staples and honeycomb, no drainage, no erythema   Labs:     Latest Ref Rng & Units 10/03/2022    6:05 AM 10/02/2022    3:32 PM 05/03/2022    9:46 AM  CBC  WBC 4.0 - 10.5 K/uL 14.6  15.4  8.5   Hemoglobin 12.0 - 15.0 g/dL 12.8  14.7  13.2   Hematocrit 36.0 - 46.0 % 38.9  45.4  40.7   Platelets 150 - 400 K/uL 182  266  331       Latest Ref Rng & Units 10/03/2022    6:05 AM 10/02/2022    2:39 PM 05/03/2022    9:46 AM  CMP  Glucose 70 - 99 mg/dL 118   113   BUN 8 - 23 mg/dL 17   19   Creatinine 0.44 - 1.00 mg/dL 1.12  1.14  0.93   Sodium 135 - 145 mmol/L 135   138   Potassium 3.5 - 5.1 mmol/L 4.1   4.0   Chloride 98 - 111 mmol/L 107   106   CO2 22 - 32 mmol/L 19   25    Calcium 8.9 - 10.3 mg/dL 8.6   9.6   Total Protein 6.5 - 8.1 g/dL   7.9   Total Bilirubin 0.3 - 1.2 mg/dL   0.6   Alkaline Phos 38 - 126 U/L   94   AST 15 - 41 U/L   20   ALT 0 - 44 U/L   11     Imaging studies: No new pertinent imaging studies   Assessment/Plan: 82 y.o. 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   - Okay for FLD; await ROBF before advancing further    - Complete perioperative Abx - Discontinue foley catheter - Monitor abdominal examination; on-going bowel function   - Pain control prn; antiemetics prn   - Monitor leukocytosis; likely reactive - Mobilize; OOB  - Discharge Planning; Awaiting ROBF and diet advancement; otherwise doing well. Potentially home in 24-48 hours pending clinical condition.   All of the above findings and recommendations were discussed with the patient, and the medical team, and all of patient's questions were answered to her expressed satisfaction.  -- Edison Simon, PA-C [facility] 10/03/2022, 7:41 AM M-F: 7am - 4pm

## 2022-10-03 NOTE — TOC Initial Note (Signed)
Transition of Care [facility]) - Initial/Assessment Note    Patient Details  Name: Donna Ferguson MRN: [id number] Date of Birth: 1941-07-19  Transition of Care [facility]) CM/SW Contact:    Beverly Sessions, RN Phone Number: 10/03/2022, 11:32 AM  Clinical Narrative:                  Transition of Care (TOC) Screening Note   Patient Details  Name: Donna Ferguson Date of Birth: [birth year]   Transition of Care [facility]) CM/SW Contact:    Beverly Sessions, RN Phone Number: 10/03/2022, 11:32 AM    Transition of Care [facility]) has reviewed patient and no TOC needs have been identified at this time. We will continue to monitor patient advancement through interdisciplinary progression rounds. If new patient transition needs arise, please place a TOC consult.          Patient Goals and CMS Choice            Expected Discharge Plan and Services                                              Prior Living Arrangements/Services                       Activities of Daily Living Home Assistive Devices/Equipment: None ADL Screening (condition at time of admission) Patient's cognitive ability adequate to safely complete daily activities?: Yes Is the patient deaf or have difficulty hearing?: No Does the patient have difficulty seeing, even when wearing glasses/contacts?: No Does the patient have difficulty concentrating, remembering, or making decisions?: No Patient able to express need for assistance with ADLs?: No Does the patient have difficulty dressing or bathing?: No Independently performs ADLs?: Yes (appropriate for developmental age) Does the patient have difficulty walking or climbing stairs?: No Weakness of Legs: None Weakness of Arms/Hands: None  Permission Sought/Granted                  Emotional Assessment              Admission diagnosis:  Carcinoid tumor of small intestine [D3A.019] Patient Active Problem List   Diagnosis Date  Noted   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[place]) 10/02/2022   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   Mural thickening of colon    Polyp of ascending colon    Polyp of descending colon    Abnormal CT scan, colon 05/01/2022   Epigastric pain 04/02/2022   Status post total knee replacement using cement, 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   Lymphadenopathy 12/05/2021   Chronic kidney disease, stage 3a ([place]) 11/29/2021   Irritable 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 diarrhea 11/29/2021   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   LLQ pain 11/17/2021   Urinary frequency 12/07/2020   Dysuria 10/10/2020   Peripheral neuropathy 08/05/2020   Hand pain 08/05/2020   Skin lesion 01/13/2020   Constipation 01/01/2020   Renal cyst 01/01/2020   Liver cyst 01/01/2020   Low back pain 01/01/2020   Abdominal pain 12/08/2019   HTN (hypertension) 10/28/2019   Prediabetes 10/28/2019   Right knee pain 10/28/2019   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   Nuclear sclerotic cataract of both eyes 10/15/2019   Bilateral knee pain 05/29/2019   PCP:  Burnard Hawthorne, FNP Pharmacy:   [facility] [place], [address] [place] [address] Phone: [phone] Fax: [phone]     Social Determinants of Health (SDOH) Social History: SDOH Screenings   Food Insecurity: No Food Insecurity (10/02/2022)  Housing: Low Risk  (10/02/2022)  Transportation Needs: No Transportation Needs (10/02/2022)  Utilities: Not At Risk (10/02/2022)  Depression (PHQ2-9): Low Risk  (08/06/2022)  Financial Resource Strain: Low Risk  (08/06/2022)  Social Connections: Unknown (08/06/2022)  Stress: No Stress Concern Present (08/06/2022)  Tobacco Use: Low Risk  (10/03/2022)   SDOH Interventions:     Readmission Risk Interventions     No data to display

## 2022-10-04 LAB — CBC
HCT: 35.3 % — ABNORMAL LOW (ref 36.0–46.0)
Hemoglobin: 11.8 g/dL — ABNORMAL LOW (ref 12.0–15.0)
MCH: 29 pg (ref 26.0–34.0)
MCHC: 33.4 g/dL (ref 30.0–36.0)
MCV: 86.7 fL (ref 80.0–100.0)
Platelets: 222 10*3/uL (ref 150–400)
RBC: 4.07 MIL/uL (ref 3.87–5.11)
RDW: 14 % (ref 11.5–15.5)
WBC: 12.4 10*3/uL — ABNORMAL HIGH (ref 4.0–10.5)
nRBC: 0 % (ref 0.0–0.2)

## 2022-10-04 MED ORDER — OXYCODONE HCL 5 MG PO TABS: 5.0000 mg | ORAL_TABLET | Freq: Four times a day (QID) | ORAL | 0 refills | Status: DC | PRN

## 2022-10-04 MED ORDER — IBUPROFEN 600 MG PO TABS: 600.0000 mg | ORAL_TABLET | Freq: Four times a day (QID) | ORAL | 0 refills | Status: DC | PRN

## 2022-10-04 MED ORDER — ONDANSETRON 4 MG PO TBDP: 4.0000 mg | ORAL_TABLET | Freq: Four times a day (QID) | ORAL | 0 refills | Status: DC | PRN

## 2022-10-04 NOTE — Progress Notes (Addendum)
[place] Hospital Day(s): 2.   Post op day(s): 2 Days Post-Op.   Interval History:  Patient seen and examined No acute events or new complaints overnight.  Patient reports she is doing well; abdominal soreness with movement She is frustrated by lack of flatus but no distension, burping, hiccupping  No fever, chills, nausea, emesis Leukocytosis improving; down to 12.4K; likely reactive from OR She is on FLD: tolerating No flatus  Vital signs in last 24 hours: [min-max] current  Temp:  [98 F (36.7 C)-98.7 F (37.1 C)] 98.2 F (36.8 C) (02/15 0602) Pulse Rate:  [72-84] 72 (02/15 0602) Resp:  [16-18] 17 (02/15 0602) BP: (130-141)/(68-77) 139/75 (02/15 0602) SpO2:  [95 %-100 %] 100 % (02/15 0602)     Height: 5' 3"$  (160 cm) Weight: 68.9 kg BMI (Calculated): 26.93   Intake/Output last 2 shifts:  02/14 0701 - 02/15 0700 In: 240 [P.O.:240] Out: -    Physical Exam:  Constitutional: alert, cooperative and no distress  Respiratory: breathing non-labored at rest  Cardiovascular: regular rate and sinus rhythm  Gastrointestinal: soft, non-tender, and non-distended, no rebound/guarding Integumentary: Laparotomy is CDI with staples and honeycomb, scant amount of drainage superiorly on honeycomb, no erythema   Labs:     Latest Ref Rng & Units 10/04/2022    5:28 AM 10/03/2022    6:05 AM 10/02/2022    3:32 PM  CBC  WBC 4.0 - 10.5 K/uL 12.4  14.6  15.4   Hemoglobin 12.0 - 15.0 g/dL 11.8  12.8  14.7   Hematocrit 36.0 - 46.0 % 35.3  38.9  45.4   Platelets 150 - 400 K/uL 222  182  266       Latest Ref Rng & Units 10/03/2022    6:05 AM 10/02/2022    2:39 PM 05/03/2022    9:46 AM  CMP  Glucose 70 - 99 mg/dL 118   113   BUN 8 - 23 mg/dL 17   19   Creatinine 0.44 - 1.00 mg/dL 1.12  1.14  0.93   Sodium 135 - 145 mmol/L 135   138   Potassium 3.5 - 5.1 mmol/L 4.1   4.0   Chloride 98 - 111 mmol/L 107   106   CO2 22 - 32 mmol/L 19   25   Calcium  8.9 - 10.3 mg/dL 8.6   9.6   Total Protein 6.5 - 8.1 g/dL   7.9   Total Bilirubin 0.3 - 1.2 mg/dL   0.6   Alkaline Phos 38 - 126 U/L   94   AST 15 - 41 U/L   20   ALT 0 - 44 U/L   11     Imaging studies: No new pertinent imaging studies   Assessment/Plan: 82 y.o. 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   - Will continue FLD; hesitant to advance until better bowel function; no gross evidence of ileus but will be cautious   - Will stop IVF - Monitor abdominal examination; on-going bowel function   - Pain control prn; antiemetics prn   - Monitor leukocytosis; improving - Mobilize; OOB; ambulating well   - Discharge Planning; Awaiting ROBF; no rush on DC. Once bowel function returns, will advance diet. Hopefully 24-48 hours.   All of the above findings and recommendations were discussed with the patient, and the medical team, and all of patient's questions were  answered to her expressed satisfaction.  -- Edison Simon, PA-C [facility] 10/04/2022, 7:08 AM M-F: 7am - 4pm

## 2022-10-04 NOTE — Discharge Instructions (Signed)
In addition to included general post-operative instructions,  Diet: Resume home diet.   Activity: No heavy lifting >20 pounds (children, pets, laundry, garbage) or strenuous activity for 6 weeks, but light activity and walking are encouraged. Do not drive or drink alcohol if taking narcotic pain medications or having pain that might distract from driving.  Wound care: You may shower/get incision wet with soapy water and pat dry (do not rub incisions), but no baths or submerging incision underwater until follow-up. It is okay for incision to be open to the air. We will remove staples at your first follow up appointment.   Medications: Resume all home medications. For mild to moderate pain: acetaminophen (Tylenol) or ibuprofen/naproxen (if no kidney disease). Combining Tylenol with alcohol can substantially increase your risk of causing liver disease. Narcotic pain medications, if prescribed, can be used for severe pain, though may cause nausea, constipation, and drowsiness. Do not combine Tylenol and Percocet (or similar) within a 6 hour period as Percocet (and similar) contain(s) Tylenol. If you do not need the narcotic pain medication, you do not need to fill the prescription.  Call office [phone] / [phone]) at any time if any questions, worsening pain, fevers/chills, bleeding, drainage from incision site, or other concerns.

## 2022-10-04 NOTE — Discharge Summary (Signed)
[facility] SURGICAL DISCHARGE SUMMARY  Patient ID: Donna Ferguson MRN: [id number] DOB/AGE: 05-08-1941 82 y.o.  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 female with history of multifocal small bowel neuroendocrine tumor who [facility] on [facility] for scheduled resection with Dr Dahlia Byes.   Hospital Course: Informed consent was obtained and documented, and patient underwent uneventful small bowel resection with appendectomy (Dr Dahlia Byes, 10/02/2022).  Post-operatively, patient did well. She had ROBF on POD2. Advancement of patient's diet and ambulation were well-tolerated. The remainder of patient's hospital course was essentially unremarkable, and discharge planning was initiated accordingly with patient safely able to be discharged home with appropriate discharge instructions, pain control, and outpatient follow-up after all of her questions were answered to her expressed satisfaction   Discharge Condition: Good    Allergies as of 10/04/2022   No Known Allergies      Medication List     STOP taking these medications    bisacodyl 5 MG EC tablet Commonly known as: DULCOLAX   metroNIDAZOLE 500 MG tablet Commonly known as: Flagyl   neomycin 500 MG tablet Commonly known as: MYCIFRADIN   polyethylene glycol powder 17 GM/SCOOP powder Commonly known as: MiraLax       TAKE these medications    amLODipine 5 MG tablet Commonly known as: NORVASC TAKE 1 TABLET(5 MG) BY MOUTH EVERY EVENING   ascorbic acid 500 MG tablet Commonly known as: VITAMIN C Take 500 mg by mouth daily.   cholecalciferol 25 MCG (1000 UNIT) tablet Commonly known as: VITAMIN D3 Take  1,000 Units by mouth daily.   ibuprofen 600 MG tablet Commonly known as: ADVIL Take 1 tablet (600 mg total) by mouth every 6 (six) hours as needed.   losartan 25 MG tablet Commonly known as: COZAAR TAKE 1 TABLET(25 MG) BY MOUTH DAILY   metoprolol succinate 50 MG 24 hr tablet Commonly known as: TOPROL-XL Take 50 mg by mouth daily. Take with or immediately following a meal.   ondansetron 4 MG disintegrating tablet Commonly known as: ZOFRAN-ODT Take 1 tablet (4 mg total) by mouth every 6 (six) hours as needed for nausea.   oxyCODONE 5 MG immediate release tablet Commonly known as: Oxy IR/ROXICODONE Take 1 tablet (5 mg total) by mouth every 6 (six) hours as needed for severe pain or breakthrough pain.   Trospium Chloride 60 MG Cp24 TAKE 1 CAPSULE(60 MG) BY MOUTH EVERY MORNING          Follow-up Information     P[place]on, [place] F, MD Follow up in 10 day(s).   Specialty: General Surgery Why: s/p small bowel resection, has staples Contact information: [address] [place] [address] [phone]                  Time spent on discharge management including discussion of hospital course, clinical condition, outpatient instructions, prescriptions, and follow up with the patient and members of the medical team: >30 minutes  -- Edison Simon , PA-C [facility]  10/04/2022, 1:27 PM [phone] M-F: 7am - 4pm

## 2022-10-05 ENCOUNTER — Telehealth: Payer: Self-pay | Admitting: *Deleted

## 2022-10-05 ENCOUNTER — Other Ambulatory Visit: Payer: Self-pay | Admitting: Anatomic Pathology & Clinical Pathology

## 2022-10-05 LAB — SURGICAL PATHOLOGY

## 2022-10-05 NOTE — Transitions of Care (Post Inpatient/ED Visit) (Signed)
   10/05/2022  Name: Donna Ferguson MRN: [id number] DOB: 1941-07-01  Today's TOC FU Call Status: Today's TOC FU Call Status:: Successful TOC FU Call Competed TOC FU Call Complete Date: 10/05/22  Transition Care Management Follow-up Telephone Call Date of Discharge: 10/04/22 Discharge Facility: [facility] Type of Discharge: Inpatient Admission Primary Inpatient Discharge Diagnosis:: Neuroendocrine carcinoma odf the small bowel How have you been since you were released from the hospital?: Better (Doing good this morning. I had breakfast/ I am not in much pain) Any questions or concerns?: No  Items Reviewed: Did you receive and understand the discharge instructions provided?: Yes Medications obtained and verified?: Yes (Medications Reviewed) Any new allergies since your discharge?: No Dietary orders reviewed?: Yes Type of Diet Ordered:: low sodium heart healthy Do you have support at home?: Yes People in Home: spouse Name of Support/Comfort Primary Source: [place] and Equipment/Supplies: [place] Ordered?: No Any new equipment or medical supplies ordered?: No  Functional Questionnaire: Do you need assistance with bathing/showering or dressing?: No Do you need assistance with meal preparation?: No Do you need assistance with eating?: No Do you have difficulty maintaining continence: No Do you need assistance with getting out of bed/getting out of a chair/moving?: No Do you have difficulty managing or taking your medications?: No  Folllow up appointments reviewed: PCP Follow-up appointment confirmed?: NA Specialist Hospital Follow-up appointment confirmed?: Yes Date of Specialist follow-up appointment?: 10/16/22 Follow-Up Specialty Provider:: [id number] Dr Tasia Catchings oncology                           [id number] 11:15 Dr Dahlia Byes surgeon Do you need transportation to your follow-up appointment?: No Do you understand care options if your condition(s) worsen?: Yes-patient  verbalized understanding     SDOH Interventions Today    Flowsheet Row Most Recent Value  SDOH Interventions   Food Insecurity Interventions Intervention Not Indicated  Housing Interventions Intervention Not Indicated  Transportation Interventions Intervention Not Indicated            [facility] [phone]

## 2022-10-08 ENCOUNTER — Telehealth: Payer: Self-pay

## 2022-10-08 ENCOUNTER — Telehealth: Payer: Self-pay | Admitting: Surgery

## 2022-10-08 ENCOUNTER — Other Ambulatory Visit: Payer: Self-pay | Admitting: Family

## 2022-10-08 DIAGNOSIS — I10 Essential (primary) hypertension: Secondary | ICD-10-CM

## 2022-10-08 NOTE — Telephone Encounter (Signed)
Spoke with patient regarding rash- she has not tried benadryl oral or cream. She will try this and if there in no improvement she will let us know. Also instructed to try dandruff shampoo for her scalp.

## 2022-10-08 NOTE — Telephone Encounter (Signed)
Patient had small bowel resection done on 10/02/22 wit Dr. Dahlia Byes.  Patient states that she has been having a lot of itching all over her scalp and under her breast now with some small red bumps.  No oozing.  Just itches really bad.  Not sure if something during surgery could have caused this.  She is wanting to know what she can do for the itching.  Please call her. Thank you.

## 2022-10-11 ENCOUNTER — Other Ambulatory Visit: Payer: Medicare Other

## 2022-10-12 ENCOUNTER — Ambulatory Visit (INDEPENDENT_AMBULATORY_CARE_PROVIDER_SITE_OTHER): Payer: Medicare Other | Admitting: Family

## 2022-10-12 ENCOUNTER — Encounter: Payer: Self-pay | Admitting: Family

## 2022-10-12 ENCOUNTER — Other Ambulatory Visit: Payer: Self-pay

## 2022-10-12 VITALS — BP 138/78 | HR 78 | Temp 98.3°F | Ht 63.0 in | Wt 150.8 lb

## 2022-10-12 DIAGNOSIS — C7A8 Other malignant neuroendocrine tumors: Secondary | ICD-10-CM

## 2022-10-12 DIAGNOSIS — I10 Essential (primary) hypertension: Secondary | ICD-10-CM | POA: Diagnosis not present

## 2022-10-12 DIAGNOSIS — R350 Frequency of micturition: Secondary | ICD-10-CM | POA: Diagnosis not present

## 2022-10-12 MED ORDER — LOSARTAN POTASSIUM 25 MG PO TABS: ORAL_TABLET | ORAL | 3 refills | Status: DC

## 2022-10-12 MED ORDER — METOPROLOL SUCCINATE ER 50 MG PO TB24: 50.0000 mg | ORAL_TABLET | Freq: Every day | ORAL | 3 refills | Status: DC

## 2022-10-12 MED ORDER — AMLODIPINE BESYLATE 5 MG PO TABS: ORAL_TABLET | ORAL | 3 refills | Status: DC

## 2022-10-12 NOTE — Assessment & Plan Note (Signed)
Slightly elevated today.  Patient has blood pressure machine at home and advised her to send me readings from home and if persistently elevated  > 130, I would like to adjust medications.  She verbalized understanding.  Continue Norvasc 5 mg qd, losartan 25 mg qd,  Toprol 50 mg qd.

## 2022-10-12 NOTE — Progress Notes (Signed)
Assessment & Plan:  Primary hypertension Assessment & Plan: Slightly elevated today.  Patient has blood pressure machine at home and advised her to send me readings from home and if persistently elevated  > 130, I would like to adjust medications.  She verbalized understanding.  Continue Norvasc 5 mg qd, losartan 25 mg qd,  Toprol 50 mg qd.   Orders: -     Metoprolol Succinate ER; Take 1 tablet (50 mg total) by mouth daily. Take with or immediately following a meal.  Dispense: 90 tablet; Refill: 3  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[place]) Assessment & Plan: Doing well status post bowel resection.  No concerns for infection regarding incision site.  Consulted with Dr. Dahlia Byes over secure chat whom advised follow-up in his office for suture removal in 2 weeks   Urinary frequency Assessment & Plan: Chronic, stable on trospium 60 mg daily.  Will continue.       Return precautions given.   Risks, benefits, and alternatives of the medications and treatment plan prescribed today were discussed, and patient expressed understanding.   Education regarding symptom management and diagnosis given to patient on AVS either electronically or printed.  No follow-ups on file.  Donna Paris, FNP  Subjective:    Patient ID: Donna Ferguson, female    DOB: 1941-01-04, 82 y.o.   MRN: [id number]  CC: Donna Ferguson is a 82 y.o. female who presents today for follow up.   HPI: 10 days after small bowel resection. She feels well today without complaints.  Constipation has resolved.  No abdominal pain.  She is not taking any further opioids since surgery. She is blood pressure machine at home.    She has suture removal with Dr Dahlia Byes 10/22/22. Discharge note Otho Ket PA 09/24/12 Diagnosis of neuroendocrine carcinoma of the small bowel without evidence of distant metastatic disease.  She is following with Dr. Dahlia Byes, recent small bowel resection 10/02/22 Recent consult with Dr. Vicente Males,  gastroenterology 08/22/2022 for GERD, constipation.  Counseled on use of MiraLAX  Allergies: Patient has no known allergies. Current Outpatient Medications on File Prior to Visit  Medication Sig Dispense Refill   cholecalciferol (VITAMIN D3) 25 MCG (1000 UNIT) tablet Take 1,000 Units by mouth daily.     ibuprofen (ADVIL) 600 MG tablet Take 1 tablet (600 mg total) by mouth every 6 (six) hours as needed. 30 tablet 0   Trospium Chloride 60 MG CP24 TAKE 1 CAPSULE(60 MG) BY MOUTH EVERY MORNING 90 capsule 1   vitamin C (ASCORBIC ACID) 500 MG tablet Take 500 mg by mouth daily.     No current facility-administered medications on file prior to visit.    Review of Systems  Constitutional:  Negative for chills and fever.  Respiratory:  Negative for cough.   Cardiovascular:  Negative for chest pain and palpitations.  Gastrointestinal:  Negative for nausea and vomiting.  Skin:  Positive for wound.      Objective:    BP 138/78   Pulse 78   Temp 98.3 F (36.8 C) (Oral)   Ht '5\' 3"'$  (1.6 m)   Wt 150 lb 12.8 oz (68.4 kg)   SpO2 97%   BMI 26.71 kg/m  BP Readings from Last 3 Encounters:  10/12/22 138/78  10/04/22 (!) 145/60  09/10/22 (!) 160/84   Wt Readings from Last 3 Encounters:  10/12/22 150 lb 12.8 oz (68.4 kg)  10/02/22 151 lb 15.8 oz (68.9 kg)  09/24/22 152 lb (68.9 kg)    Physical Exam  Vitals reviewed.  Constitutional:      Appearance: She is well-developed.  Eyes:     Conjunctiva/sclera: Conjunctivae normal.  Cardiovascular:     Rate and Rhythm: Normal rate and regular rhythm.     Pulses: Normal pulses.     Heart sounds: Normal heart sounds.  Pulmonary:     Effort: Pulmonary effort is normal.     Breath sounds: Normal breath sounds. No wheezing, rhonchi or rales.  Abdominal:       Comments: Well-approximated vertical incision.  Staples are intact.  No purulent discharge or increased warmth.  Skin:    General: Skin is warm and dry.  Neurological:     Mental Status: She  is alert.  Psychiatric:        Speech: Speech normal.        Behavior: Behavior normal.        Thought Content: Thought content normal.

## 2022-10-15 NOTE — Assessment & Plan Note (Signed)
Chronic, stable on trospium 60 mg daily.  Will continue.

## 2022-10-15 NOTE — Assessment & Plan Note (Signed)
Doing well status post bowel resection.  No concerns for infection regarding incision site.  Consulted with Dr. Dahlia Byes over secure chat whom advised follow-up in his office for suture removal in 2 weeks

## 2022-10-16 ENCOUNTER — Encounter: Payer: Self-pay | Admitting: Oncology

## 2022-10-16 ENCOUNTER — Ambulatory Visit (INDEPENDENT_AMBULATORY_CARE_PROVIDER_SITE_OTHER): Payer: Medicare Other

## 2022-10-16 ENCOUNTER — Inpatient Hospital Stay: Payer: Medicare Other | Attending: Oncology | Admitting: Oncology

## 2022-10-16 VITALS — BP 136/73 | HR 83 | Temp 96.7°F | Resp 18 | Wt 150.1 lb

## 2022-10-16 DIAGNOSIS — C7A8 Other malignant neuroendocrine tumors: Secondary | ICD-10-CM | POA: Diagnosis not present

## 2022-10-16 DIAGNOSIS — Z7189 Other specified counseling: Secondary | ICD-10-CM | POA: Diagnosis not present

## 2022-10-16 DIAGNOSIS — R591 Generalized enlarged lymph nodes: Secondary | ICD-10-CM | POA: Diagnosis not present

## 2022-10-16 DIAGNOSIS — I129 Hypertensive chronic kidney disease with stage 1 through stage 4 chronic kidney disease, or unspecified chronic kidney disease: Secondary | ICD-10-CM | POA: Diagnosis not present

## 2022-10-16 DIAGNOSIS — Z08 Encounter for follow-up examination after completed treatment for malignant neoplasm: Secondary | ICD-10-CM

## 2022-10-16 DIAGNOSIS — N1831 Chronic kidney disease, stage 3a: Secondary | ICD-10-CM | POA: Insufficient documentation

## 2022-10-16 DIAGNOSIS — Z800 Family history of malignant neoplasm of digestive organs: Secondary | ICD-10-CM | POA: Insufficient documentation

## 2022-10-16 DIAGNOSIS — Z90710 Acquired absence of both cervix and uterus: Secondary | ICD-10-CM | POA: Insufficient documentation

## 2022-10-16 NOTE — Assessment & Plan Note (Signed)
Diagnosis of neuroendocrine carcinoma of small intestine was reviewed and discussed with patient. Status post resection.  I recommend surveillance. Plan to repeat labs including biochemical markers in 3 months.  Repeat imaging

## 2022-10-16 NOTE — Assessment & Plan Note (Signed)
Discussed with patient

## 2022-10-16 NOTE — Progress Notes (Signed)
Removed staples from pt's abdomen. Pt tolerated well.

## 2022-10-16 NOTE — Progress Notes (Signed)
Hematology/Oncology Progress note [phone] [phone]     CHIEF COMPLAINTS/REASON FOR VISIT:  Small bowel neuroendocrine carcinoma.  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 Status post resection.  I recommend surveillance. Plan to repeat labs including biochemical markers in 3 months.  Repeat imaging  Goals of care, counseling/discussion Discussed with patient.   Orders Placed This Encounter  Procedures   CBC with Differential ([place] Only)    Standing Status:   Future    Standing Expiration Date:   10/17/2023   CMP ([place] only)    Standing Status:   Future    Standing Expiration Date:   10/17/2023   Chromogranin A    Standing Status:   Future    Standing Expiration Date:   10/16/2023    All questions were answered. The patient knows to call the clinic with any problems, questions or concerns.  Earlie Server, MD, PhD [facility] Hematology Oncology 10/16/2022     HISTORY OF PRESENTING ILLNESS:   Oncology History  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[place])  11/17/2021 Imaging   CT abdomen pelvis without contrast was obtained for further evaluation.  CT showed no acute abnormality of the abdomen or pelvis.  Scattered sigmoid colonic diverticulosis without findings of acute diverticulitis.  Nonspecific prominent/mildly enlarged lymph nodes along the mesenteric root measuring up to 13 mm in short axis previously measuring up to 9 mm.  Constipation.     05/01/2022 Imaging   CT abdomen pelvis without contrast 1. New circumferential wall thickening of the right colon suspicious for malignancy. 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 2. Possible underlying polyposis syndrome with suggested numerous filling defects within the small bowel  in the pelvis. 3. No evidence of bowel obstruction or perforation. 4. No definite signs of extranodal metastatic disease on noncontrast imaging. 5. Further evaluation with colonoscopy and/or PET-CT recommended.   05/31/2022 Procedure   Colonoscopy showed multiple polyps resected. Tubular adenoma. No malignancy    06/18/2022 Imaging   PET scan showed 1. No abnormal radiotracer uptake identified within the enlarged mesenteric lymph nodes. Etiology of the enlarged lymph nodes is indeterminate. If there is biochemical concern for indolent neoplastic process such as carcinoid tumor consider further evaluation with DOTATATE PET-CT. 2. No abnormal radiotracer uptake identified within the small or large bowel loops. 3. Right renal cyst. 4. Coronary artery calcifications. 5.  Aortic Atherosclerosis   07/11/2022 Tumor Marker   Chromogranin A level  191.5 24-hour 5 HIAA level was ordered, patient did not submit urine sample for analysis prior to the surgery   08/02/2022 Imaging   Dotatate PET scan showed 1. Multiple intensely radiotracer avid small bowel lesions which are essentially occult by noncontrast CT imaging. Favor multifocal small bowel neuroendocrine tumor (well differentiated). No evidence of bowel obstruction. 2. Several central mesenteric intensely radiotracer avid well differentiated neuroendocrine tumor metastasis. 3. No liver metastasis or distant metastatic disease. 4. No neuroendocrine tumor identified within the colon   10/02/2022 Initial Diagnosis   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[place])   10/02/2022 Cancer Staging   Staging form: Small Intestine - Other Histologies, AJCC 8th Edition - Pathologic stage from 10/02/2022: pT4, pN2, cM0 - Signed by Earlie Server, MD on 10/16/2022 Stage prefix: Initial diagnosis Total positive nodes: 33 Total nodes examined: 59 Histologic grade (G): G2 Histologic grading system: 4 grade system  10/02/2022 Surgery   Small bowel resection  showed well-differentiated neuroendocrine tumor, WHO grade 2, multifocal 33 of 59 lymph nodes involved by metastatic carcinoma, measuring up to 7 mm in greatest extent.  With extranodal extension Surgical margins are negative for malignancy. pT4 pN2  Appendix-benign vermiform appendix with no significant histopathologic changes.  Negative for dysplasia and malignancy.     INTERVAL HISTORY Donna Ferguson is a 82 y.o. female who has above history reviewed by me today presents for follow up visit for neuroendocrine carcinoma of small intestine.  During the interval, patient is status post small intestine neuroendocrine carcinoma resection. pT4 pN2 She recovers well after surgery.  Today she denies any abdominal pain.  Bowel habits are different.  Some diarrhea. She was accompanied by daughter.  No new complaints.    Review of Systems  Constitutional:  Negative for appetite change, chills, fatigue and fever.  HENT:   Negative for hearing loss and voice change.   Eyes:  Negative for eye problems.  Respiratory:  Negative for chest tightness and cough.   Cardiovascular:  Negative for chest pain.  Gastrointestinal:  Positive for diarrhea. Negative for abdominal distention, abdominal pain and blood in stool.  Endocrine: Negative for hot flashes.  Genitourinary:  Negative for difficulty urinating and frequency.   Musculoskeletal:  Negative for arthralgias.  Skin:  Negative for itching and rash.  Neurological:  Negative for extremity weakness.  Hematological:  Negative for adenopathy.  Psychiatric/Behavioral:  Negative for confusion.     MEDICAL HISTORY:  Past Medical History:  Diagnosis Date   Aortic atherosclerosis (HCC)    Arthritis    Chronic kidney disease, stage 3a ([place])    Constipation    Diastolic dysfunction [id number]   a.) TTE 01/04/2020: EF 60-65%, mild LVH, triv MR, mild-mod AoV sclerosis without stenosis, G1DD.   Diverticulosis    Dyspnea    Heart murmur    History of  colonoscopy    2019 in [place]; patient states no longer screening for colon cancer.   Hypertension    IBS (irritable bowel syndrome)    Lower back pain    Pneumonia    PONV (postoperative nausea and vomiting)    Pre-diabetes    Renal cyst    Right leg DVT ([place])     SURGICAL HISTORY: Past Surgical History:  Procedure Laterality Date   ABDOMINAL HYSTERECTOMY     unsure if has ovaries.    BOWEL RESECTION N/A 10/02/2022   Procedure: SMALL BOWEL RESECTION, open, possible colectomy, RNFA to assist;  Surgeon: Jules Husbands, MD;  Location: [facility] ORS;  Service: General;  Laterality: N/A;   BREAST CYST EXCISION Bilateral    BREAST EXCISIONAL BIOPSY Bilateral late 80s, early 90s    benign   BREAST SURGERY     COLONOSCOPY     COLONOSCOPY WITH PROPOFOL N/A 05/31/2022   Procedure: COLONOSCOPY WITH PROPOFOL;  Surgeon: Lin Landsman, MD;  Location: [facility] ENDOSCOPY;  Service: Gastroenterology;  Laterality: N/A;   JOINT REPLACEMENT     REPLACEMENT TOTAL KNEE Left 2017   TOTAL KNEE ARTHROPLASTY Right 12/28/2021   Procedure: TOTAL KNEE ARTHROPLASTY;  Surgeon: Corky Mull, MD;  Location: [facility] ORS;  Service: Orthopedics;  Laterality: Right;    SOCIAL HISTORY: Social History   Socioeconomic History   Marital status: Married    Spouse name: Roosevelt   Number of children: 2   Years of education: Not on file   Highest education level: Not on file  Occupational History  Not on file  Tobacco Use   Smoking status: Never    Passive exposure: Past   Smokeless tobacco: Never  Vaping Use   Vaping Use: Never used  Substance and Sexual Activity   Alcohol use: Never   Drug use: Never   Sexual activity: Yes    Birth control/protection: Surgical  Other Topics Concern   Not on file  Social History Narrative   [place] in [place]- Retired   2 children son & daughter   Married   From [place] by way of [place]   Retired 2011.    Enjoys painting    Social Determinants of Health   Financial Resource Strain: Low Risk  (08/06/2022)   Overall Financial Resource Strain (CARDIA)    Difficulty of Paying Living Expenses: Not hard at all  Food Insecurity: No Food Insecurity (10/05/2022)   Hunger Vital Sign    Worried About Running Out of Food in the Last Year: Never true    Ran Out of Food in the Last Year: Never true  Transportation Needs: No Transportation Needs (10/05/2022)   PRAPARE - Hydrologist (Medical): No    Lack of Transportation (Non-Medical): No  Physical Activity: Not on file  Stress: No Stress Concern Present (08/06/2022)   [place]    Feeling of Stress : Not at all  Social Connections: Unknown (08/06/2022)   Social Connection and Isolation Panel [NHANES]    Frequency of Communication with Friends and Family: Not on file    Frequency of Social Gatherings with Friends and Family: Not on file    Attends Religious Services: Not on file    Active Member of Clubs or Organizations: Not on file    Attends Archivist Meetings: Not on file    Marital Status: Married  Intimate Partner Violence: Not At Risk (10/02/2022)   Humiliation, Afraid, Rape, and Kick questionnaire    Fear of Current or Ex-Partner: No    Emotionally Abused: No    Physically Abused: No    Sexually Abused: No    FAMILY HISTORY: Family History  Problem Relation Age of Onset   Hypertension Mother    Diabetes Father    Hypertension Father    Heart disease Father    Hypertension Sister    Throat cancer Sister    Hypertension Daughter    Breast cancer Neg Hx     ALLERGIES:  has No Known Allergies.  MEDICATIONS:  Current Outpatient Medications  Medication Sig Dispense Refill   amLODipine (NORVASC) 5 MG tablet TAKE 1 TABLET(5 MG) BY MOUTH EVERY EVENING 90 tablet 3   cholecalciferol (VITAMIN D3) 25 MCG (1000 UNIT) tablet Take 1,000 Units by mouth  daily.     ibuprofen (ADVIL) 600 MG tablet Take 1 tablet (600 mg total) by mouth every 6 (six) hours as needed. 30 tablet 0   losartan (COZAAR) 25 MG tablet TAKE 1 TABLET(25 MG) BY MOUTH DAILY 90 tablet 3   metoprolol succinate (TOPROL-XL) 50 MG 24 hr tablet Take 1 tablet (50 mg total) by mouth daily. Take with or immediately following a meal. 90 tablet 3   Trospium Chloride 60 MG CP24 TAKE 1 CAPSULE(60 MG) BY MOUTH EVERY MORNING 90 capsule 1   vitamin C (ASCORBIC ACID) 500 MG tablet Take 500 mg by mouth daily.     No current facility-administered medications for this visit.     PHYSICAL  EXAMINATION: ECOG PERFORMANCE STATUS: 0 - Asymptomatic Vitals:   10/16/22 1322  BP: 136/73  Pulse: 83  Resp: 18  Temp: (!) 96.7 F (35.9 C)   Filed Weights   10/16/22 1322  Weight: 150 lb 1.6 oz (68.1 kg)    Physical Exam Constitutional:      General: She is not in acute distress. HENT:     Head: Normocephalic and atraumatic.  Eyes:     General: No scleral icterus. Cardiovascular:     Rate and Rhythm: Normal rate.  Pulmonary:     Effort: Pulmonary effort is normal. No respiratory distress.  Abdominal:     General: There is no distension.  Musculoskeletal:        General: No deformity. Normal range of motion.     Cervical back: Normal range of motion and neck supple.  Skin:    General: Skin is warm and dry.     Findings: No erythema or rash.  Neurological:     Mental Status: She is alert and oriented to person, place, and time. Mental status is at baseline.     Cranial Nerves: No cranial nerve deficit.  Psychiatric:        Mood and Affect: Mood normal.     LABORATORY DATA:  I have reviewed the data as listed    Latest Ref Rng & Units 10/04/2022    5:28 AM 10/03/2022    6:05 AM 10/02/2022    3:32 PM  CBC  WBC 4.0 - 10.5 K/uL 12.4  14.6  15.4   Hemoglobin 12.0 - 15.0 g/dL 11.8  12.8  14.7   Hematocrit 36.0 - 46.0 % 35.3  38.9  45.4   Platelets 150 - 400 K/uL 222  182  266        Latest Ref Rng & Units 10/03/2022    6:05 AM 10/02/2022    2:39 PM 05/03/2022    9:46 AM  CMP  Glucose 70 - 99 mg/dL 118   113   BUN 8 - 23 mg/dL 17   19   Creatinine 0.44 - 1.00 mg/dL 1.12  1.14  0.93   Sodium 135 - 145 mmol/L 135   138   Potassium 3.5 - 5.1 mmol/L 4.1   4.0   Chloride 98 - 111 mmol/L 107   106   CO2 22 - 32 mmol/L 19   25   Calcium 8.9 - 10.3 mg/dL 8.6   9.6   Total Protein 6.5 - 8.1 g/dL   7.9   Total Bilirubin 0.3 - 1.2 mg/dL   0.6   Alkaline Phos 38 - 126 U/L   94   AST 15 - 41 U/L   20   ALT 0 - 44 U/L   11       RADIOGRAPHIC STUDIES: I have personally reviewed the radiological images as listed and agreed with the findings in the report. No results found.

## 2022-10-16 NOTE — Progress Notes (Signed)
Pt here for follow up. Reports that bowels have been loose since after surgery.

## 2022-10-22 ENCOUNTER — Encounter: Payer: Self-pay | Admitting: Surgery

## 2022-10-22 ENCOUNTER — Ambulatory Visit (INDEPENDENT_AMBULATORY_CARE_PROVIDER_SITE_OTHER): Payer: Medicare Other | Admitting: Surgery

## 2022-10-22 ENCOUNTER — Other Ambulatory Visit: Payer: Self-pay

## 2022-10-22 VITALS — BP 132/79 | HR 77 | Temp 99.1°F | Ht 63.0 in | Wt 146.0 lb

## 2022-10-22 DIAGNOSIS — C7A8 Other malignant neuroendocrine tumors: Secondary | ICD-10-CM

## 2022-10-22 DIAGNOSIS — Z09 Encounter for follow-up examination after completed treatment for conditions other than malignant neoplasm: Secondary | ICD-10-CM

## 2022-10-22 NOTE — Patient Instructions (Addendum)
Try imodium for the loose stool. You may need to alter your dose depending on your stool.  We will contact you the end of April to schedule a follow up for May. If you do not hear from our office please call and schedule this appointment in May.

## 2022-10-25 NOTE — Progress Notes (Signed)
Donna Ferguson is an 82 year old female status post open small bowel resection for neuroendocrine tumor.  She has done remarkably well.  She has some loose stools.  No fevers no chills.  Pathology discussed with her in detail. T4N2 M0 No need for further interventions  PE: NAD, alert Abd: soft, nt, incision healing well, no infection or peritonitis  A/P doing very well She is very appreciative Rtc 3 months

## 2022-12-05 ENCOUNTER — Other Ambulatory Visit: Payer: Self-pay | Admitting: Family

## 2022-12-05 DIAGNOSIS — I10 Essential (primary) hypertension: Secondary | ICD-10-CM

## 2022-12-24 ENCOUNTER — Encounter: Payer: Self-pay | Admitting: Surgery

## 2022-12-24 ENCOUNTER — Ambulatory Visit (INDEPENDENT_AMBULATORY_CARE_PROVIDER_SITE_OTHER): Payer: Medicare Other | Admitting: Surgery

## 2022-12-24 VITALS — BP 137/79 | HR 73 | Temp 98.5°F | Ht 63.0 in | Wt 149.6 lb

## 2022-12-24 DIAGNOSIS — Z08 Encounter for follow-up examination after completed treatment for malignant neoplasm: Secondary | ICD-10-CM

## 2022-12-24 DIAGNOSIS — C7A8 Other malignant neuroendocrine tumors: Secondary | ICD-10-CM

## 2022-12-24 NOTE — Patient Instructions (Signed)
Follow-up with our office as needed. ? ?Please call and ask to speak with a nurse if you develop questions or concerns. ? ? ?GENERAL POST-OPERATIVE ?PATIENT INSTRUCTIONS  ? ?WOUND CARE INSTRUCTIONS:  Keep a dry clean dressing on the wound if there is drainage. The initial bandage may be removed after 24 hours.  Once the wound has quit draining you may leave it open to air.  If clothing rubs against the wound or causes irritation and the wound is not draining you may cover it with a dry dressing during the daytime.  Try to keep the wound dry and avoid ointments on the wound unless directed to do so.  If the wound becomes bright red and painful or starts to drain infected material that is not clear, please contact your physician immediately.  If the wound is mildly pink and has a thick firm ridge underneath it, this is normal, and is referred to as a healing ridge.  This will resolve over the next 4-6 weeks. ? ?BATHING: ?You may shower if you have been informed of this by your surgeon. However, Please do not submerge in a tub, hot tub, or pool until incisions are completely sealed or have been told by your surgeon that you may do so. ? ?DIET:  You may eat any foods that you can tolerate.  It is a good idea to eat a high fiber diet and take in plenty of fluids to prevent constipation.  If you do become constipated you may want to take a mild laxative or take ducolax tablets on a daily basis until your bowel habits are regular.  Constipation can be very uncomfortable, along with straining, after recent surgery. ? ?ACTIVITY:  You are encouraged to cough and deep breath or use your incentive spirometer if you were given one, every 15-30 minutes when awake.  This will help prevent respiratory complications and low grade fevers post-operatively if you had a general anesthetic.  You may want to hug a pillow when coughing and sneezing to add additional support to the surgical area, if you had abdominal or chest surgery, which  will decrease pain during these times.  You are encouraged to walk and engage in light activity for the next two weeks.  You should not lift more than 20 pounds for 6 weeks total after surgery as it could put you at increased risk for complications.  Twenty pounds is roughly equivalent to a plastic bag of groceries. At that time- Listen to your body when lifting, if you have pain when lifting, stop and then try again in a few days. Soreness after doing exercises or activities of daily living is normal as you get back in to your normal routine. ? ?MEDICATIONS:  Try to take narcotic medications and anti-inflammatory medications, such as tylenol, ibuprofen, naprosyn, etc., with food.  This will minimize stomach upset from the medication.  Should you develop nausea and vomiting from the pain medication, or develop a rash, please discontinue the medication and contact your physician.  You should not drive, make important decisions, or operate machinery when taking narcotic pain medication. ? ?SUNBLOCK ?Use sun block to incision area over the next year if this area will be exposed to sun. This helps decrease scarring and will allow you avoid a permanent darkened area over your incision. ? ?QUESTIONS:  Please feel free to call our office if you have any questions, and we will be glad to assist you. [phone] ? ? ?

## 2022-12-24 NOTE — Progress Notes (Signed)
  Surgical Consultation  12/24/2022  Noel Schwartzenberge is an 82 y.o. female.   Chief Complaint  Patient presents with  . Follow-up    SB resection 10/02/22     HPI: ***  Past Medical History:  Diagnosis Date  . Aortic atherosclerosis (HCC)   . Arthritis   . Chronic kidney disease, stage 3a (HCC)   . Constipation   . Diastolic dysfunction 01/04/2020   a.) TTE 01/04/2020: EF 60-65%, mild LVH, triv MR, mild-mod AoV sclerosis without stenosis, G1DD.  [place] Kitchen Diverticulosis   . Dyspnea   . Heart murmur   . History of colonoscopy    2019 in [place]; patient states no longer screening for colon cancer.  . Hypertension   . IBS (irritable bowel syndrome)   . Lower back pain   . Pneumonia   . PONV (postoperative nausea and vomiting)   . Pre-diabetes   . Renal cyst   . Right leg DVT [facility])     Past Surgical History:  Procedure Laterality Date  . ABDOMINAL HYSTERECTOMY     unsure if has ovaries.   . BOWEL RESECTION N/A 10/02/2022   Procedure: SMALL BOWEL RESECTION, open, possible colectomy, RNFA to assist;  Surgeon: Leafy Ro, MD;  Location: [facility] ORS;  Service: General;  Laterality: N/A;  . BREAST CYST EXCISION Bilateral   . BREAST EXCISIONAL BIOPSY Bilateral late 80s, early 90s    benign  . BREAST SURGERY    . COLONOSCOPY    . COLONOSCOPY WITH PROPOFOL N/A 05/31/2022   Procedure: COLONOSCOPY WITH PROPOFOL;  Surgeon: Toney Reil, MD;  Location: [facility] ENDOSCOPY;  Service: Gastroenterology;  Laterality: N/A;  . JOINT REPLACEMENT    . REPLACEMENT TOTAL KNEE Left 2017  . TOTAL KNEE ARTHROPLASTY Right 12/28/2021   Procedure: TOTAL KNEE ARTHROPLASTY;  Surgeon: Christena Flake, MD;  Location: [facility] ORS;  Service: Orthopedics;  Laterality: Right;    Family History  Problem Relation Age of Onset  . Hypertension Mother   . Diabetes Father   . Hypertension Father   . Heart disease Father   . Hypertension Sister   . Throat cancer Sister   . Hypertension Daughter   . Breast cancer Neg  Hx     Social History:  reports that she has never smoked. She has been exposed to tobacco smoke. She has never used smokeless tobacco. She reports that she does not drink alcohol and does not use drugs.  Allergies: No Known Allergies  Medications reviewed.     ROS Full ROS performed and is otherwise negative other than what is stated in the HPI    BP 137/79   Pulse 73   Temp 98.5 F (36.9 C) (Oral)   Ht 5\' 3"  (1.6 m)   Wt 149 lb 9.6 oz (67.9 kg)   SpO2 97%   BMI 26.50 kg/m   Physical Exam    No results found for this or any previous visit (from the past 48 hour(s)). No results found.  Assessment/Plan: There are no diagnoses linked to this encounter.  Sterling Big, MD FACS General Surgeon dermatitis

## 2022-12-25 ENCOUNTER — Encounter: Payer: Self-pay | Admitting: Family

## 2023-01-07 ENCOUNTER — Other Ambulatory Visit: Payer: Self-pay

## 2023-01-07 DIAGNOSIS — I10 Essential (primary) hypertension: Secondary | ICD-10-CM

## 2023-01-07 MED ORDER — LOSARTAN POTASSIUM 25 MG PO TABS: ORAL_TABLET | ORAL | 3 refills | Status: DC

## 2023-01-15 ENCOUNTER — Inpatient Hospital Stay: Payer: Medicare Other | Attending: Oncology

## 2023-01-15 DIAGNOSIS — R591 Generalized enlarged lymph nodes: Secondary | ICD-10-CM

## 2023-01-15 DIAGNOSIS — C7A8 Other malignant neuroendocrine tumors: Secondary | ICD-10-CM | POA: Diagnosis present

## 2023-01-15 LAB — CMP (CANCER CENTER ONLY)
ALT: 12 U/L (ref 0–44)
AST: 20 U/L (ref 15–41)
Albumin: 3.5 g/dL (ref 3.5–5.0)
Alkaline Phosphatase: 107 U/L (ref 38–126)
Anion gap: 10 (ref 5–15)
BUN: 20 mg/dL (ref 8–23)
CO2: 25 mmol/L (ref 22–32)
Calcium: 9.2 mg/dL (ref 8.9–10.3)
Chloride: 101 mmol/L (ref 98–111)
Creatinine: 1.02 mg/dL — ABNORMAL HIGH (ref 0.44–1.00)
GFR, Estimated: 56 mL/min — ABNORMAL LOW (ref >60)
Glucose, Bld: 102 mg/dL — ABNORMAL HIGH (ref 70–99)
Potassium: 4.3 mmol/L (ref 3.5–5.1)
Sodium: 136 mmol/L (ref 135–145)
Total Bilirubin: 0.4 mg/dL (ref 0.3–1.2)
Total Protein: 7.6 g/dL (ref 6.5–8.1)

## 2023-01-15 LAB — CBC WITH DIFFERENTIAL (CANCER CENTER ONLY)
Abs Immature Granulocytes: 0.03 10*3/uL (ref 0.00–0.07)
Basophils Absolute: 0.1 10*3/uL (ref 0.0–0.1)
Basophils Relative: 1 %
Eosinophils Absolute: 0.1 10*3/uL (ref 0.0–0.5)
Eosinophils Relative: 1 %
HCT: 38 % (ref 36.0–46.0)
Hemoglobin: 12.1 g/dL (ref 12.0–15.0)
Immature Granulocytes: 0 %
Lymphocytes Relative: 35 %
Lymphs Abs: 3.6 10*3/uL (ref 0.7–4.0)
MCH: 28.1 pg (ref 26.0–34.0)
MCHC: 31.8 g/dL (ref 30.0–36.0)
MCV: 88.4 fL (ref 80.0–100.0)
Monocytes Absolute: 0.8 10*3/uL (ref 0.1–1.0)
Monocytes Relative: 8 %
Neutro Abs: 5.7 10*3/uL (ref 1.7–7.7)
Neutrophils Relative %: 55 %
Platelet Count: 316 10*3/uL (ref 150–400)
RBC: 4.3 MIL/uL (ref 3.87–5.11)
RDW: 13.8 % (ref 11.5–15.5)
WBC Count: 10.2 10*3/uL (ref 4.0–10.5)
nRBC: 0 % (ref 0.0–0.2)

## 2023-01-17 LAB — CHROMOGRANIN A: Chromogranin A (ng/mL): 122.4 ng/mL — ABNORMAL HIGH (ref 0.0–101.8)

## 2023-01-22 ENCOUNTER — Encounter: Payer: Self-pay | Admitting: Oncology

## 2023-01-22 ENCOUNTER — Inpatient Hospital Stay: Payer: Medicare Other | Attending: Oncology | Admitting: Oncology

## 2023-01-22 VITALS — BP 122/63 | HR 71 | Temp 97.8°F | Resp 18 | Wt 149.5 lb

## 2023-01-22 DIAGNOSIS — C7B8 Other secondary neuroendocrine tumors: Secondary | ICD-10-CM | POA: Diagnosis not present

## 2023-01-22 DIAGNOSIS — Z90710 Acquired absence of both cervix and uterus: Secondary | ICD-10-CM | POA: Insufficient documentation

## 2023-01-22 DIAGNOSIS — K219 Gastro-esophageal reflux disease without esophagitis: Secondary | ICD-10-CM | POA: Insufficient documentation

## 2023-01-22 DIAGNOSIS — C7A8 Other malignant neuroendocrine tumors: Secondary | ICD-10-CM | POA: Diagnosis not present

## 2023-01-22 DIAGNOSIS — Z800 Family history of malignant neoplasm of digestive organs: Secondary | ICD-10-CM | POA: Insufficient documentation

## 2023-01-22 NOTE — Progress Notes (Signed)
Hematology/Oncology Progress note [phone] [phone]     CHIEF COMPLAINTS/REASON FOR VISIT:  Small bowel neuroendocrine carcinoma.  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 Status post resection.  I recommend surveillance. Repeat chromogranin a level has decreased comparing to previous of level. Discussed with the patient that chromogranin A can be affected by her acid reflux medication, food, chronic inflammation i.e. arthritis.  Will repeat a fasting chromogranin A level in 3 weeks.  Recommend patient to hold all acid reflux medication. Plan to repeat dotatate in 6 months    Orders Placed This Encounter  Procedures   NM PET DOTATATE SKULL BASE TO MID THIGH    Standing Status:   Future    Standing Expiration Date:   01/22/2024    Order Specific Question:   If indicated for the ordered procedure, I authorize the administration of a radiopharmaceutical per Radiology protocol    Answer:   Yes    Order Specific Question:   Preferred imaging location?    Answer:   [facility]   CBC with Differential (Cancer Center Only)    Standing Status:   Future    Standing Expiration Date:   01/22/2024   CMP (Cancer Center only)    Standing Status:   Future    Standing Expiration Date:   01/22/2024   Chromogranin A    Standing Status:   Future    Standing Expiration Date:   01/22/2024   Chromogranin A    Standing Status:   Future    Standing Expiration Date:   01/22/2024   5 HIAA, quantitative, urine, 24 hour    Standing Status:   Future    Standing Expiration Date:   01/22/2024   Follow-up in 6 months. All questions were answered. The patient knows to call the clinic with any problems, questions or concerns.  Rickard Patience, MD, PhD [facility] Hematology Oncology 01/22/2023     HISTORY OF PRESENTING ILLNESS:   Oncology History   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HCC)  11/17/2021 Imaging   CT abdomen pelvis without contrast was obtained for further evaluation.  CT showed no acute abnormality of the abdomen or pelvis.  Scattered sigmoid colonic diverticulosis without findings of acute diverticulitis.  Nonspecific prominent/mildly enlarged lymph nodes along the mesenteric root measuring up to 13 mm in short axis previously measuring up to 9 mm.  Constipation.     05/01/2022 Imaging   CT abdomen pelvis without contrast 1. New circumferential wall thickening of the right colon suspicious for malignancy. 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 2. 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 3. No evidence of bowel obstruction or perforation. 4. No definite signs of extranodal metastatic disease on noncontrast imaging. 5. Further evaluation with colonoscopy and/or PET-CT recommended.   05/31/2022 Procedure   Colonoscopy showed multiple polyps resected. Tubular adenoma. No malignancy    06/18/2022 Imaging   PET scan showed 1. No abnormal radiotracer uptake identified within the enlarged mesenteric lymph nodes. Etiology of the enlarged lymph nodes is indeterminate. If there is biochemical concern for indolent neoplastic process such as carcinoid tumor consider further evaluation with DOTATATE PET-CT. 2. No abnormal radiotracer uptake identified within the small or large bowel loops. 3. Right renal cyst. 4. Coronary artery calcifications. 5.  Aortic Atherosclerosis   07/11/2022  Tumor Marker   Chromogranin A level  191.5 24-hour 5 HIAA level was ordered, patient did not submit urine sample for analysis prior to the surgery   08/02/2022 Imaging   Dotatate PET scan showed 1. Multiple intensely radiotracer avid small bowel lesions which are essentially occult by  noncontrast CT imaging. Favor multifocal small bowel neuroendocrine tumor (well differentiated). No evidence of bowel obstruction. 2. Several central mesenteric intensely radiotracer avid well differentiated neuroendocrine tumor metastasis. 3. No liver metastasis or distant metastatic disease. 4. No neuroendocrine tumor identified within the colon   10/02/2022 Initial Diagnosis   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HCC)   10/02/2022 Cancer Staging   Staging form: Small Intestine - Other Histologies, AJCC 8th Edition - Pathologic stage from 10/02/2022: pT4, pN2, cM0 - Signed by Rickard Patience, MD on 10/16/2022 Stage prefix: Initial diagnosis Total positive nodes: 33 Total nodes examined: 59 Histologic grade (G): G2 Histologic grading system: 4 grade system   10/02/2022 Surgery   Small bowel resection showed well-differentiated neuroendocrine tumor, WHO grade 2, multifocal 33 of 59 lymph nodes involved by metastatic carcinoma, measuring up to 7 mm in greatest extent.  With extranodal extension Surgical margins are negative for malignancy. pT4 pN2  Appendix-benign vermiform appendix with no significant histopathologic changes.  Negative for dysplasia and malignancy.     INTERVAL HISTORY Donna Ferguson is a 82 y.o. female who has above history reviewed by me today presents for follow up visit for neuroendocrine carcinoma of small intestine.  During the interval, patient is status post small intestine neuroendocrine carcinoma resection. pT4 pN2 She recovers well after surgery.  Today she denies any abdominal pain.  Bowel habits are different.   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  She uses Imodium as needed with relief of symptoms.    Review of Systems  Constitutional:  Negative for appetite change, chills, fatigue and fever.  HENT:   Negative for hearing loss and voice change.   Eyes:  Negative for eye problems.  Respiratory:  Negative for chest tightness  and cough.   Cardiovascular:  Negative for chest pain.  Gastrointestinal:  Positive for diarrhea. Negative for abdominal distention, abdominal pain and blood in stool.  Endocrine: Negative for hot flashes.  Genitourinary:  Negative for difficulty urinating and frequency.   Musculoskeletal:  Negative for arthralgias.  Skin:  Negative for itching and rash.  Neurological:  Negative for extremity weakness.  Hematological:  Negative for adenopathy.  Psychiatric/Behavioral:  Negative for confusion.     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   Diverticulosis    Dyspnea    Heart murmur    History of colonoscopy    2019 in [place]; patient states no longer screening for colon cancer.   Hypertension    IBS (irritable bowel syndrome)    Lower back pain    Pneumonia    PONV (postoperative nausea and vomiting)    Pre-diabetes    Renal cyst    Right leg DVT (HCC)     SURGICAL HISTORY: Past Surgical History:  Procedure Laterality Date   ABDOMINAL HYSTERECTOMY     unsure if has ovaries.    BOWEL RESECTION N/A 10/02/2022   Procedure: SMALL BOWEL RESECTION, open,  possible colectomy, RNFA to assist;  Surgeon: Leafy Ro, MD;  Location: [facility] ORS;  Service: General;  Laterality: N/A;   BREAST CYST EXCISION Bilateral    BREAST EXCISIONAL BIOPSY Bilateral late 80s, early 90s    benign   BREAST SURGERY     COLONOSCOPY     COLONOSCOPY WITH PROPOFOL N/A 05/31/2022   Procedure: COLONOSCOPY WITH PROPOFOL;  Surgeon: Toney Reil, MD;  Location: [facility] ENDOSCOPY;  Service: Gastroenterology;  Laterality: N/A;   JOINT REPLACEMENT     REPLACEMENT TOTAL KNEE Left 2017   TOTAL KNEE ARTHROPLASTY Right 12/28/2021   Procedure: TOTAL KNEE  ARTHROPLASTY;  Surgeon: Christena Flake, MD;  Location: [facility] ORS;  Service: Orthopedics;  Laterality: Right;    SOCIAL HISTORY: Social History   Socioeconomic History   Marital status: Married    Spouse name: Roosevelt   Number of children: 2   Years of education: Not on file   Highest education level: Not on file  Occupational History   Not on file  Tobacco Use   Smoking status: Never    Passive exposure: Past   Smokeless tobacco: Never  Vaping Use   Vaping Use: Never used  Substance and Sexual Activity   Alcohol use: Never   Drug use: Never   Sexual activity: Yes    Birth control/protection: Surgical  Other Topics Concern   Not on file  Social History Narrative   [employer] in [place]- Retired   2 children son & daughter   Married   From [place] by way of [place]   Retired 2011.    Enjoys painting   Social Determinants of Health   Financial Resource Strain: Low Risk  (08/06/2022)   Overall Financial Resource Strain (CARDIA)    Difficulty of Paying Living Expenses: Not hard at all  Food Insecurity: No Food Insecurity (10/05/2022)   Hunger Vital Sign    Worried About Running Out of Food in the Last Year: Never true    Ran Out of Food in the Last Year: Never true  Transportation Needs: No Transportation Needs (10/05/2022)   PRAPARE - Administrator, Civil Service (Medical): No    Lack of Transportation (Non-Medical): No  Physical Activity: Not on file  Stress: No Stress Concern Present (08/06/2022)   [employer] of Occupational Health - Occupational Stress Questionnaire    Feeling of Stress : Not at all  Social Connections: Unknown (08/06/2022)   Social Connection and Isolation Panel [NHANES]    Frequency of Communication with Friends and Family: Not on file    Frequency of Social Gatherings with Friends and Family: Not on file    Attends Religious Services: Not on file    Active Member of Clubs or  Organizations: Not on file    Attends Banker Meetings: Not on file    Marital Status: Married  Intimate Partner Violence: Not At Risk (10/02/2022)   Humiliation, Afraid, Rape, and Kick questionnaire    Fear of Current or Ex-Partner: No    Emotionally Abused: No    Physically Abused: No    Sexually Abused: No    FAMILY HISTORY: Family History  Problem Relation Age of Onset   Hypertension Mother    Diabetes Father    Hypertension Father    Heart disease Father    Hypertension Sister    Throat cancer Sister    Hypertension Daughter    Breast cancer Neg Hx  ALLERGIES:  has No Known Allergies.  MEDICATIONS:  Current Outpatient Medications  Medication Sig Dispense Refill   amLODipine (NORVASC) 5 MG tablet TAKE 1 TABLET(5 MG) BY MOUTH EVERY EVENING 90 tablet 3   cholecalciferol (VITAMIN D3) 25 MCG (1000 UNIT) tablet Take 1,000 Units by mouth daily.     ibuprofen (ADVIL) 600 MG tablet Take 1 tablet (600 mg total) by mouth every 6 (six) hours as needed. 30 tablet 0   losartan (COZAAR) 25 MG tablet TAKE 1 TABLET(25 MG) BY MOUTH DAILY 90 tablet 3   metoprolol succinate (TOPROL-XL) 50 MG 24 hr tablet TAKE 1 TABLET(50 MG) BY MOUTH DAILY WITH OR IMMEDIATELY FOLLOWING A MEAL 90 tablet 3   Trospium Chloride 60 MG CP24 TAKE 1 CAPSULE(60 MG) BY MOUTH EVERY MORNING 90 capsule 1   vitamin C (ASCORBIC ACID) 500 MG tablet Take 500 mg by mouth daily.     No current facility-administered medications for this visit.     PHYSICAL EXAMINATION: ECOG PERFORMANCE STATUS: 0 - Asymptomatic Vitals:   01/22/23 1039  BP: 122/63  Pulse: 71  Resp: 18  Temp: 97.8 F (36.6 C)   Filed Weights   01/22/23 1039  Weight: 149 lb 8 oz (67.8 kg)    Physical Exam Constitutional:      General: She is not in acute distress. HENT:     Head: Normocephalic and atraumatic.  Eyes:     General: No scleral icterus. Cardiovascular:     Rate and Rhythm: Normal rate.  Pulmonary:     Effort:  Pulmonary effort is normal. No respiratory distress.  Abdominal:     General: There is no distension.  Musculoskeletal:        General: No deformity. Normal range of motion.     Cervical back: Normal range of motion and neck supple.  Skin:    General: Skin is warm and dry.     Findings: No erythema or rash.  Neurological:     Mental Status: She is alert and oriented to person, place, and time. Mental status is at baseline.     Cranial Nerves: No cranial nerve deficit.  Psychiatric:        Mood and Affect: Mood normal.     LABORATORY DATA:  I have reviewed the data as listed    Latest Ref Rng & Units 01/15/2023   10:06 AM 10/04/2022    5:28 AM 10/03/2022    6:05 AM  CBC  WBC 4.0 - 10.5 K/uL 10.2  12.4  14.6   Hemoglobin 12.0 - 15.0 g/dL 16.1  09.6  04.5   Hematocrit 36.0 - 46.0 % 38.0  35.3  38.9   Platelets 150 - 400 K/uL 316  222  182       Latest Ref Rng & Units 01/15/2023   10:06 AM 10/03/2022    6:05 AM 10/02/2022    2:39 PM  CMP  Glucose 70 - 99 mg/dL 409  811    BUN 8 - 23 mg/dL 20  17    Creatinine 9.14 - 1.00 mg/dL 7.82  9.56  2.13   Sodium 135 - 145 mmol/L 136  135    Potassium 3.5 - 5.1 mmol/L 4.3  4.1    Chloride 98 - 111 mmol/L 101  107    CO2 22 - 32 mmol/L 25  19    Calcium 8.9 - 10.3 mg/dL 9.2  8.6    Total Protein 6.5 - 8.1 g/dL 7.6  Total Bilirubin 0.3 - 1.2 mg/dL 0.4     Alkaline Phos 38 - 126 U/L 107     AST 15 - 41 U/L 20     ALT 0 - 44 U/L 12         RADIOGRAPHIC STUDIES: I have personally reviewed the radiological images as listed and agreed with the findings in the report. No results found.

## 2023-01-22 NOTE — Assessment & Plan Note (Addendum)
Diagnosis of neuroendocrine carcinoma of small intestine was reviewed and discussed with patient. Status post resection.  I recommend surveillance. Repeat chromogranin a level has decreased comparing to previous of level. Discussed with the patient that chromogranin A can be affected by her acid reflux medication, food, chronic inflammation i.e. arthritis. Will repeat a fasting chromogranin A level in 3 weeks. Recommend patient to hold all acid reflux medication   Plan to repeat dotatate in 6 months

## 2023-02-05 ENCOUNTER — Encounter: Payer: Self-pay | Admitting: Family

## 2023-02-05 ENCOUNTER — Ambulatory Visit (INDEPENDENT_AMBULATORY_CARE_PROVIDER_SITE_OTHER): Payer: Medicare Other | Admitting: Family

## 2023-02-05 VITALS — BP 122/82 | HR 72 | Temp 98.0°F | Ht 63.0 in | Wt 151.6 lb

## 2023-02-05 DIAGNOSIS — I10 Essential (primary) hypertension: Secondary | ICD-10-CM

## 2023-02-05 DIAGNOSIS — N3281 Overactive bladder: Secondary | ICD-10-CM | POA: Diagnosis not present

## 2023-02-05 DIAGNOSIS — R21 Rash and other nonspecific skin eruption: Secondary | ICD-10-CM

## 2023-02-05 DIAGNOSIS — K5900 Constipation, unspecified: Secondary | ICD-10-CM

## 2023-02-05 DIAGNOSIS — H539 Unspecified visual disturbance: Secondary | ICD-10-CM | POA: Diagnosis not present

## 2023-02-05 DIAGNOSIS — M545 Low back pain, unspecified: Secondary | ICD-10-CM

## 2023-02-05 MED ORDER — TROSPIUM CHLORIDE 20 MG PO TABS
20.0000 mg | ORAL_TABLET | Freq: Two times a day (BID) | ORAL | 2 refills | Status: DC
Start: 2023-02-05 — End: 2023-09-06

## 2023-02-05 MED ORDER — MUPIROCIN 2 % EX OINT
1.0000 | TOPICAL_OINTMENT | Freq: Two times a day (BID) | CUTANEOUS | 2 refills | Status: DC
Start: 2023-02-05 — End: 2023-09-23

## 2023-02-05 NOTE — Patient Instructions (Addendum)
Trial of bactroban for rash  Referral to nutrition   To bulk stool, you may either try Metamucil 3.4 g daily up to 3 times daily .  Or you may try Benefiber 1 tablespoon 3 times daily with meals.  Benefiber and Metamucil work in the same way. They absorb water from your intestines to form softer, bulkier stools. These stools flow more easily through your digestive system, which helps you have easier bowel movements. These supplements also increase how often you have bowel movements.  It is important to increase dietary fiber and water intake as well.  Exercises for Chronic Knee Pain Chronic knee pain is pain that lasts longer than 3 months. For most people with chronic knee pain, exercise and weight loss is an important part of treatment. Your health care provider may want you to focus on: Making the muscles that support your knee stronger. This can take pressure off your knee and reduce pain. Preventing knee stiffness. How far you can move your knee, keeping it there or making it farther. Losing weight (if this applies) to take pressure off your knee, lower your risk for injury, and make it easier for you to exercise. Your provider will help you make an exercise program that fits your needs and physical abilities. Below are simple, low-impact exercises you can do at home. Ask your provider or physical therapist how often you should do your exercise program and how many times to repeat each exercise. General safety tips  Get your provider's approval before doing any exercises. Start slowly and stop any time you feel pain. Do not exercise if your knee pain is flaring up. Warm up first. Stretching a cold muscle can cause an injury. Do 5-10 minutes of easy movement or light stretching before beginning your exercises. Do 5-10 minutes of low-impact activity (like walking or cycling) before starting strengthening exercises. Contact your provider any time you have pain during or after exercising.  Exercise can cause discomfort but should not be painful. It is normal to be a little stiff or sore after exercising. Stretching and range-of-motion exercises Front thigh stretch  Stand up straight and support your body by holding on to a chair or resting one hand on a wall. With your legs straight and close together, bend one knee to lift your heel up toward your butt. Using one hand for support, grab your ankle with your free hand. Pull your foot up closer toward your butt to feel the stretch in front of your thigh. Hold the stretch for 30 seconds. Repeat __________ times. Complete this exercise __________ times a day. Back thigh stretch  Sit on the floor with your back straight and your legs out straight in front of you. Place the palms of your hands on the floor and slide them toward your feet as you bend at the hip. Try to touch your nose to your knees and feel the stretch in the back of your thighs. Hold for 30 seconds. Repeat __________ times. Complete this exercise __________ times a day. Calf stretch  Stand facing a wall. Place the palms of your hands flat against the wall, arms extended, and lean slightly against the wall. Get into a lunge position with one leg bent at the knee and the other leg stretched out straight behind you. Keep both feet facing the wall and increase the bend in your knee while keeping the heel of the other leg flat on the ground. You should feel the stretch in your calf. Hold for  30 seconds. Repeat __________ times. Complete this exercise __________ times a day. Strengthening exercises Straight leg lift  Lie on your back with one knee bent and the other leg out straight. Slowly lift the straight leg without bending the knee. Lift until your foot is about 12 inches (30 cm) off the floor. Hold for 3-5 seconds and slowly lower your leg. Repeat __________ times. Complete this exercise __________ times a day. Single leg dip  Stand between two chairs and  put both hands on the backs of the chairs for support. Extend one leg out straight with your body weight resting on the heel of the standing leg. Slowly bend your standing knee to dip your body to the level that is comfortable for you. Hold for 3-5 seconds. Repeat __________ times. Complete this exercise __________ times a day. Hamstring curls  Stand straight, knees close together, facing the back of a chair. Hold on to the back of a chair with both hands. Keep one leg straight. Bend the other knee while bringing the heel up toward the butt until the knee is bent at a 90-degree angle (right angle). Hold for 3-5 seconds. Repeat __________ times. Complete this exercise __________ times a day. Wall squat  Stand straight with your back, hips, and head against a wall. Step forward one foot at a time with your back still against the wall. Your feet should be 2 feet (61 cm) from the wall at shoulder width. Keeping your back, hips, and head against the wall, slide down the wall to as close to a sitting position as you can get. Hold for 5-10 seconds, then slowly slide back up. Repeat __________ times. Complete this exercise __________ times a day. Step-ups  Stand in front of a sturdy platform or stool that is about 6 inches (15 cm) high. Slowly step up with your left / right foot, keeping your knee in line with your hip and foot. Do not let your knee bend so far that you cannot see your toes. Hold on to a chair for balance, but do not use it for support. Slowly unlock your knee and lower yourself to the starting position. Repeat __________ times. Complete this exercise __________ times a day. Contact a health care provider if: Your exercises cause pain. Your pain is worse after you exercise. Your pain prevents you from doing your exercises. This information is not intended to replace advice given to you by your health care provider. Make sure you discuss any questions you have with your health  care provider. Document Revised: 08/21/2022 Document Reviewed: 08/21/2022 [employer] Patient Education  2024 [employer] Hip Bursitis Rehab Ask your health care provider which exercises are safe for you. Do exercises exactly as told by your health care provider and adjust them as directed. It is normal to feel mild stretching, pulling, tightness, or discomfort as you do these exercises. Stop right away if you feel sudden pain or your pain gets worse. Do not begin these exercises until told by your health care provider. Stretching exercise This exercise warms up your muscles and joints and improves the movement and flexibility of your hip. This exercise also helps to relieve pain and stiffness. Iliotibial band stretch An iliotibial band is a strong band of muscle tissue that runs from the outer side of your hip to the outer side of your thigh and knee. Lie on your side with your left / right leg in the top position. Bend your left / right knee and grab your  ankle. Stretch out your bottom arm to help you balance. Slowly bring your knee back so your thigh is slightly behind your body. Slowly lower your knee toward the floor until you feel a gentle stretch on the outside of your left / right thigh. If you do not feel a stretch and your knee will not lower more toward the floor, place the heel of your other foot on top of your knee and pull your knee down toward the floor with your foot. Hold this position for __________ seconds. Slowly return to the starting position. Repeat __________ times. Complete this exercise __________ times a day. Strengthening exercises These exercises build strength and endurance in your hip and pelvis. Endurance is the ability to use your muscles for a long time, even after they get tired. Bridge This exercise strengthens the muscles that move your thigh backward (hip extensors). Lie on your back on a firm surface with your knees bent and your feet flat on the  floor. Tighten your buttocks muscles and lift your buttocks off the floor until your trunk is level with your thighs. Do not arch your back. You should feel the muscles working in your buttocks and the back of your thighs. If you do not feel these muscles, slide your feet 1-2 inches (2.5-5 cm) farther away from your buttocks. If this exercise is too easy, try doing it with your arms crossed over your chest. Hold this position for __________ seconds. Slowly lower your hips to the starting position. Let your muscles relax completely after each repetition. Repeat __________ times. Complete this exercise __________ times a day. Squats This exercise strengthens the muscles in front of your thigh and knee (quadriceps). Stand in front of a table, with your feet and knees pointing straight ahead. You may rest your hands on the table for balance but not for support. Slowly bend your knees and lower your hips like you are going to sit in a chair. Keep your weight over your heels, not over your toes. Keep your lower legs upright so they are parallel with the table legs. Do not let your hips go lower than your knees. Do not bend lower than told by your health care provider. If your hip pain increases, do not bend as low. Hold the squat position for __________ seconds. Slowly push with your legs to return to standing. Do not use your hands to pull yourself to standing. Repeat __________ times. Complete this exercise __________ times a day. Hip hike  Stand sideways on a bottom step. Stand on your left / right leg with your other foot unsupported next to the step. You can hold on to the railing or wall for balance if needed. Keep your knees straight and your torso square. Then lift your left / right hip up toward the ceiling. Hold this position for __________ seconds. Slowly let your left / right hip lower toward the floor, past the starting position. Your foot should get closer to the floor. Do not lean  or bend your knees. Repeat __________ times. Complete this exercise __________ times a day. Single leg stand This exercise increases your balance. Without shoes, stand near a railing or in a doorway. You may hold on to the railing or door frame as needed for balance. Squeeze your left / right buttock muscles, then lift up your other foot. Do not let your left / right hip push out to the side. It is helpful to stand in front of a mirror for this exercise  so you can watch your hip. Hold this position for __________ seconds. Repeat __________ times. Complete this exercise __________ times a day. This information is not intended to replace advice given to you by your health care provider. Make sure you discuss any questions you have with your health care provider. Document Revised: 07/19/2021 Document Reviewed: 07/19/2021 [employer] Patient Education  2024 [employer] Low Back Sprain or Strain Rehab Ask your health care provider which exercises are safe for you. Do exercises exactly as told by your health care provider and adjust them as directed. It is normal to feel mild stretching, pulling, tightness, or discomfort as you do these exercises. Stop right away if you feel sudden pain or your pain gets worse. Do not begin these exercises until told by your health care provider. Stretching and range-of-motion exercises These exercises warm up your muscles and joints and improve the movement and flexibility of your back. These exercises also help to relieve pain, numbness, and tingling. Lumbar rotation  Lie on your back on a firm bed or the floor with your knees bent. Straighten your arms out to your sides so each arm forms a 90-degree angle (right angle) with a side of your body. Slowly move (rotate) both of your knees to one side of your body until you feel a stretch in your lower back (lumbar). Try not to let your shoulders lift off the floor. Hold this position for __________ seconds. Tense your  abdominal muscles and slowly move your knees back to the starting position. Repeat this exercise on the other side of your body. Repeat __________ times. Complete this exercise __________ times a day. Single knee to chest  Lie on your back on a firm bed or the floor with both legs straight. Bend one of your knees. Use your hands to move your knee up toward your chest until you feel a gentle stretch in your lower back and buttock. Hold your leg in this position by holding on to the front of your knee. Keep your other leg as straight as possible. Hold this position for __________ seconds. Slowly return to the starting position. Repeat with your other leg. Repeat __________ times. Complete this exercise __________ times a day. Prone extension on elbows  Lie on your abdomen on a firm bed or the floor (prone position). Prop yourself up on your elbows. Use your arms to help lift your chest up until you feel a gentle stretch in your abdomen and your lower back. This will place some of your body weight on your elbows. If this is uncomfortable, try stacking pillows under your chest. Your hips should stay down, against the surface that you are lying on. Keep your hip and back muscles relaxed. Hold this position for __________ seconds. Slowly relax your upper body and return to the starting position. Repeat __________ times. Complete this exercise __________ times a day. Strengthening exercises These exercises build strength and endurance in your back. Endurance is the ability to use your muscles for a long time, even after they get tired. Pelvic tilt This exercise strengthens the muscles that lie deep in the abdomen. Lie on your back on a firm bed or the floor with your legs extended. Bend your knees so they are pointing toward the ceiling and your feet are flat on the floor. Tighten your lower abdominal muscles to press your lower back against the floor. This motion will tilt your pelvis so your  tailbone points up toward the ceiling instead of pointing to  your feet or the floor. To help with this exercise, you may place a small towel under your lower back and try to push your back into the towel. Hold this position for __________ seconds. Let your muscles relax completely before you repeat this exercise. Repeat __________ times. Complete this exercise __________ times a day. Alternating arm and leg raises  Get on your hands and knees on a firm surface. If you are on a hard floor, you may want to use padding, such as an exercise mat, to cushion your knees. Line up your arms and legs. Your hands should be directly below your shoulders, and your knees should be directly below your hips. Lift your left leg behind you. At the same time, raise your right arm and straighten it in front of you. Do not lift your leg higher than your hip. Do not lift your arm higher than your shoulder. Keep your abdominal and back muscles tight. Keep your hips facing the ground. Do not arch your back. Keep your balance carefully, and do not hold your breath. Hold this position for __________ seconds. Slowly return to the starting position. Repeat with your right leg and your left arm. Repeat __________ times. Complete this exercise __________ times a day. Abdominal set with straight leg raise  Lie on your back on a firm bed or the floor. Bend one of your knees and keep your other leg straight. Tense your abdominal muscles and lift your straight leg up, 4-6 inches (10-15 cm) off the ground. Keep your abdominal muscles tight and hold this position for __________ seconds. Do not hold your breath. Do not arch your back. Keep it flat against the ground. Keep your abdominal muscles tense as you slowly lower your leg back to the starting position. Repeat with your other leg. Repeat __________ times. Complete this exercise __________ times a day. Single leg lower with bent knees Lie on your back on a firm bed  or the floor. Tense your abdominal muscles and lift your feet off the floor, one foot at a time, so your knees and hips are bent in 90-degree angles (right angles). Your knees should be over your hips and your lower legs should be parallel to the floor. Keeping your abdominal muscles tense and your knee bent, slowly lower one of your legs so your toe touches the ground. Lift your leg back up to return to the starting position. Do not hold your breath. Do not let your back arch. Keep your back flat against the ground. Repeat with your other leg. Repeat __________ times. Complete this exercise __________ times a day. Posture and body mechanics Good posture and healthy body mechanics can help to relieve stress in your body's tissues and joints. Body mechanics refers to the movements and positions of your body while you do your daily activities. Posture is part of body mechanics. Good posture means: Your spine is in its natural S-curve position (neutral). Your shoulders are pulled back slightly. Your head is not tipped forward (neutral). Follow these guidelines to improve your posture and body mechanics in your everyday activities. Standing  When standing, keep your spine neutral and your feet about hip-width apart. Keep a slight bend in your knees. Your ears, shoulders, and hips should line up. When you do a task in which you stand in one place for a long time, place one foot up on a stable object that is 2-4 inches (5-10 cm) high, such as a footstool. This helps keep your spine neutral.  Sitting  When sitting, keep your spine neutral and keep your feet flat on the floor. Use a footrest, if necessary, and keep your thighs parallel to the floor. Avoid rounding your shoulders, and avoid tilting your head forward. When working at a desk or a computer, keep your desk at a height where your hands are slightly lower than your elbows. Slide your chair under your desk so you are close enough to maintain  good posture. When working at a computer, place your monitor at a height where you are looking straight ahead and you do not have to tilt your head forward or downward to look at the screen. Resting When lying down and resting, avoid positions that are most painful for you. If you have pain with activities such as sitting, bending, stooping, or squatting, lie in a position in which your body does not bend very much. For example, avoid curling up on your side with your arms and knees near your chest (fetal position). If you have pain with activities such as standing for a long time or reaching with your arms, lie with your spine in a neutral position and bend your knees slightly. Try the following positions: Lying on your side with a pillow between your knees. Lying on your back with a pillow under your knees. Lifting  When lifting objects, keep your feet at least shoulder-width apart and tighten your abdominal muscles. Bend your knees and hips and keep your spine neutral. It is important to lift using the strength of your legs, not your back. Do not lock your knees straight out. Always ask for help to lift heavy or awkward objects. This information is not intended to replace advice given to you by your health care provider. Make sure you discuss any questions you have with your health care provider. Document Revised: 10/24/2020 Document Reviewed: 10/24/2020 [employer] Patient Education  2024 [employer].

## 2023-02-05 NOTE — Assessment & Plan Note (Signed)
Chronic, stable .continue Norvasc 5 mg qd, losartan 25 mg qd,  Toprol 50 mg qd.

## 2023-02-05 NOTE — Assessment & Plan Note (Signed)
Chronic.  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  She plight declines x-ray images today.  Consider at follow-up.  Advised to continue Tylenol  650 mg every morning.  Printed exercises for low back ,hip and right knee pain (suspect this may be aggravating) for her to start.  She declines formal referral to physical therapy at this

## 2023-02-05 NOTE — Progress Notes (Signed)
Assessment & Plan:  Constipation, unspecified constipation type Assessment & Plan: Weight is stable.  Long history of constipation, recent bowel resection likely aggravated.  Discussed underlying constipation is likely the culprit ( ? Overflow diarrhea).  Encouraged starting with Metamucil to bulk stool. Consider miralax.  Referral to nutritionist for further dietary advice.  Orders: -     Amb ref to Medical Nutrition Therapy-[facility]  Rash Assessment & Plan: Presentation consistent with impetigo.  Start Bactroban.  She will let me know how she is doing  Orders: -     Mupirocin; Apply 1 Application topically 2 (two) times daily.  Dispense: 22 g; Refill: 2  Vision changes -     Ambulatory referral to Ophthalmology  OAB (overactive bladder) -     Trospium Chloride; Take 1 tablet (20 mg total) by mouth 2 (two) times daily.  Dispense: 180 tablet; Refill: 2  Primary hypertension Assessment & Plan: Chronic, stable .continue Norvasc 5 mg qd, losartan 25 mg qd,  Toprol 50 mg qd.    Low back pain without sciatica, unspecified back pain laterality, unspecified chronicity Assessment & Plan: Chronic.  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  She plight declines x-ray images today.  Consider at follow-up.  Advised to continue Tylenol  650 mg every morning.  Printed exercises for low back ,hip and right knee pain (suspect this may be aggravating) for her to start.  She declines formal referral to physical therapy at this      Return precautions given.   Risks, benefits, and alternatives of the medications and treatment plan prescribed today were discussed, and patient expressed understanding.   Education regarding symptom management and diagnosis given to patient on AVS either electronically or printed.  Return in about 2 months (around 04/07/2023).  Donna Plowman, FNP  Subjective:    Patient ID: Donna Ferguson, female    DOB: Sep 14, 1940, 82 y.o.   MRN: [id number]  CC: Donna  Ferguson is a 82 y.o. female who presents today for follow up.   HPI: Complains of 'small spots' on right side of face, hairline and right side of lip.  She noticed these yesterday  Crusty yellow.  A little itchy. Non tender. No fever.   Complains of chronic right posterior, lateral low back hip pain.  No injury. Endorses stiffness.   She is taking tylenol 650mg  qam with relief.   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  She takes Imodium as needed without relief. Aggravated by lactose    Allergies: Patient has no known allergies. Current Outpatient Medications on File Prior to Visit  Medication Sig Dispense Refill   amLODipine (NORVASC) 5 MG tablet TAKE 1 TABLET(5 MG) BY MOUTH EVERY EVENING 90 tablet 3   cholecalciferol (VITAMIN D3) 25 MCG (1000 UNIT) tablet Take 1,000 Units by mouth daily.     ibuprofen (ADVIL) 600 MG tablet Take 1 tablet (600 mg total) by mouth every 6 (six) hours as needed. 30 tablet 0   losartan (COZAAR) 25 MG tablet TAKE 1 TABLET(25 MG) BY MOUTH DAILY 90 tablet 3   metoprolol succinate (TOPROL-XL) 50 MG 24 hr tablet TAKE 1 TABLET(50 MG) BY MOUTH DAILY WITH OR IMMEDIATELY FOLLOWING A MEAL 90 tablet 3   vitamin C (ASCORBIC ACID) 500 MG tablet Take  500 mg by mouth daily.     No current facility-administered medications on file prior to visit.    Review of Systems  Constitutional:  Negative for chills and fever.  Respiratory:  Negative for cough.   Cardiovascular:  Negative for chest pain and palpitations.  Gastrointestinal:  Negative for nausea and vomiting.  Musculoskeletal:  Positive for back pain.  Skin:  Positive for rash.  Neurological:  Negative for numbness.      Objective:    BP 122/82   Pulse  72   Temp 98 F (36.7 C) (Oral)   Ht 5\' 3"  (1.6 m)   Wt 151 lb 9.6 oz (68.8 kg)   SpO2 99%   BMI 26.85 kg/m  BP Readings from Last 3 Encounters:  02/05/23 122/82  01/22/23 122/63  12/24/22 137/79   Wt Readings from Last 3 Encounters:  02/05/23 151 lb 9.6 oz (68.8 kg)  01/22/23 149 lb 8 oz (67.8 kg)  12/24/22 149 lb 9.6 oz (67.9 kg)    Physical Exam Vitals reviewed.  Constitutional:      Appearance: She is well-developed.  Eyes:     Conjunctiva/sclera: Conjunctivae normal.  Cardiovascular:     Rate and Rhythm: Normal rate and regular rhythm.     Pulses: Normal pulses.     Heart sounds: Normal heart sounds.  Pulmonary:     Effort: Pulmonary effort is normal.     Breath sounds: Normal breath sounds. No wheezing, rhonchi or rales.  Musculoskeletal:     Lumbar back: No swelling, edema, spasms, tenderness or bony tenderness. Normal range of motion.       Back:     Comments: Full range of motion with flexion, tension, lateral side bends. No bony tenderness. No pain, numbness, tingling elicited with single leg raise bilaterally.  Bilateral  Hip: No limp or waddling gait. Full ROM with flexion and hip rotation in flexion.    No pain of lateral hip with  (flexion-abduction-external rotation) test.   No pain with deep palpation of greater trochanter.     Skin:    General: Skin is warm and dry.          Comments: scattered group crusty papular lesions hairline, right cheek.  No purulent discharge, vesicular lesions  Neurological:     Mental Status: She is alert.     Sensory: No sensory deficit.     Deep Tendon Reflexes:     Reflex Scores:      Patellar reflexes are 2+ on the right side and 2+ on the left side.    Comments: Sensation and strength intact bilateral lower extremities.  Psychiatric:        Speech: Speech normal.        Behavior: Behavior normal.        Thought Content: Thought content normal.

## 2023-02-05 NOTE — Assessment & Plan Note (Signed)
Presentation consistent with impetigo.  Start Bactroban.  She will let me know how she is doing

## 2023-02-05 NOTE — Assessment & Plan Note (Addendum)
Weight is stable.  Long history of constipation, recent bowel resection likely aggravated.  Discussed underlying constipation is likely the culprit ( ? Overflow diarrhea).  Encouraged starting with Metamucil to bulk stool. Consider miralax.  Referral to nutritionist for further dietary advice.

## 2023-02-12 ENCOUNTER — Inpatient Hospital Stay: Payer: Medicare Other

## 2023-02-12 DIAGNOSIS — C7A8 Other malignant neuroendocrine tumors: Secondary | ICD-10-CM

## 2023-02-14 LAB — CHROMOGRANIN A: Chromogranin A (ng/mL): 134 ng/mL — ABNORMAL HIGH (ref 0.0–101.8)

## 2023-02-16 LAB — 5 HIAA, QUANTITATIVE, URINE, 24 HOUR
5-HIAA, Ur: 1.9 mg/L
5-HIAA,Quant.,24 Hr Urine: 2.9 mg/24 hr (ref 0.0–14.9)
Total Volume: 1550

## 2023-02-17 ENCOUNTER — Other Ambulatory Visit: Payer: Self-pay | Admitting: Family

## 2023-02-17 DIAGNOSIS — N3281 Overactive bladder: Secondary | ICD-10-CM

## 2023-03-20 ENCOUNTER — Encounter (INDEPENDENT_AMBULATORY_CARE_PROVIDER_SITE_OTHER): Payer: Self-pay

## 2023-04-05 ENCOUNTER — Encounter: Payer: Self-pay | Admitting: Family

## 2023-04-05 ENCOUNTER — Ambulatory Visit (INDEPENDENT_AMBULATORY_CARE_PROVIDER_SITE_OTHER): Payer: Medicare Other | Admitting: Family

## 2023-04-05 VITALS — BP 110/75 | HR 70 | Temp 98.5°F | Ht 63.0 in | Wt 150.2 lb

## 2023-04-05 DIAGNOSIS — R7303 Prediabetes: Secondary | ICD-10-CM

## 2023-04-05 DIAGNOSIS — M545 Low back pain, unspecified: Secondary | ICD-10-CM

## 2023-04-05 DIAGNOSIS — I10 Essential (primary) hypertension: Secondary | ICD-10-CM

## 2023-04-05 DIAGNOSIS — K5900 Constipation, unspecified: Secondary | ICD-10-CM

## 2023-04-05 DIAGNOSIS — Z136 Encounter for screening for cardiovascular disorders: Secondary | ICD-10-CM

## 2023-04-05 NOTE — Assessment & Plan Note (Signed)
Chronic, stable .continue Norvasc 5 mg qd, losartan 25 mg qd,  Toprol 50 mg qd.

## 2023-04-05 NOTE — Progress Notes (Signed)
Assessment & Plan:  Prediabetes -     Lipid panel; Future -     Hemoglobin A1c; Future  Encounter for lipid screening for cardiovascular disease -     Lipid panel; Future  Primary hypertension Assessment & Plan: Chronic, stable .continue Norvasc 5 mg qd, losartan 25 mg qd,  Toprol 50 mg qd.    Constipation, unspecified constipation type Assessment & Plan: Resolved with increase the fiber.  Will monitor   Low back pain without sciatica, unspecified back pain laterality, unspecified chronicity Assessment & Plan: Not bothersome at this time.  She declines further evaluation. will continue to follow      Return precautions given.   Risks, benefits, and alternatives of the medications and treatment plan prescribed today were discussed, and patient expressed understanding.   Education regarding symptom management and diagnosis given to patient on AVS either electronically or printed.  Return for Fasting labs in 2-3 weeks.  [name], FNP  Subjective:    Patient ID: Donna Ferguson, Donna Ferguson    DOB: Jul 09, 1941, 82 y.o.   MRN: [id number]  CC: Donna Ferguson is a 82 y.o. Donna Ferguson who presents today for follow up.   HPI: Follow-up constipation, low back pain  Exercises provided for back pain. Back pain has resolved. She is taking tylenol 650mg  every day and back pain is not bothersome.  She doesn't feel she needs PT or images at this time.      She increased fiber and constipation has resolved.   She takes Metamucil and MiraLAX prn with relief.   Previous rash has resolved.   She is compliant with amlodipine, metoprolol, and losartan.  Denies chest pain, shortness of breath  Allergies: Patient has no known allergies. Current Outpatient Medications on File Prior to Visit  Medication Sig Dispense Refill   amLODipine (NORVASC) 5 MG tablet TAKE 1 TABLET(5 MG) BY MOUTH EVERY EVENING 90 tablet 3   cholecalciferol (VITAMIN D3) 25 MCG (1000 UNIT) tablet Take 1,000 Units  by mouth daily.     losartan (COZAAR) 25 MG tablet TAKE 1 TABLET(25 MG) BY MOUTH DAILY 90 tablet 3   metoprolol succinate (TOPROL-XL) 50 MG 24 hr tablet TAKE 1 TABLET(50 MG) BY MOUTH DAILY WITH OR IMMEDIATELY FOLLOWING A MEAL 90 tablet 3   mupirocin ointment (BACTROBAN) 2 % Apply 1 Application topically 2 (two) times daily. 22 g 2   ibuprofen (ADVIL) 600 MG tablet Take 1 tablet (600 mg total) by mouth every 6 (six) hours as needed. 30 tablet 0   trospium (SANCTURA) 20 MG tablet Take 1 tablet (20 mg total) by mouth 2 (two) times daily. 180 tablet 2   vitamin C (ASCORBIC ACID) 500 MG tablet Take 500 mg by mouth daily.     No current facility-administered medications on file prior to visit.    Review of Systems  Constitutional:  Negative for chills and fever.  Respiratory:  Negative for cough.   Cardiovascular:  Negative for chest pain and palpitations.  Gastrointestinal:  Negative for nausea and vomiting.      Objective:    BP 110/75   Pulse 70   Temp 98.5 F (36.9 C)   Ht 5\' 3"  (1.6 m)   Wt 150 lb 3.2 oz (68.1 kg)   SpO2 97%   BMI 26.61 kg/m  BP Readings from Last 3 Encounters:  04/05/23 110/75  02/05/23 122/82  01/22/23 122/63   Wt Readings from Last 3 Encounters:  04/05/23 150 lb 3.2 oz (68.1 kg)  02/05/23  151 lb 9.6 oz (68.8 kg)  01/22/23 149 lb 8 oz (67.8 kg)    Physical Exam Vitals reviewed.  Constitutional:      Appearance: She is well-developed.  Eyes:     Conjunctiva/sclera: Conjunctivae normal.  Cardiovascular:     Rate and Rhythm: Normal rate and regular rhythm.     Pulses: Normal pulses.     Heart sounds: Normal heart sounds.  Pulmonary:     Effort: Pulmonary effort is normal.     Breath sounds: Normal breath sounds. No wheezing, rhonchi or rales.  Skin:    General: Skin is warm and dry.  Neurological:     Mental Status: She is alert.  Psychiatric:        Speech: Speech normal.        Behavior: Behavior normal.        Thought Content: Thought  content normal.

## 2023-04-05 NOTE — Assessment & Plan Note (Signed)
Not bothersome at this time.  She declines further evaluation. will continue to follow

## 2023-04-05 NOTE — Assessment & Plan Note (Signed)
Resolved with increase the fiber.  Will monitor

## 2023-04-12 ENCOUNTER — Ambulatory Visit: Payer: Medicare Other | Admitting: Family

## 2023-04-15 ENCOUNTER — Ambulatory Visit: Payer: Medicare Other | Admitting: Family

## 2023-04-19 ENCOUNTER — Other Ambulatory Visit (INDEPENDENT_AMBULATORY_CARE_PROVIDER_SITE_OTHER): Payer: Medicare Other

## 2023-04-19 DIAGNOSIS — Z13220 Encounter for screening for lipoid disorders: Secondary | ICD-10-CM | POA: Diagnosis not present

## 2023-04-19 DIAGNOSIS — Z136 Encounter for screening for cardiovascular disorders: Secondary | ICD-10-CM

## 2023-04-19 DIAGNOSIS — R7303 Prediabetes: Secondary | ICD-10-CM

## 2023-04-19 LAB — HEMOGLOBIN A1C: Hgb A1c MFr Bld: 6.1 % (ref 4.6–6.5)

## 2023-04-19 LAB — LIPID PANEL
Cholesterol: 126 mg/dL (ref 0–200)
HDL: 52.6 mg/dL (ref >39.00)
LDL Cholesterol: 43 mg/dL (ref 0–99)
NonHDL: 73.57
Total CHOL/HDL Ratio: 2
Triglycerides: 152 mg/dL — ABNORMAL HIGH (ref 0.0–149.0)
VLDL: 30.4 mg/dL (ref 0.0–40.0)

## 2023-04-19 IMAGING — DX DG KNEE COMPLETE 4+V*L*
5 series · 5 of 5 positions shown · non-contrast
Comparison: None.

CLINICAL DATA: Knee pain

EXAM:
LEFT KNEE - COMPLETE 4+ VIEW

[knee standing ap]
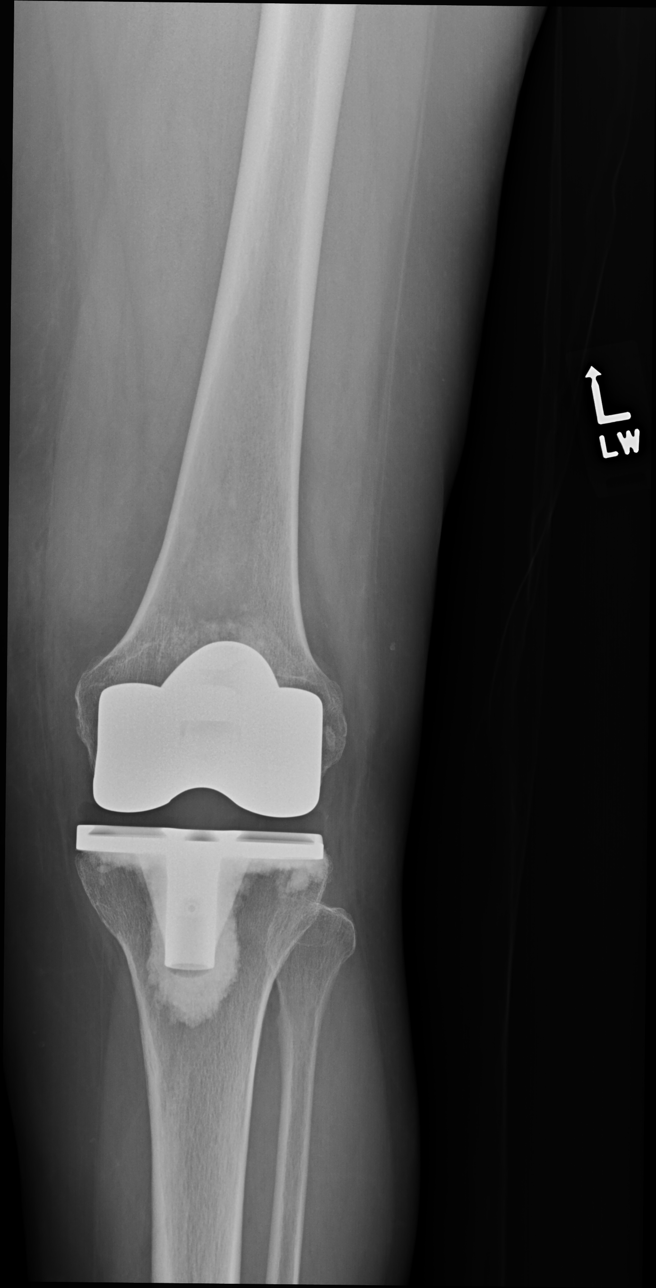

[knee standing external ap]
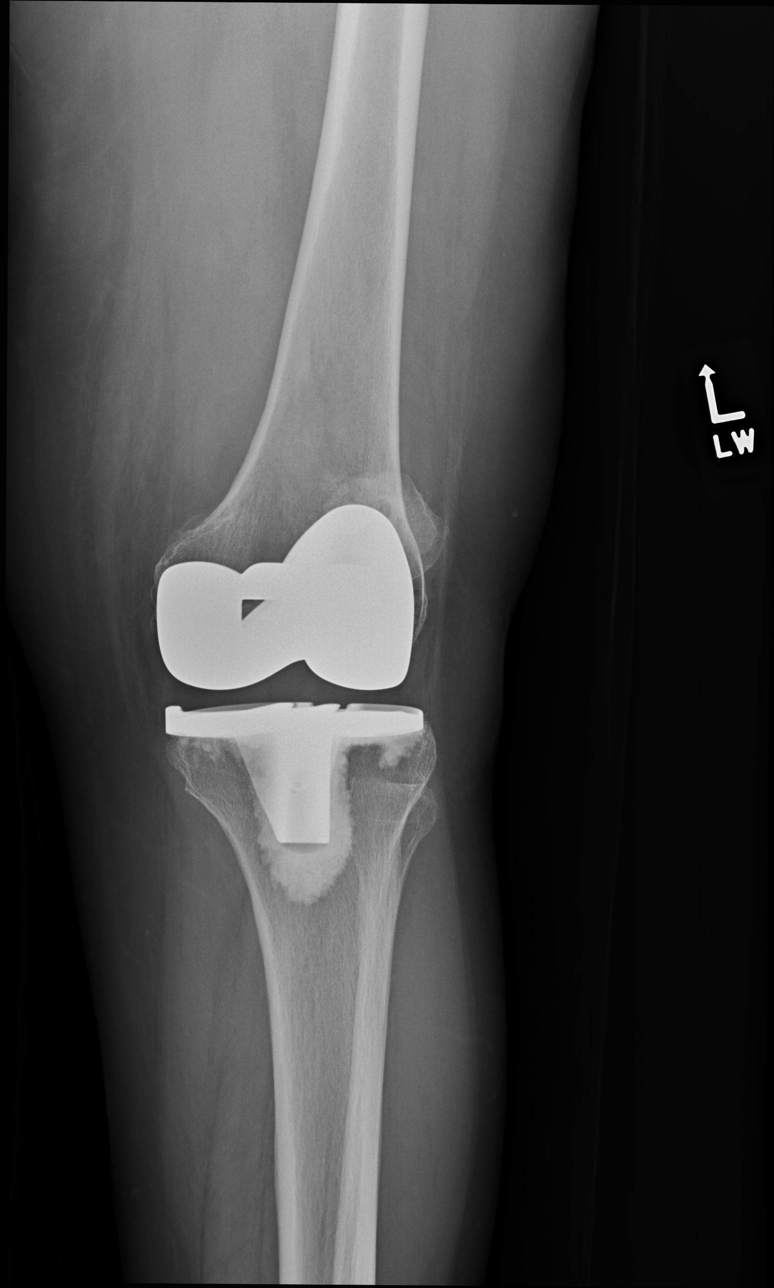

[knee standing internal ap]
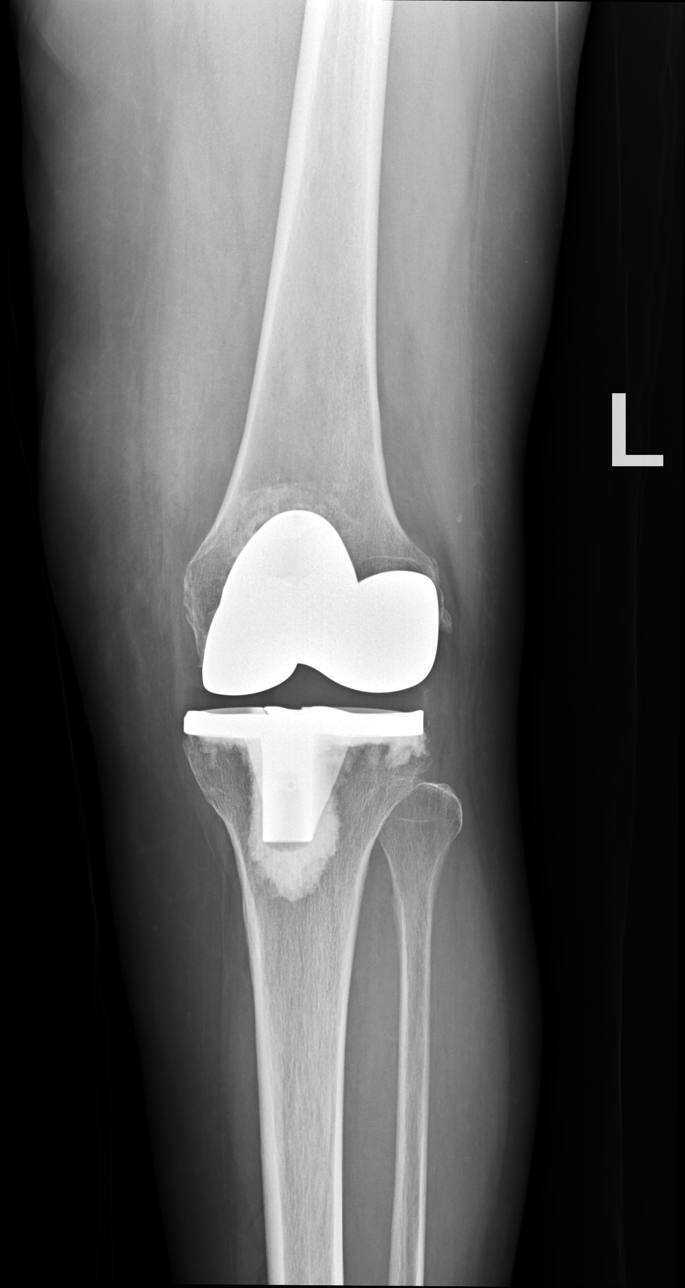

[knee standing lat]
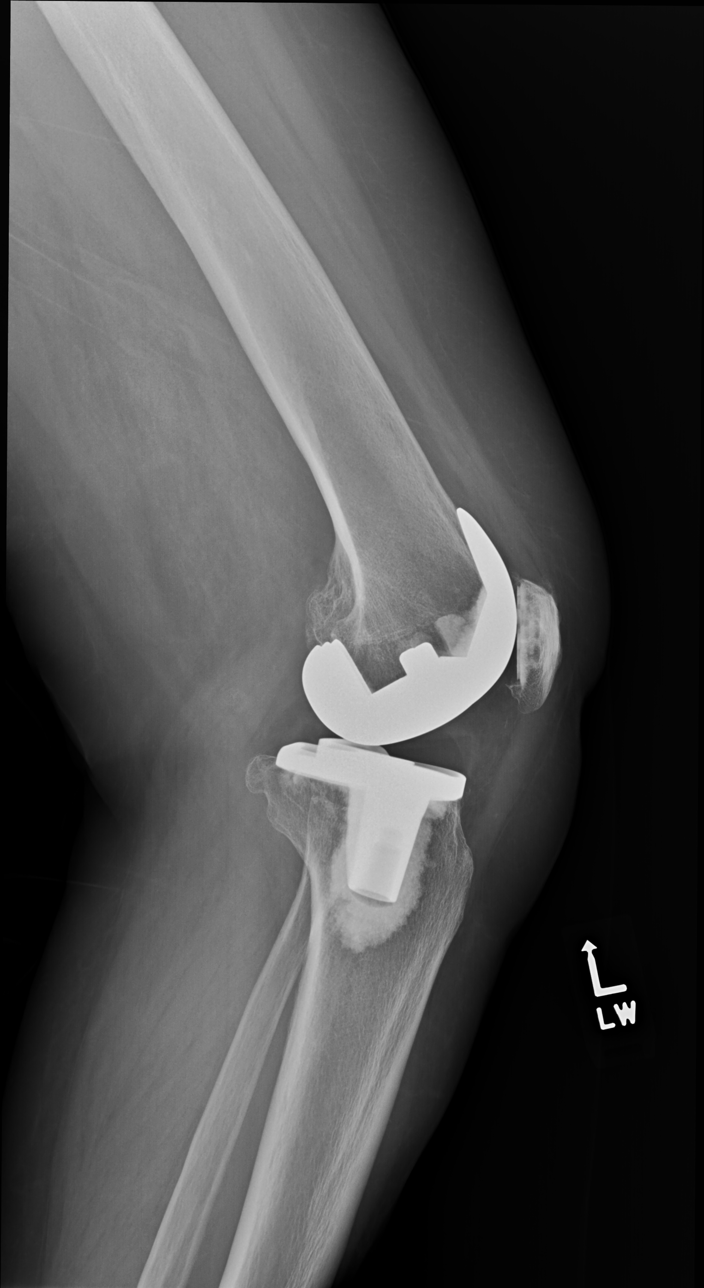

[knee [person_name] view pa]
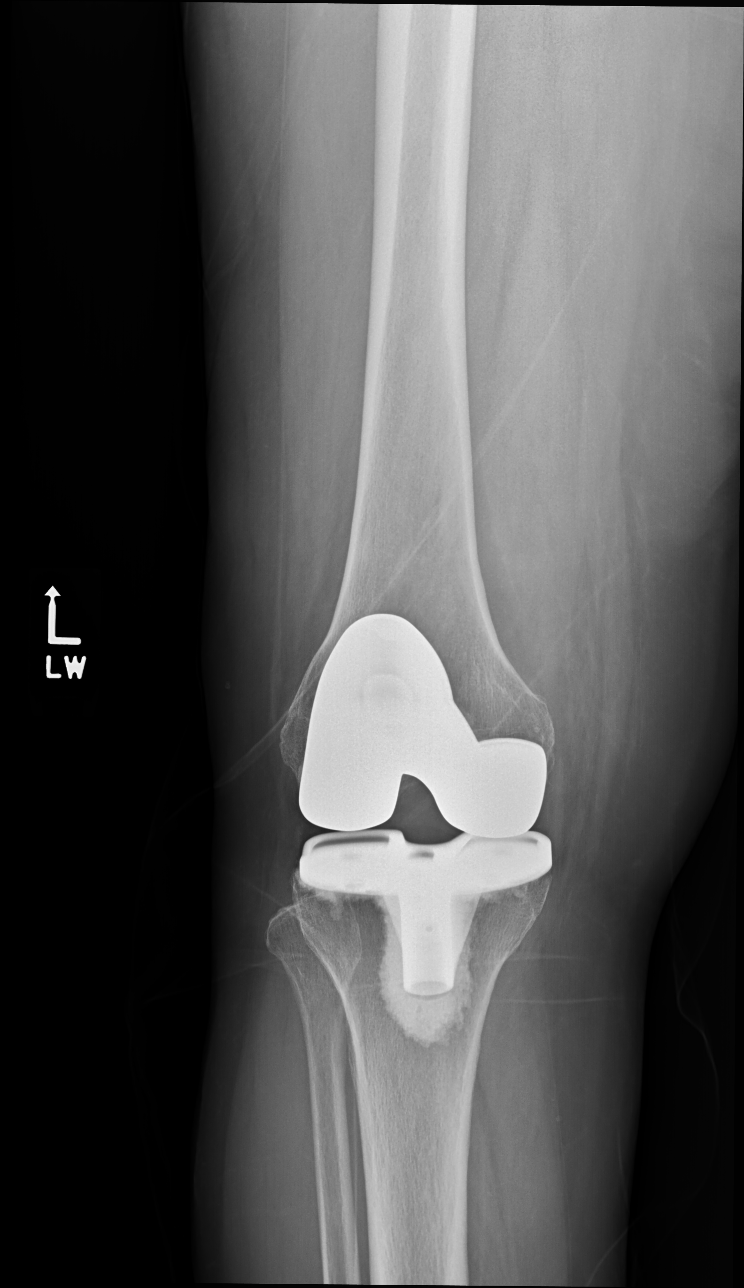

[5 of 5 positions shown; findings below may reference images not displayed]

FINDINGS: Prior 3 component total knee arthroplasty without evidence of
complication.
IMPRESSION: Prior 3 component total knee arthroplasty without evidence of
complication.

## 2023-04-19 IMAGING — DX DG KNEE COMPLETE 4+V*R*
5 series · 5 of 5 positions shown · non-contrast
Comparison: None.

CLINICAL DATA: Bilateral knee pain

EXAM:
RIGHT KNEE - COMPLETE 4+ VIEW

[knee standing ap]
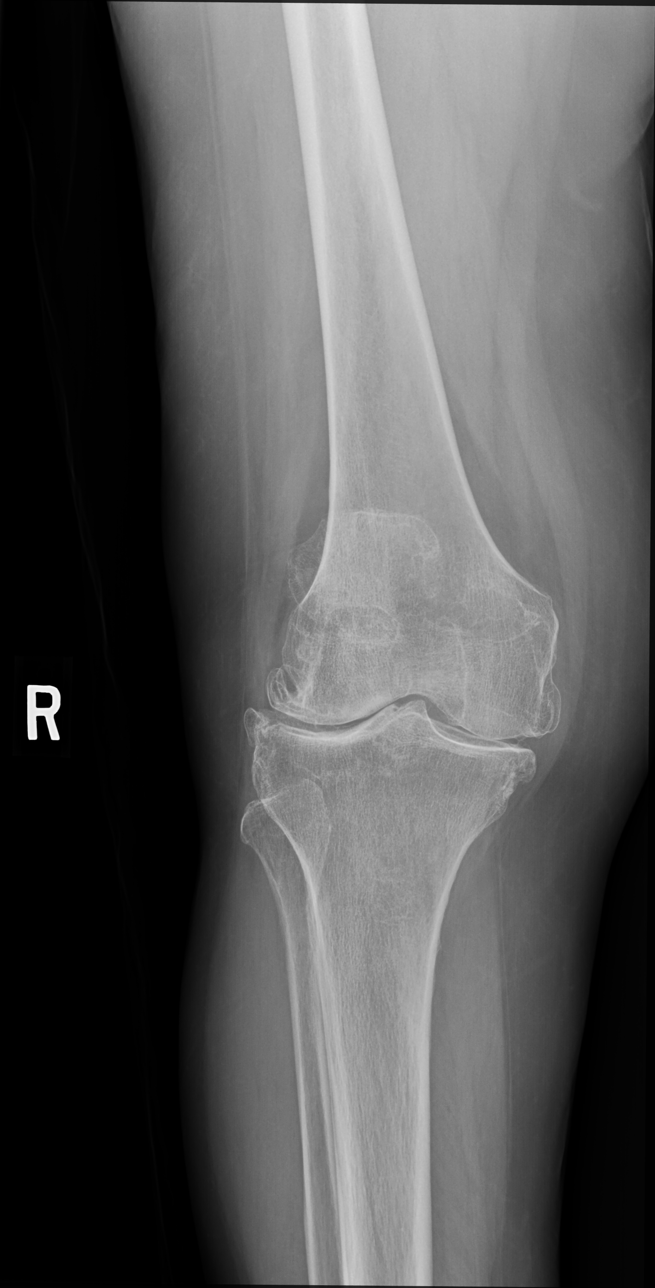

[knee standing external ap]
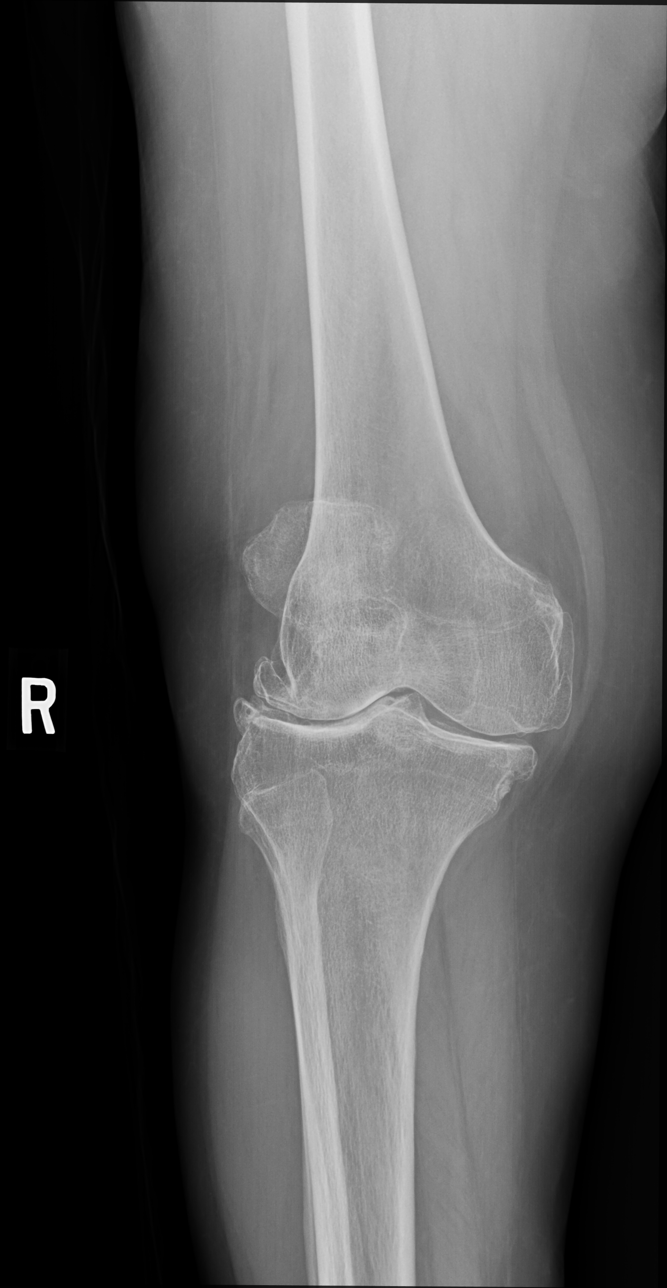

[knee standing internal ap]
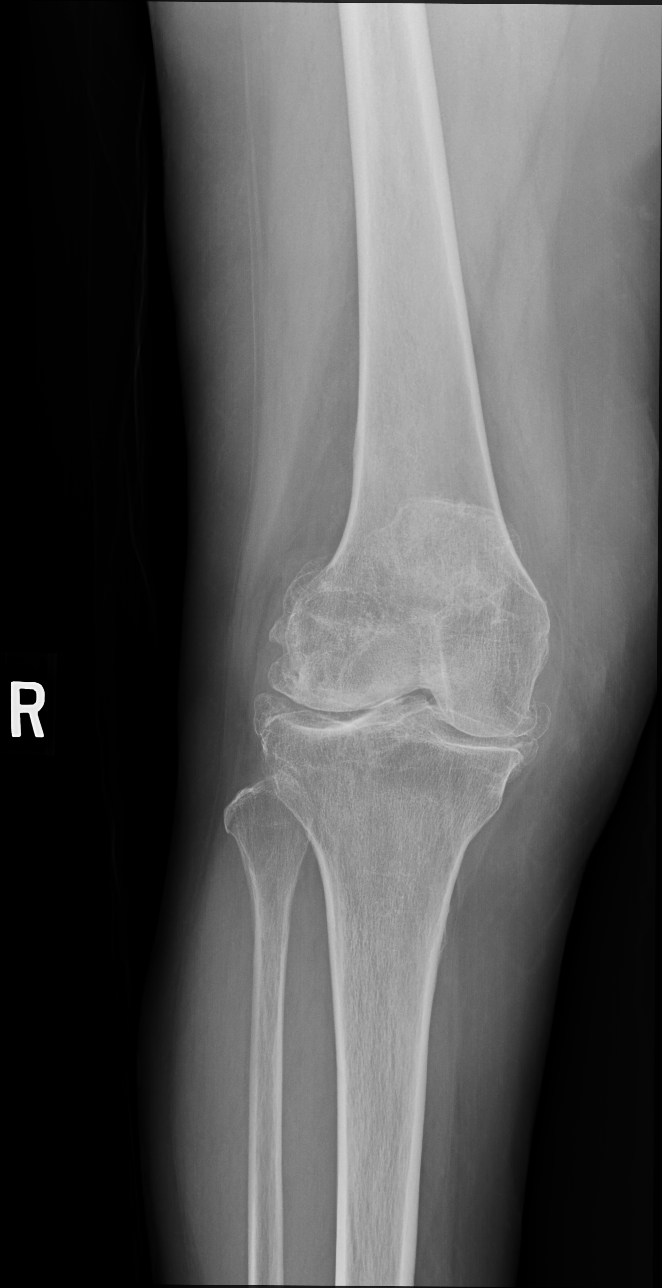

[knee standing lat]
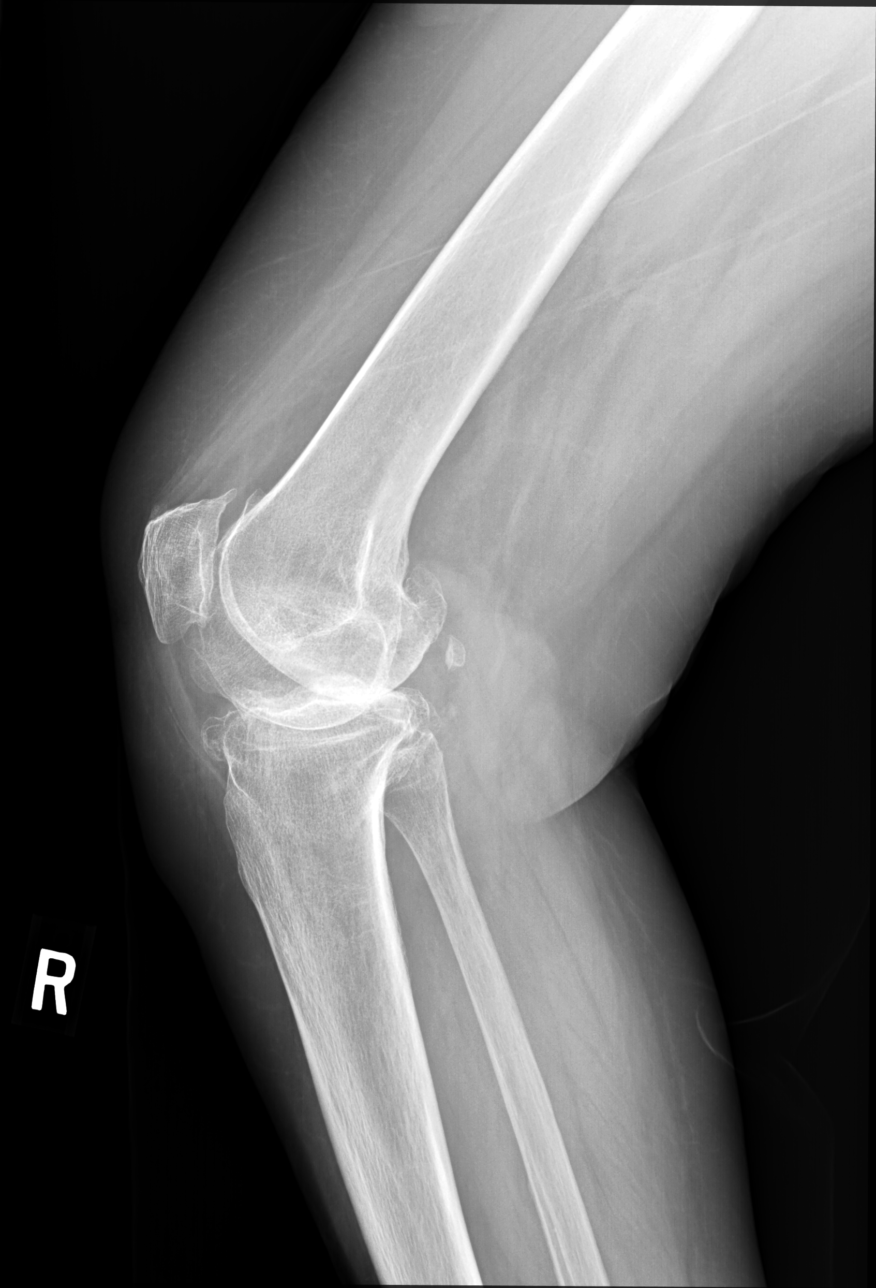

[knee [person_name] view pa]
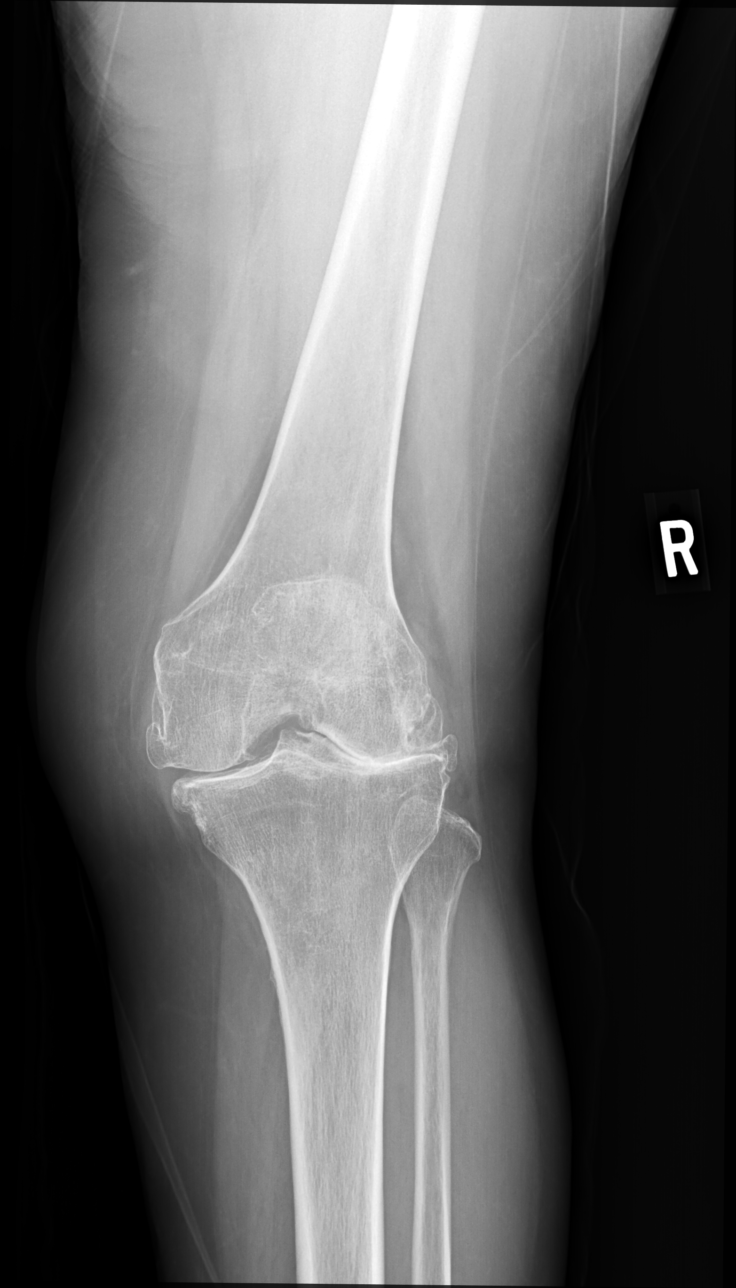

[5 of 5 positions shown; findings below may reference images not displayed]

FINDINGS: No evidence of fracture or dislocation. Tiny joint effusion. Severe
tricompartment degenerative change.
IMPRESSION: Severe tricompartment degenerative change. Tiny joint effusion. No
acute bony abnormality.

## 2023-04-24 ENCOUNTER — Other Ambulatory Visit: Payer: Medicare Other

## 2023-04-24 ENCOUNTER — Ambulatory Visit: Payer: Medicare Other | Admitting: Dietician

## 2023-07-23 ENCOUNTER — Inpatient Hospital Stay: Payer: Medicare Other | Attending: Oncology

## 2023-07-23 DIAGNOSIS — C7A019 Malignant carcinoid tumor of the small intestine, unspecified portion: Secondary | ICD-10-CM | POA: Insufficient documentation

## 2023-07-23 DIAGNOSIS — C7A8 Other malignant neuroendocrine tumors: Secondary | ICD-10-CM

## 2023-07-23 LAB — CBC WITH DIFFERENTIAL (CANCER CENTER ONLY)
Abs Immature Granulocytes: 0.02 10*3/uL (ref 0.00–0.07)
Basophils Absolute: 0.1 10*3/uL (ref 0.0–0.1)
Basophils Relative: 1 %
Eosinophils Absolute: 0.1 10*3/uL (ref 0.0–0.5)
Eosinophils Relative: 1 %
HCT: 40 % (ref 36.0–46.0)
Hemoglobin: 12.7 g/dL (ref 12.0–15.0)
Immature Granulocytes: 0 %
Lymphocytes Relative: 43 %
Lymphs Abs: 3.7 10*3/uL (ref 0.7–4.0)
MCH: 28.9 pg (ref 26.0–34.0)
MCHC: 31.8 g/dL (ref 30.0–36.0)
MCV: 91.1 fL (ref 80.0–100.0)
Monocytes Absolute: 0.7 10*3/uL (ref 0.1–1.0)
Monocytes Relative: 8 %
Neutro Abs: 4.1 10*3/uL (ref 1.7–7.7)
Neutrophils Relative %: 47 %
Platelet Count: 274 10*3/uL (ref 150–400)
RBC: 4.39 MIL/uL (ref 3.87–5.11)
RDW: 13.1 % (ref 11.5–15.5)
WBC Count: 8.6 10*3/uL (ref 4.0–10.5)
nRBC: 0 % (ref 0.0–0.2)

## 2023-07-23 LAB — CMP (CANCER CENTER ONLY)
ALT: 14 U/L (ref 0–44)
AST: 18 U/L (ref 15–41)
Albumin: 3.8 g/dL (ref 3.5–5.0)
Alkaline Phosphatase: 115 U/L (ref 38–126)
Anion gap: 9 (ref 5–15)
BUN: 26 mg/dL — ABNORMAL HIGH (ref 8–23)
CO2: 24 mmol/L (ref 22–32)
Calcium: 9.1 mg/dL (ref 8.9–10.3)
Chloride: 103 mmol/L (ref 98–111)
Creatinine: 1.12 mg/dL — ABNORMAL HIGH (ref 0.44–1.00)
GFR, Estimated: 50 mL/min — ABNORMAL LOW (ref >60)
Glucose, Bld: 97 mg/dL (ref 70–99)
Potassium: 4.1 mmol/L (ref 3.5–5.1)
Sodium: 136 mmol/L (ref 135–145)
Total Bilirubin: 0.5 mg/dL (ref <1.2)
Total Protein: 6.8 g/dL (ref 6.5–8.1)

## 2023-07-24 ENCOUNTER — Ambulatory Visit: Payer: BLUE CROSS/BLUE SHIELD

## 2023-07-25 ENCOUNTER — Ambulatory Visit: Admission: RE | Admit: 2023-07-25 | Payer: Medicare Other | Source: Ambulatory Visit

## 2023-07-25 LAB — CHROMOGRANIN A: Chromogranin A (ng/mL): 136 ng/mL — ABNORMAL HIGH (ref 0.0–101.8)

## 2023-07-29 ENCOUNTER — Telehealth: Payer: Self-pay | Admitting: Oncology

## 2023-07-29 ENCOUNTER — Other Ambulatory Visit: Payer: Medicare Other

## 2023-07-29 NOTE — Telephone Encounter (Signed)
Pt was scheduled to see MD 12/10 to go over PET results, but the PET is scheduled for 12/11. I have r/s the MD appt for Jan 2. And left vm for pt.

## 2023-07-30 ENCOUNTER — Inpatient Hospital Stay: Payer: Medicare Other | Admitting: Oncology

## 2023-07-31 ENCOUNTER — Ambulatory Visit
Admission: RE | Admit: 2023-07-31 | Discharge: 2023-07-31 | Disposition: A | Discharge status: Home / Self Care | Payer: Medicare Other | Source: Ambulatory Visit | Attending: Oncology | Admitting: Oncology

## 2023-07-31 ENCOUNTER — Telehealth: Payer: Self-pay | Admitting: Family

## 2023-07-31 DIAGNOSIS — C7A8 Other malignant neuroendocrine tumors: Secondary | ICD-10-CM | POA: Diagnosis present

## 2023-07-31 MED ORDER — COPPER CU 64 DOTATATE 1 MCI/ML IV SOLN
4.0000 | Freq: Once | INTRAVENOUS | Status: AC
  Administered 2023-07-31: 4.29 via INTRAVENOUS

## 2023-07-31 NOTE — Telephone Encounter (Signed)
Copied from CRM [id number]. Topic: Medicare AWV >> Jul 31, 2023  1:44 PM Payton Doughty wrote: Reason for [id number]:[id number] LVM 07/31/2023 to schedule Annual Wellness Visit  Verlee Rossetti; Care Guide Ambulatory Clinical Support  l [facility] Direct Dial: [phone]

## 2023-08-22 ENCOUNTER — Inpatient Hospital Stay: Payer: Medicare Other | Attending: Oncology | Admitting: Oncology

## 2023-08-22 ENCOUNTER — Encounter: Payer: Self-pay | Admitting: Oncology

## 2023-08-22 VITALS — BP 155/81 | HR 74 | Temp 96.2°F | Resp 18 | Wt 156.0 lb

## 2023-08-22 DIAGNOSIS — C7A019 Malignant carcinoid tumor of the small intestine, unspecified portion: Secondary | ICD-10-CM | POA: Insufficient documentation

## 2023-08-22 DIAGNOSIS — Z800 Family history of malignant neoplasm of digestive organs: Secondary | ICD-10-CM | POA: Diagnosis not present

## 2023-08-22 DIAGNOSIS — C7A8 Other malignant neuroendocrine tumors: Secondary | ICD-10-CM | POA: Diagnosis not present

## 2023-08-22 NOTE — Assessment & Plan Note (Addendum)
 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  Repeat chromogranin a level is chronically elevated. Discussed with the patient that chromogranin A can be affected by her acid reflux medication, food, chronic inflammation i.e. arthritis.  Negative Dotatate PET scan.  Plan to repeat dotatate in 12 months

## 2023-08-22 NOTE — Progress Notes (Signed)
 Hematology/Oncology Progress note [phone] [phone]     CHIEF COMPLAINTS/REASON FOR VISIT:  Small bowel neuroendocrine carcinoma.  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  Repeat chromogranin a level is chronically elevated. Discussed with the patient that chromogranin A can be affected by her acid reflux medication, food, chronic inflammation i.e. arthritis.  Negative Dotatate PET scan.  Plan to repeat dotatate in 12 months    Orders Placed This Encounter  Procedures   NM PET DOTATATE SKULL BASE TO MID THIGH    Standing Status:   Future    Expected Date:   08/21/2024    Expiration Date:   08/21/2024    If indicated for the ordered procedure, I authorize the administration of a radiopharmaceutical per Radiology protocol:   Yes    Preferred imaging location?:   [facility]   Follow-up in 12 months. All questions were answered. The patient knows to call the clinic with any problems, questions or concerns.  Zelphia Cap, MD, PhD [facility] Hematology Oncology 08/22/2023     HISTORY OF PRESENTING ILLNESS:   Oncology History  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HCC)  11/17/2021 Imaging   CT abdomen pelvis without contrast was obtained for further evaluation.  CT showed no acute abnormality of the abdomen or pelvis.  Scattered sigmoid colonic diverticulosis without findings of acute diverticulitis.  Nonspecific prominent/mildly enlarged lymph nodes along the mesenteric root measuring up to 13 mm in short axis previously measuring up to 9 mm.  Constipation.     05/01/2022 Imaging   CT abdomen pelvis without contrast 1. New circumferential wall thickening of the right colon suspicious for malignancy. 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 2. Possible underlying polyposis  syndrome with suggested numerous filling defects within the small bowel in the pelvis. 3. No evidence of bowel obstruction or perforation. 4. No definite signs of extranodal metastatic disease on noncontrast imaging. 5. Further evaluation with colonoscopy and/or PET-CT recommended.   05/31/2022 Procedure   Colonoscopy showed multiple polyps resected. Tubular adenoma. No malignancy    06/18/2022 Imaging   PET scan showed 1. No abnormal radiotracer uptake identified within the enlarged mesenteric lymph nodes. Etiology of the enlarged lymph nodes is indeterminate. If there is biochemical concern for indolent neoplastic process such as carcinoid tumor consider further evaluation with DOTATATE PET-CT. 2. No abnormal radiotracer uptake identified within the small or large bowel loops. 3. Right renal cyst. 4. Coronary artery calcifications. 5.  Aortic Atherosclerosis   07/11/2022 Tumor Marker   Chromogranin A level  191.5 24-hour 5 HIAA level was ordered, patient did not submit urine sample for analysis prior to the surgery   08/02/2022 Imaging   Dotatate PET scan showed 1. Multiple intensely radiotracer avid small bowel lesions which are essentially occult by noncontrast CT imaging. Favor multifocal small bowel neuroendocrine tumor (well differentiated). No evidence of bowel obstruction. 2. Several central mesenteric intensely radiotracer avid well differentiated neuroendocrine tumor metastasis. 3. No liver metastasis or distant metastatic disease. 4. No neuroendocrine tumor identified within the colon   10/02/2022 Initial Diagnosis   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HCC)   10/02/2022 Cancer Staging   Staging form: Small Intestine - Other Histologies, AJCC 8th Edition - Pathologic stage from 10/02/2022: pT4, pN2, cM0 - Signed by Cap Zelphia, MD on 10/16/2022 Stage prefix: Initial diagnosis Total positive nodes: 33 Total nodes examined: 59 Histologic grade (  G): G2 Histologic grading  system: 4 grade system   10/02/2022 Surgery   Small bowel resection showed well-differentiated neuroendocrine tumor, WHO grade 2, multifocal 33 of 59 lymph nodes involved by metastatic carcinoma, measuring up to 7 mm in greatest extent.  With extranodal extension Surgical margins are negative for malignancy. pT4 pN2  Appendix-benign vermiform appendix with no significant histopathologic changes.  Negative for dysplasia and malignancy.     INTERVAL HISTORY Donna Ferguson is a 83 y.o. female who has above history reviewed by me today presents for follow up visit for neuroendocrine carcinoma of small intestine. She reports feeling well.  oday she denies any abdominal pain.  Bowel habits are different.   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  She uses Imodium as needed with relief of symptoms.    Review of Systems  Constitutional:  Negative for appetite change, chills, fatigue and fever.  HENT:   Negative for hearing loss and voice change.   Eyes:  Negative for eye problems.  Respiratory:  Negative for chest tightness and cough.   Cardiovascular:  Negative for chest pain.  Gastrointestinal:  Positive for diarrhea. Negative for abdominal distention, abdominal pain and blood in stool.  Endocrine: Negative for hot flashes.  Genitourinary:  Negative for difficulty urinating and frequency.   Musculoskeletal:  Negative for arthralgias.  Skin:  Negative for itching and rash.  Neurological:  Negative for extremity weakness.  Hematological:  Negative for adenopathy.  Psychiatric/Behavioral:  Negative for confusion.     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   Diverticulosis    Dyspnea    Heart murmur     History of colonoscopy    2019 in [place]; patient states no longer screening for colon cancer.   Hypertension    IBS (irritable bowel syndrome)    Lower back pain    Pneumonia    PONV (postoperative nausea and vomiting)    Pre-diabetes    Renal cyst    Right leg DVT (HCC)     SURGICAL HISTORY: Past Surgical History:  Procedure Laterality Date   ABDOMINAL HYSTERECTOMY     unsure if has ovaries.    BOWEL RESECTION N/A 10/02/2022   Procedure: SMALL BOWEL RESECTION, open, possible colectomy, RNFA to assist;  Surgeon: Jordis Laneta FALCON, MD;  Location: [facility] ORS;  Service: General;  Laterality: N/A;   BREAST CYST EXCISION Bilateral    BREAST EXCISIONAL BIOPSY Bilateral late 80s, early 90s    benign   BREAST SURGERY     COLONOSCOPY     COLONOSCOPY WITH PROPOFOL  N/A 05/31/2022   Procedure: COLONOSCOPY WITH PROPOFOL ;  Surgeon: Unk Corinn Skiff, MD;  Location: [facility] ENDOSCOPY;  Service: Gastroenterology;  Laterality: N/A;   JOINT REPLACEMENT     REPLACEMENT TOTAL KNEE Left 2017   TOTAL KNEE ARTHROPLASTY Right 12/28/2021   Procedure: TOTAL KNEE ARTHROPLASTY;  Surgeon: Edie Norleen PARAS, MD;  Location: [facility] ORS;  Service: Orthopedics;  Laterality: Right;    SOCIAL HISTORY: Social History   Socioeconomic History   Marital status: Married    Spouse name: Roosevelt   Number of children: 2   Years of education: Not on file   Highest education level: Not on file  Occupational  History   Not on file  Tobacco Use   Smoking status: Never    Passive exposure: Past   Smokeless tobacco: Never  Vaping Use   Vaping status: Never Used  Substance and Sexual Activity   Alcohol use: Never   Drug use: Never   Sexual activity: Yes    Birth control/protection: Surgical  Other Topics Concern   Not on file  Social History Narrative   [employer] 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 by way of [place]   Retired 2011.    Enjoys  painting   Social Drivers of Corporate Investment Banker Strain: Low Risk  (08/06/2022)   Overall Financial Resource Strain (CARDIA)    Difficulty of Paying Living Expenses: Not hard at all  Food Insecurity: No Food Insecurity (10/05/2022)   Hunger Vital Sign    Worried About Running Out of Food in the Last Year: Never true    Ran Out of Food in the Last Year: Never true  Transportation Needs: No Transportation Needs (10/05/2022)   PRAPARE - Administrator, Civil Service (Medical): No    Lack of Transportation (Non-Medical): No  Physical Activity: Not on file  Stress: No Stress Concern Present (08/06/2022)   [employer] of Occupational Health - Occupational Stress Questionnaire    Feeling of Stress : Not at all  Social Connections: Unknown (08/06/2022)   Social Connection and Isolation Panel [NHANES]    Frequency of Communication with Friends and Family: Not on file    Frequency of Social Gatherings with Friends and Family: Not on file    Attends Religious Services: Not on file    Active Member of Clubs or Organizations: Not on file    Attends Banker Meetings: Not on file    Marital Status: Married  Intimate Partner Violence: Not At Risk (10/02/2022)   Humiliation, Afraid, Rape, and Kick questionnaire    Fear of Current or Ex-Partner: No    Emotionally Abused: No    Physically Abused: No    Sexually Abused: No    FAMILY HISTORY: Family History  Problem Relation Age of Onset   Hypertension Mother    Diabetes Father    Hypertension Father    Heart disease Father    Hypertension Sister    Throat cancer Sister    Hypertension Daughter    Breast cancer Neg Hx     ALLERGIES:  has no known allergies.  MEDICATIONS:  Current Outpatient Medications  Medication Sig Dispense Refill   amLODipine  (NORVASC ) 5 MG tablet TAKE 1 TABLET(5 MG) BY MOUTH EVERY EVENING 90 tablet 3   cholecalciferol  (VITAMIN D3) 25 MCG (1000 UNIT) tablet Take 1,000 Units by  mouth daily.     ibuprofen  (ADVIL ) 600 MG tablet Take 1 tablet (600 mg total) by mouth every 6 (six) hours as needed. 30 tablet 0   losartan  (COZAAR ) 25 MG tablet TAKE 1 TABLET(25 MG) BY MOUTH DAILY 90 tablet 3   metoprolol  succinate (TOPROL -XL) 50 MG 24 hr tablet TAKE 1 TABLET(50 MG) BY MOUTH DAILY WITH OR IMMEDIATELY FOLLOWING A MEAL 90 tablet 3   mupirocin  ointment (BACTROBAN ) 2 % Apply 1 Application topically 2 (two) times daily. 22 g 2   trospium  (SANCTURA ) 20 MG tablet Take 1 tablet (20 mg total) by mouth 2 (two) times daily. 180 tablet 2   vitamin C (ASCORBIC ACID ) 500 MG tablet  Take 500 mg by mouth daily.     No current facility-administered medications for this visit.     PHYSICAL EXAMINATION: ECOG PERFORMANCE STATUS: 0 - Asymptomatic Vitals:   08/22/23 1434  BP: (!) 155/81  Pulse: 74  Resp: 18  Temp: (!) 96.2 F (35.7 C)   Filed Weights   08/22/23 1434  Weight: 156 lb (70.8 kg)    Physical Exam Constitutional:      General: She is not in acute distress. HENT:     Head: Normocephalic and atraumatic.  Eyes:     General: No scleral icterus. Cardiovascular:     Rate and Rhythm: Normal rate.  Pulmonary:     Effort: Pulmonary effort is normal. No respiratory distress.  Abdominal:     General: There is no distension.  Musculoskeletal:        General: No deformity. Normal range of motion.     Cervical back: Normal range of motion and neck supple.  Skin:    General: Skin is warm and dry.     Findings: No erythema or rash.  Neurological:     Mental Status: She is alert and oriented to person, place, and time. Mental status is at baseline.     Cranial Nerves: No cranial nerve deficit.  Psychiatric:        Mood and Affect: Mood normal.     LABORATORY DATA:  I have reviewed the data as listed    Latest Ref Rng & Units 07/23/2023    8:08 AM 01/15/2023   10:06 AM 10/04/2022    5:28 AM  CBC  WBC 4.0 - 10.5 K/uL 8.6  10.2  12.4   Hemoglobin 12.0 - 15.0 g/dL 87.2   87.8  88.1   Hematocrit 36.0 - 46.0 % 40.0  38.0  35.3   Platelets 150 - 400 K/uL 274  316  222       Latest Ref Rng & Units 07/23/2023    8:08 AM 01/15/2023   10:06 AM 10/03/2022    6:05 AM  CMP  Glucose 70 - 99 mg/dL 97  897  881   BUN 8 - 23 mg/dL 26  20  17    Creatinine 0.44 - 1.00 mg/dL 8.87  8.97  8.87   Sodium 135 - 145 mmol/L 136  136  135   Potassium 3.5 - 5.1 mmol/L 4.1  4.3  4.1   Chloride 98 - 111 mmol/L 103  101  107   CO2 22 - 32 mmol/L 24  25  19    Calcium  8.9 - 10.3 mg/dL 9.1  9.2  8.6   Total Protein 6.5 - 8.1 g/dL 6.8  7.6    Total Bilirubin <1.2 mg/dL 0.5  0.4    Alkaline Phos 38 - 126 U/L 115  107    AST 15 - 41 U/L 18  20    ALT 0 - 44 U/L 14  12        RADIOGRAPHIC STUDIES: I have personally reviewed the radiological images as listed and agreed with the findings in the report. NM PET DOTATATE SKULL BASE TO MID THIGH Result Date: 07/31/2023 CLINICAL DATA:  Neuroendocrine tumor of the small bowel. Prior small bowel resection. EXAM: NUCLEAR MEDICINE PET SKULL BASE TO THIGH TECHNIQUE: 4.3 mCi copper  64 DOTATATE was injected intravenously. Full-ring PET imaging was performed from the skull base to thigh after the radiotracer. CT data was obtained and used for attenuation correction and anatomic localization. COMPARISON:  10/03/2021 FINDINGS: NECK  No radiotracer activity in neck lymph nodes. Incidental CT findings: None CHEST No radiotracer accumulation within mediastinal or hilar lymph nodes. No suspicious pulmonary nodules on the CT scan. Incidental CT finding:None ABDOMEN/PELVIS Interval resection of the hypermetabolic lesions associated with the small bowel and central small bowel mesentery of the lower abdomen. There is no residual hypermetabolic lesions within the small bowel. No hypermetabolic peritoneal or mesenteric implants. No abnormal metabolic activity within the liver. Physiologic activity noted in the liver, spleen, adrenal glands and kidneys. Incidental CT  findings:None SKELETON No focal activity to suggest skeletal metastasis. Incidental CT findings:None IMPRESSION: 1. No evidence of residual well-neuroendocrine tumor within the small bowel or mesentery following resection. 2. No evidence liver metastasis or distant metastatic disease. Electronically Signed   By: Jackquline Boxer M.D.   On: 07/31/2023 14:56

## 2023-08-26 ENCOUNTER — Telehealth: Payer: Self-pay

## 2023-08-26 ENCOUNTER — Ambulatory Visit: Payer: Medicare Other | Admitting: Family

## 2023-08-26 NOTE — Telephone Encounter (Signed)
 LVM to call back to reschedule appt for 08/26/2023

## 2023-09-06 ENCOUNTER — Ambulatory Visit (INDEPENDENT_AMBULATORY_CARE_PROVIDER_SITE_OTHER): Payer: Medicare Other | Admitting: Family

## 2023-09-06 ENCOUNTER — Telehealth: Payer: Self-pay | Admitting: Family

## 2023-09-06 ENCOUNTER — Encounter: Payer: Self-pay | Admitting: Family

## 2023-09-06 VITALS — BP 136/78 | HR 76 | Temp 98.3°F | Ht 63.0 in | Wt 156.8 lb

## 2023-09-06 DIAGNOSIS — Z780 Asymptomatic menopausal state: Secondary | ICD-10-CM | POA: Diagnosis not present

## 2023-09-06 DIAGNOSIS — Z1231 Encounter for screening mammogram for malignant neoplasm of breast: Secondary | ICD-10-CM

## 2023-09-06 DIAGNOSIS — C7A8 Other malignant neuroendocrine tumors: Secondary | ICD-10-CM

## 2023-09-06 DIAGNOSIS — R7303 Prediabetes: Secondary | ICD-10-CM

## 2023-09-06 DIAGNOSIS — N3281 Overactive bladder: Secondary | ICD-10-CM | POA: Diagnosis not present

## 2023-09-06 DIAGNOSIS — R7309 Other abnormal glucose: Secondary | ICD-10-CM

## 2023-09-06 DIAGNOSIS — I10 Essential (primary) hypertension: Secondary | ICD-10-CM

## 2023-09-06 LAB — POCT GLYCOSYLATED HEMOGLOBIN (HGB A1C): Hemoglobin A1C: 5.9 % — AB (ref 4.0–5.6)

## 2023-09-06 MED ORDER — AMLODIPINE BESYLATE 5 MG PO TABS: ORAL_TABLET | ORAL | 3 refills | Status: DC

## 2023-09-06 MED ORDER — TROSPIUM CHLORIDE 20 MG PO TABS: 20.0000 mg | ORAL_TABLET | Freq: Every day | ORAL | Status: DC

## 2023-09-06 NOTE — Assessment & Plan Note (Signed)
Chronic, stable .continue Norvasc 5 mg qd, losartan 25 mg qd,  Toprol 50 mg qd.

## 2023-09-06 NOTE — Assessment & Plan Note (Signed)
Symptoms are controlled with trospium 20 mg daily.  Dry mouth resolved with decreasing from twice daily to once daily.

## 2023-09-06 NOTE — Patient Instructions (Addendum)
Please call  and schedule your 3D mammogram and /or bone density scan as we discussed.   [facility]  ( new location in 2023)  [address], [place], [place] [address]  [place], [place]  [phone]   Happy New Year!

## 2023-09-06 NOTE — Progress Notes (Signed)
   Assessment & Plan:  Essential hypertension -     amLODIPine Besylate; TAKE 1 TABLET(5 MG) BY MOUTH EVERY EVENING  Dispense: 90 tablet; Refill: 3  OAB (overactive bladder) Assessment & Plan: Symptoms are controlled with trospium 20 mg daily.  Dry mouth resolved with decreasing from twice daily to once daily.   Orders: -     Trospium Chloride; Take 1 tablet (20 mg total) by mouth at bedtime.  Encounter for screening mammogram for malignant neoplasm of breast -     3D Screening Mammogram, Left and Right; Future  Asymptomatic postmenopausal state -     DG Bone Density; Future  Elevated glucose -     POCT glycosylated hemoglobin (Hb A1C)  Primary hypertension Assessment & Plan: Chronic, stable .continue Norvasc 5 mg qd, losartan 25 mg qd,  Toprol 50 mg qd.    Neuroendocrine carcinoma of small bowel [facility]) Assessment & Plan: Reviewed Dr. Bethanne Ginger note.  Will follow.    Prediabetes Assessment & Plan: Excellent control.  Will continue to follow      Return precautions given.   Risks, benefits, and alternatives of the medications and treatment plan prescribed today were discussed, and patient expressed understanding.   Education regarding symptom management and diagnosis given to patient on AVS either electronically or printed.  No follow-ups on file.  [name], FNP  Subjective:    Patient ID: Donna Ferguson, female    DOB: 1941/04/03, 83 y.o.   MRN: [id number]  CC: Donna Ferguson is a 83 y.o. female who presents today for follow up.   HPI: Feels well today.  No new complaints   she decreased trospium from BID to every day due to dry mouth, symptoms improved  History of bilateral knee replacements.  She questions if she requires antibiotics prior to dental cleaning.   Bowel resection 09/2022 Follow-up with Dr. Cathie Hoops this month.  PET scan last month without evidence of residual neuroendocrine tumor, metastatic disease.    Allergies: Patient has no known  allergies. Current Outpatient Medications on File Prior to Visit  Medication Sig Dispense Refill   cholecalciferol (VITAMIN D3) 25 MCG (1000 UNIT) tablet Take 1,000 Units by mouth daily.     ibuprofen (ADVIL) 600 MG tablet Take 1 tablet (600 mg total) by mouth every 6 (six) hours as needed. 30 tablet 0   losartan (COZAAR) 25 MG tablet TAKE 1 TABLET(25 MG) BY MOUTH DAILY 90 tablet 3   metoprolol succinate (TOPROL-XL) 50 MG 24 hr tablet TAKE 1 TABLET(50 MG) BY MOUTH DAILY WITH OR IMMEDIATELY FOLLOWING A MEAL 90 tablet 3   mupirocin ointment (BACTROBAN) 2 % Apply 1 Application topically 2 (two) times daily. 22 g 2   vitamin C (ASCORBIC ACID) 500 MG tablet Take 500 mg by mouth daily.     No current facility-administered medications on file prior to visit.    Review of Systems    Objective:    BP 136/78   Pulse 76   Temp 98.3 F (36.8 C) (Oral)   Ht 5\' 3"  (1.6 m)   Wt 156 lb 12.8 oz (71.1 kg)   SpO2 96%   BMI 27.78 kg/m  BP Readings from Last 3 Encounters:  09/06/23 136/78  08/22/23 (!) 155/81  04/05/23 110/75   Wt Readings from Last 3 Encounters:  09/06/23 156 lb 12.8 oz (71.1 kg)  08/22/23 156 lb (70.8 kg)  04/05/23 150 lb 3.2 oz (68.1 kg)    Physical Exam

## 2023-09-06 NOTE — Assessment & Plan Note (Signed)
Excellent control.  Will continue to follow

## 2023-09-06 NOTE — Assessment & Plan Note (Signed)
Reviewed Dr. Bethanne Ginger note.  Will follow.

## 2023-09-06 NOTE — Telephone Encounter (Signed)
Call Dr Joice Lofts  [phone]  Pt asking today if she needs prophylactic antibiotics ahead of dental procedure d/t bilateral knee replacement

## 2023-09-09 NOTE — Telephone Encounter (Signed)
close

## 2023-09-10 NOTE — Telephone Encounter (Signed)
Spoke to Dr. Binnie Rail office and they stated that he recommends all pt's that have had a surgery take prophylactic antibiotics before a dental procedure.

## 2023-09-10 NOTE — Telephone Encounter (Signed)
LMTCB

## 2023-09-12 ENCOUNTER — Telehealth: Payer: Self-pay

## 2023-09-12 NOTE — Telephone Encounter (Signed)
LVM to call back to ask pt per Donna Ferguson Does she need me to send in amoxillin to have prior to dental cleaning?

## 2023-09-12 NOTE — Telephone Encounter (Signed)
Copied from CRM [id number]. Topic: General - Other >> Sep 12, 2023  3:57 PM Truddie Crumble wrote: Reason for CRM: pt returning a call to [facility]

## 2023-09-12 NOTE — Telephone Encounter (Signed)
Spoke to pt and she stated that she would like for you to send in the Amoxillin in to her pharmacy before her procedure

## 2023-09-12 NOTE — Telephone Encounter (Signed)
Spoke to pt in regards to message per Claris Che regarding dental procedure

## 2023-09-16 ENCOUNTER — Other Ambulatory Visit: Payer: Self-pay | Admitting: Family

## 2023-09-16 DIAGNOSIS — Z96653 Presence of artificial knee joint, bilateral: Secondary | ICD-10-CM

## 2023-09-16 MED ORDER — AMOXICILLIN 500 MG PO TABS: 2000.0000 mg | ORAL_TABLET | Freq: Once | ORAL | 2 refills | Status: AC

## 2023-09-16 NOTE — Telephone Encounter (Signed)
Spoke to pt pt is aware

## 2023-09-23 ENCOUNTER — Ambulatory Visit (INDEPENDENT_AMBULATORY_CARE_PROVIDER_SITE_OTHER): Payer: Medicare Other | Admitting: *Deleted

## 2023-09-23 VITALS — Ht 63.0 in | Wt 149.0 lb

## 2023-09-23 DIAGNOSIS — Z0000 Encounter for general adult medical examination without abnormal findings: Secondary | ICD-10-CM

## 2023-09-23 NOTE — Progress Notes (Signed)
Subjective:   Donna Ferguson is a 83 y.o. female who presents for Medicare Annual (Subsequent) preventive examination.  Visit Complete: Virtual I connected with  Donna Ferguson on 09/23/23 by a audio enabled telemedicine application and verified that I am speaking with the correct person using two identifiers.This patient declined Interactive audio and Acupuncturist. Therefore the visit was completed with audio only.   Patient Location: Home  Provider Location: Office/Clinic  I discussed the limitations of evaluation and management by telemedicine. The patient expressed understanding and agreed to proceed.  Vital Signs: Because this visit was a virtual/telehealth visit, some criteria may be missing or patient reported. Any vitals not documented were not able to be obtained and vitals that have been documented are patient reported.   Cardiac Risk Factors include: advanced age (>5men, >11 women);hypertension     Objective:    Today's Vitals   09/23/23 1055  Weight: 149 lb (67.6 kg)  Height: 5\' 3"  (1.6 m)   Body mass index is 26.39 kg/m.     09/23/2023   11:09 AM 08/22/2023    2:44 PM 10/16/2022    1:20 PM 10/02/2022    2:26 PM 10/02/2022    6:23 AM 09/24/2022    2:57 PM 08/06/2022    1:20 PM  Advanced Directives  Does Patient Have a Medical Advance Directive? No Yes Yes Yes No Yes No  Type of Special educational needs teacher of [place];Living will Healthcare Power of [employer] of [place];Living will  Living will;Healthcare Power of Attorney   Does patient want to make changes to medical advance directive?    No - Patient declined No - Patient declined    Copy of Healthcare Power of Attorney in Chart?  No - copy requested No - copy requested No - copy requested     Would patient like information on creating a medical advance directive? No - Patient declined   No - Patient declined No - Patient declined  No - Patient declined    Current Medications  (verified) Outpatient Encounter Medications as of 09/23/2023  Medication Sig   acetaminophen (TYLENOL) 500 MG tablet Take 500 mg by mouth every 6 (six) hours as needed.   amLODipine (NORVASC) 5 MG tablet TAKE 1 TABLET(5 MG) BY MOUTH EVERY EVENING   cholecalciferol (VITAMIN D3) 25 MCG (1000 UNIT) tablet Take 1,000 Units by mouth daily.   losartan (COZAAR) 25 MG tablet TAKE 1 TABLET(25 MG) BY MOUTH DAILY   metoprolol succinate (TOPROL-XL) 50 MG 24 hr tablet TAKE 1 TABLET(50 MG) BY MOUTH DAILY WITH OR IMMEDIATELY FOLLOWING A MEAL   trospium (SANCTURA) 20 MG tablet Take 1 tablet (20 mg total) by mouth at bedtime.   vitamin C (ASCORBIC ACID) 500 MG tablet Take 1,000 mg by mouth daily.   ibuprofen (ADVIL) 600 MG tablet Take 1 tablet (600 mg total) by mouth every 6 (six) hours as needed. (Patient not taking: Reported on 09/23/2023)   [DISCONTINUED] mupirocin ointment (BACTROBAN) 2 % Apply 1 Application topically 2 (two) times daily. (Patient not taking: Reported on 09/23/2023)   No facility-administered encounter medications on file as of 09/23/2023.    Allergies (verified) Patient has no known allergies.   History: Past Medical History:  Diagnosis Date   Aortic atherosclerosis (HCC)    Arthritis    Chronic kidney disease, stage 3a (HCC)    Constipation    Diastolic dysfunction 01/04/2020   a.) TTE 01/04/2020: EF 60-65%, mild LVH, triv MR, mild-mod AoV sclerosis without  stenosis, G1DD.   Diverticulosis    Dyspnea    Heart murmur    History of colonoscopy    2019 in [place]; patient states no longer screening for colon cancer.   Hypertension    IBS (irritable bowel syndrome)    Lower back pain    Pneumonia    PONV (postoperative nausea and vomiting)    Pre-diabetes    Renal cyst    Right leg DVT (HCC)    Past Surgical History:  Procedure Laterality Date   ABDOMINAL HYSTERECTOMY     unsure if has ovaries.    BOWEL RESECTION N/A 10/02/2022   Procedure: SMALL BOWEL RESECTION, open, possible  colectomy, RNFA to assist;  Surgeon: Leafy Ro, MD;  Location: [facility] ORS;  Service: General;  Laterality: N/A;   BREAST CYST EXCISION Bilateral    BREAST EXCISIONAL BIOPSY Bilateral late 80s, early 90s    benign   BREAST SURGERY     COLONOSCOPY     COLONOSCOPY WITH PROPOFOL N/A 05/31/2022   Procedure: COLONOSCOPY WITH PROPOFOL;  Surgeon: Toney Reil, MD;  Location: [facility] ENDOSCOPY;  Service: Gastroenterology;  Laterality: N/A;   JOINT REPLACEMENT     REPLACEMENT TOTAL KNEE Left 2017   TOTAL KNEE ARTHROPLASTY Right 12/28/2021   Procedure: TOTAL KNEE ARTHROPLASTY;  Surgeon: Christena Flake, MD;  Location: [facility] ORS;  Service: Orthopedics;  Laterality: Right;   Family History  Problem Relation Age of Onset   Hypertension Mother    Diabetes Father    Hypertension Father    Heart disease Father    Hypertension Sister    Throat cancer Sister    Hypertension Daughter    Breast cancer Neg Hx    Social History   Socioeconomic History   Marital status: Married    Spouse name: Roosevelt   Number of children: 2   Years of education: Not on file   Highest education level: Not on file  Occupational History   Not on file  Tobacco Use   Smoking status: Never    Passive exposure: Past   Smokeless tobacco: Never  Vaping Use   Vaping status: Never Used  Substance and Sexual Activity   Alcohol use: Never   Drug use: Never   Sexual activity: Yes    Birth control/protection: Surgical  Other Topics Concern   Not on file  Social History Narrative   [employer]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    Enjoys painting   Social Drivers of Corporate investment banker Strain: Low Risk  (09/23/2023)   Overall Financial Resource Strain (CARDIA)    Difficulty of Paying Living Expenses: Not hard at all  Food Insecurity: No Food Insecurity (09/23/2023)   Hunger Vital Sign     Worried About Running Out of Food in the Last Year: Never true    Ran Out of Food in the Last Year: Never true  Transportation Needs: No Transportation Needs (09/23/2023)   PRAPARE - Administrator, Civil Service (Medical): No    Lack of Transportation (Non-Medical): No  Physical Activity: Inactive (09/23/2023)   Exercise Vital Sign    Days of Exercise per Week: 0 days    Minutes of Exercise per Session: 0 min  Stress: No Stress Concern Present (09/23/2023)   [employer] of Occupational Health - Occupational Stress Questionnaire  Feeling of Stress : Not at all  Social Connections: Moderately Isolated (09/23/2023)   Social Connection and Isolation Panel [NHANES]    Frequency of Communication with Friends and Family: More than three times a week    Frequency of Social Gatherings with Friends and Family: More than three times a week    Attends Religious Services: Never    Database administrator or Organizations: No    Attends Engineer, structural: Never    Marital Status: Married    Tobacco Counseling Counseling given: Not Answered   Clinical Intake:  Pre-visit preparation completed: Yes  Pain : No/denies pain     BMI - recorded: 26.39 Nutritional Status: BMI 25 -29 Overweight Nutritional Risks: None Diabetes: No  How often do you need to have someone help you when you read instructions, pamphlets, or other written materials from your doctor or pharmacy?: 1 - Never  Interpreter Needed?: No  Information entered by :: R. Ferlando Lia LPN   Activities of Daily Living    09/23/2023   10:57 AM 10/02/2022    2:28 PM  In your present state of health, do you have any difficulty performing the following activities:  Hearing? 0 0  Vision? 0 0  Comment glasses   Difficulty concentrating or making decisions? 0 0  Walking or climbing stairs? 1 0  Comment H/O knee surgery   Dressing or bathing? 0 0  Doing errands, shopping? 0 0  Preparing Food and eating ? N    Using the Toilet? N   In the past six months, have you accidently leaked urine? N   Do you have problems with loss of bowel control? N   Managing your Medications? N   Managing your Finances? N   Housekeeping or managing your Housekeeping? N     Patient Care Team: Allegra Grana, FNP as PCP - General (Family Medicine) Debbe Odea, MD as PCP - Cardiology (Cardiology) Rickard Patience, MD as Consulting Physician (Oncology) Benita Gutter, RN as Oncology Nurse Navigator  Indicate any recent Medical Services you may have received from other than [facility] providers in the past year (date may be approximate).     Assessment:   This is a routine wellness examination for Josilynn.  Hearing/Vision screen Hearing Screening - Comments:: No issues Vision Screening - Comments:: glasses   Goals Addressed             This Visit's Progress    Patient Stated       Wants to try to start back walking       Depression Screen    09/23/2023   11:03 AM 09/06/2023    9:41 AM 02/05/2023   10:37 AM 10/12/2022   10:36 AM 08/06/2022    1:19 PM 05/16/2022    1:45 PM 04/02/2022   10:29 AM  PHQ 2/9 Scores  PHQ - 2 Score 0 0 0 0 0 0 0  PHQ- 9 Score 4          Fall Risk    09/23/2023   10:59 AM 09/06/2023    9:41 AM 02/05/2023   10:37 AM 10/22/2022   11:19 AM 10/12/2022   10:36 AM  Fall Risk   Falls in the past year? 0 0 0 0 0  Number falls in past yr: 0 0 0  0  Injury with Fall? 0 0 0  0  Risk for fall due to : No Fall Risks No Fall Risks No Fall Risks  No  Fall Risks  Follow up Falls prevention discussed;Falls evaluation completed Falls evaluation completed Falls evaluation completed  Falls evaluation completed    MEDICARE RISK AT HOME: Medicare Risk at Home Any stairs in or around the home?: Yes If so, are there any without handrails?: No Home free of loose throw rugs in walkways, pet beds, electrical cords, etc?: Yes Adequate lighting in your home to reduce risk of falls?: Yes Life  alert?: No Use of a cane, walker or w/c?: No Grab bars in the bathroom?: Yes Shower chair or bench in shower?: Yes Elevated toilet seat or a handicapped toilet?: Yes   Cognitive Function:        09/23/2023   11:09 AM 08/06/2022    1:25 PM  6CIT Screen  What Year? 0 points   What month? 0 points   What time? 0 points   Count back from 20 0 points   Months in reverse 0 points 0 points  Repeat phrase 2 points   Total Score 2 points     Immunizations Immunization History  Administered Date(s) Administered   Fluad Quad(high Dose 65+) 05/04/2019, 06/14/2021   Influenza, High Dose Seasonal PF 06/03/2020   PFIZER(Purple Top)SARS-COV-2 Vaccination 09/22/2019, 10/13/2019, 05/18/2020, 03/13/2021   PNEUMOCOCCAL CONJUGATE-20 12/07/2020   Zoster Recombinant(Shingrix) 01/03/2018, 11/21/2019    TDAP status: Due, Education has been provided regarding the importance of this vaccine. Advised may receive this vaccine at local pharmacy or Health Dept. Aware to provide a copy of the vaccination record if obtained from local pharmacy or Health Dept. Verbalized acceptance and understanding.  Flu Vaccine status: Up to date  Pneumococcal vaccine status: Up to date  Covid-19 vaccine status: Completed vaccines  Qualifies for Shingles Vaccine? Yes   Zostavax completed No Shingrix Completed?: Yes  Screening Tests Health Maintenance  Topic Date Due   DTaP/Tdap/Td (1 - Tdap) Never done   COVID-19 Vaccine (5 - 2024-25 season) 04/21/2023   Medicare Annual Wellness (AWV)  08/07/2023   INFLUENZA VACCINE  11/18/2023 (Originally 03/21/2023)   Colonoscopy  06/01/2027   Pneumonia Vaccine 44+ Years old  Completed   DEXA SCAN  Completed   Zoster Vaccines- Shingrix  Completed   HPV VACCINES  Aged Out    Health Maintenance  Health Maintenance Due  Topic Date Due   DTaP/Tdap/Td (1 - Tdap) Never done   COVID-19 Vaccine (5 - 2024-25 season) 04/21/2023   Medicare Annual Wellness (AWV)  08/07/2023     Colorectal cancer screening: Type of screening: Colonoscopy. Completed 05/2022. Repeat every 5 years  Mammogram Status Order placed 09/06/23  Bone Density status: Ordered 09/06/23. Pt provided with contact info and advised to call to schedule appt.Order was placed by PCP   Lung Cancer Screening: (Low Dose CT Chest recommended if Age 62-80 years, 20 pack-year currently smoking OR have quit w/in 15years.) does not qualify.     Additional Screening:  Hepatitis C Screening: does not qualify; Completed NA age  Vision Screening: Recommended annual ophthalmology exams for early detection of glaucoma and other disorders of the eye. Is the patient up to date with their annual eye exam?  Yes  Who is the provider or what is the name of the office in which the patient attends annual eye exams? [facility] If pt is not established with a provider, would they like to be referred to a provider to establish care? No .   Dental Screening: Recommended annual dental exams for proper oral hygiene   Community Resource Referral / Chronic  Care Management: CRR required this visit?  No   CCM required this visit?  No     Plan:     I have personally reviewed and noted the following in the patient's chart:   Medical and social history Use of alcohol, tobacco or illicit drugs  Current medications and supplements including opioid prescriptions. Patient is not currently taking opioid prescriptions. Functional ability and status Nutritional status Physical activity Advanced directives List of other physicians Hospitalizations, surgeries, and ER visits in previous 12 months Vitals Screenings to include cognitive, depression, and falls Referrals and appointments  In addition, I have reviewed and discussed with patient certain preventive protocols, quality metrics, and best practice recommendations. A written personalized care plan for preventive services as well as general preventive health  recommendations were provided to patient.     Sydell Axon, LPN   09/28/5282   After Visit Summary: (MyChart) Due to this being a telephonic visit, the after visit summary with patients personalized plan was offered to patient via MyChart   Nurse Notes: None

## 2023-09-23 NOTE — Patient Instructions (Signed)
Donna Ferguson , Thank you for taking time to come for your Medicare Wellness Visit. I appreciate your ongoing commitment to your health goals. Please review the following plan we discussed and let me know if I can assist you in the future.   Referrals/Orders/Follow-Ups/Clinician Recommendations: Remember to update your Tetanus vaccine.  This is a list of the screening recommended for you and due dates:  Health Maintenance  Topic Date Due   DTaP/Tdap/Td vaccine (1 - Tdap) Never done   COVID-19 Vaccine (5 - 2024-25 season) 04/21/2023   Flu Shot  11/18/2023*   Mammogram  12/27/2023   Medicare Annual Wellness Visit  09/22/2024   Colon Cancer Screening  06/01/2027   Pneumonia Vaccine  Completed   DEXA scan (bone density measurement)  Completed   Zoster (Shingles) Vaccine  Completed   HPV Vaccine  Aged Out  *Topic was postponed. The date shown is not the original due date.    Advanced directives: (Declined) Advance directive discussed with you today. Even though you declined this today, please call our office should you change your mind, and we can give you the proper paperwork for you to fill out.  Next Medicare Annual Wellness Visit scheduled for next year: Yes 09/29/24 @ 10:50

## 2023-10-03 ENCOUNTER — Ambulatory Visit: Payer: Medicare Other | Admitting: Cardiology

## 2023-10-05 ENCOUNTER — Other Ambulatory Visit: Payer: Self-pay | Admitting: Family

## 2023-10-05 DIAGNOSIS — I10 Essential (primary) hypertension: Secondary | ICD-10-CM

## 2023-11-04 ENCOUNTER — Other Ambulatory Visit: Payer: Self-pay | Admitting: Family

## 2023-11-04 DIAGNOSIS — N3281 Overactive bladder: Secondary | ICD-10-CM

## 2023-11-11 ENCOUNTER — Ambulatory Visit: Attending: Cardiology | Admitting: Cardiology

## 2023-11-11 ENCOUNTER — Ambulatory Visit: Payer: Medicare Other | Admitting: Cardiology

## 2023-11-11 ENCOUNTER — Encounter: Payer: Self-pay | Admitting: Cardiology

## 2023-11-11 VITALS — BP 118/74 | HR 65 | Ht 63.0 in | Wt 156.4 lb

## 2023-11-11 DIAGNOSIS — I10 Essential (primary) hypertension: Secondary | ICD-10-CM | POA: Insufficient documentation

## 2023-11-11 DIAGNOSIS — I340 Nonrheumatic mitral (valve) insufficiency: Secondary | ICD-10-CM | POA: Insufficient documentation

## 2023-11-11 NOTE — Progress Notes (Signed)
 Cardiology Office Note:    Date:  11/11/2023   ID:  Donna Ferguson, Donna Ferguson 1940-11-16, MRN [id number]  PCP:  Donna Grana, FNP  Cardiologist:  Donna Odea, MD  Electrophysiologist:  None   Referring MD: Donna Grana, FNP   No chief complaint on file.   History of Present Illness:    Donna Ferguson is a 83 y.o. female with a hx of hypertension, knee arthritis, trivial mitral valve insufficiency, who presents for preop evaluation.  Previously seen for preop evaluation prior to abdominal mass resection.  BP was elevated at the time.  Previous echo showed no significant structural abnormalities.  Underwent abdominal surgery successfully, no adverse effects.  Compliant with BP medications as prescribed.  Feels well, has no concerns at this time.  Prior notes Echo 01/04/2020 EF 60 to 65%, impaired relaxation, trivial MR.   Past Medical History:  Diagnosis Date   Aortic atherosclerosis (HCC)    Arthritis    Chronic kidney disease, stage 3a (HCC)    Constipation    Diastolic dysfunction 01/04/2020   a.) TTE 01/04/2020: EF 60-65%, mild LVH, triv MR, mild-mod AoV sclerosis without stenosis, G1DD.   Diverticulosis    Dyspnea    Heart murmur    History of colonoscopy    2019 in [place]; patient states no longer screening for colon cancer.   Hypertension    IBS (irritable bowel syndrome)    Lower back pain    Pneumonia    PONV (postoperative nausea and vomiting)    Pre-diabetes    Renal cyst    Right leg DVT (HCC)     Past Surgical History:  Procedure Laterality Date   ABDOMINAL HYSTERECTOMY     unsure if has ovaries.    BOWEL RESECTION N/A 10/02/2022   Procedure: SMALL BOWEL RESECTION, open, possible colectomy, RNFA to assist;  Surgeon: Donna Ro, MD;  Location: [facility] ORS;  Service: General;  Laterality: N/A;   BREAST CYST EXCISION Bilateral    BREAST EXCISIONAL BIOPSY Bilateral late 80s, early 90s    benign   BREAST SURGERY     COLONOSCOPY     COLONOSCOPY  WITH PROPOFOL N/A 05/31/2022   Procedure: COLONOSCOPY WITH PROPOFOL;  Surgeon: Donna Reil, MD;  Location: [facility] ENDOSCOPY;  Service: Gastroenterology;  Laterality: N/A;   JOINT REPLACEMENT     REPLACEMENT TOTAL KNEE Left 2017   TOTAL KNEE ARTHROPLASTY Right 12/28/2021   Procedure: TOTAL KNEE ARTHROPLASTY;  Surgeon: Donna Flake, MD;  Location: [facility] ORS;  Service: Orthopedics;  Laterality: Right;    Current Medications: Current Meds  Medication Sig   acetaminophen (TYLENOL) 500 MG tablet Take 500 mg by mouth every 6 (six) hours as needed.   amLODipine (NORVASC) 5 MG tablet TAKE 1 TABLET(5 MG) BY MOUTH EVERY EVENING   cholecalciferol (VITAMIN D3) 25 MCG (1000 UNIT) tablet Take 1,000 Units by mouth daily.   losartan (COZAAR) 25 MG tablet TAKE 1 TABLET(25 MG) BY MOUTH DAILY   metoprolol succinate (TOPROL-XL) 50 MG 24 hr tablet TAKE 1 TABLET(50 MG) BY MOUTH DAILY WITH OR IMMEDIATELY FOLLOWING A MEAL   trospium (SANCTURA) 20 MG tablet Take 1 tablet (20 mg total) by mouth at bedtime.   vitamin C (ASCORBIC ACID) 500 MG tablet Take 1,000 mg by mouth daily.     Allergies:   Patient has no known allergies.   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    Enjoys painting   Social Drivers of Research scientist (physical sciences) Strain: Low Risk  (09/23/2023)   Overall Financial Resource Strain (CARDIA)    Difficulty of Paying Living Expenses: Not hard at all  Food Insecurity: No Food Insecurity (09/23/2023)   Hunger Vital Sign    Worried About Running Out of Food in the Last Year: Never true    Ran Out of Food in the Last Year: Never true  Transportation Needs: No Transportation Needs (09/23/2023)   PRAPARE - Administrator, Civil Service (Medical): No    Lack of Transportation (Non-Medical): No  Physical Activity: Inactive (09/23/2023)   Exercise Vital Sign    Days of Exercise per Week: 0 days    Minutes of Exercise per Session: 0 min  Stress: No Stress Concern Present (09/23/2023)   [employer] of Occupational Health - Occupational Stress Questionnaire    Feeling of Stress : Not at all  Social Connections: Moderately Isolated (09/23/2023)   Social Connection and Isolation Panel [NHANES]    Frequency of Communication with Friends and Family: More than three times a week    Frequency of Social Gatherings with Friends and Family: More than three times a week    Attends Religious Services: Never    Database administrator or Organizations: No    Attends Engineer, structural: Never    Marital Status: Married     Family History: The patient's family history includes Diabetes in her father; Heart disease in her father; Hypertension in her daughter, father, mother, and sister; Throat cancer in her sister. There is no history of Breast cancer.  ROS:   Please see the history of present illness.     All other systems reviewed and are negative.   EKGs/Labs/Other Studies Reviewed:    The following studies were reviewed today:  EKG Interpretation Date/Time:  Monday November 11 2023 13:53:23 EDT Ventricular Rate:  65 PR Interval:  138 QRS Duration:  68 QT Interval:  392 QTC Calculation: 407 R Axis:   14  Text Interpretation: Normal sinus rhythm Normal ECG Confirmed by Donna Ferguson  [phone]) on 11/11/2023 2:00:56 PM    Recent Labs: 07/23/2023: ALT 14; BUN 26; Creatinine 1.12; Hemoglobin 12.7; Platelet Count 274; Potassium 4.1; Sodium 136  Recent Lipid Panel    Component Value Date/Time   CHOL 126 04/19/2023 0925   TRIG 152.0 (H) 04/19/2023 0925   HDL 52.60 04/19/2023 0925   CHOLHDL 2 04/19/2023 0925   VLDL 30.4 04/19/2023 0925   LDLCALC 43 04/19/2023 0925    Physical Exam:    VS:  BP 118/74   Pulse 65   Ht 5\' 3"  (1.6 m)   Wt 156 lb 6.4 oz (70.9  kg)   SpO2 98%   BMI 27.71 kg/m     Wt Readings from Last 3 Encounters:  11/11/23 156 lb 6.4 oz (70.9 kg)  09/23/23 149 lb (67.6 kg)  09/06/23 156 lb 12.8 oz (71.1 kg)     GEN:  Well nourished, well developed in no acute distress HEENT: Normal NECK: No JVD; No carotid bruits CARDIAC: RRR, no murmurs, rubs, gallops RESPIRATORY:  Clear to auscultation without rales, wheezing or rhonchi  ABDOMEN: Soft, non-tender, non-distended MUSCULOSKELETAL:  No edema; No deformity  SKIN: Warm and dry NEUROLOGIC:  Alert and oriented x 3 PSYCHIATRIC:  Normal affect   ASSESSMENT:    1. Primary hypertension   2. Mitral valve insufficiency, unspecified etiology    PLAN:    In order of problems listed above:  Hypertension, BP controlled.  Continue losartan 25 mg daily, Toprol-XL 25 mg daily,, amlodipine. Trivial MR, physiologic.  Asymptomatic.  Management of BP as above.  Follow-up as needed.   Medication Adjustments/Labs and Tests Ordered: Current medicines are reviewed at length with the patient today.  Concerns regarding medicines are outlined above.  Orders Placed This Encounter  Procedures   EKG 12-Lead    No orders of the defined types were placed in this encounter.    Patient Instructions  Medication Instructions:  Your physician recommends that you continue on your current medications as directed. Please refer to the Current Medication list given to you today.  *If you need a refill on your cardiac  medications before your next appointment, please call your pharmacy*   Lab Work: None If you have labs (blood work) drawn today and your tests are completely normal, you will receive your results only by: MyChart Message (if you have MyChart) OR A paper copy in the mail If you have any lab test that is abnormal or we need to change your treatment, we will call you to review the results.   Testing/Procedures: None   Follow-Up: At [facility], you and your health needs are our priority.  As part of our continuing mission to provide you with exceptional heart care, we have created designated Provider Care Teams.  These Care Teams include your primary Cardiologist (physician) and Advanced Practice Providers (APPs -  Physician Assistants and Nurse Practitioners) who all work together to provide you with the care you need, when you need it.  We recommend signing up for the patient portal called "MyChart".  Sign up information is provided on this After Visit Summary.  MyChart is used to connect with patients for Virtual Visits (Telemedicine).  Patients are able to view lab/test results, encounter notes, upcoming appointments, etc.  Non-urgent messages can be sent to your provider as well.   To learn more about what you can do with MyChart, go to [email or web address].    Your next appointment:   Follow up as needed  Provider:   Dr. Myriam Forehand - Sandie Ano  Other Instructions None       Signed, Donna Odea, MD  11/11/2023 2:27 PM    [facility]

## 2023-11-11 NOTE — Patient Instructions (Signed)
 Medication Instructions:  Your physician recommends that you continue on your current medications as directed. Please refer to the Current Medication list given to you today.  *If you need a refill on your cardiac medications before your next appointment, please call your pharmacy*   Lab Work: None If you have labs (blood work) drawn today and your tests are completely normal, you will receive your results only by: MyChart Message (if you have MyChart) OR A paper copy in the mail If you have any lab test that is abnormal or we need to change your treatment, we will call you to review the results.   Testing/Procedures: None   Follow-Up: At [facility], you and your health needs are our priority.  As part of our continuing mission to provide you with exceptional heart care, we have created designated Provider Care Teams.  These Care Teams include your primary Cardiologist (physician) and Advanced Practice Providers (APPs -  Physician Assistants and Nurse Practitioners) who all work together to provide you with the care you need, when you need it.  We recommend signing up for the patient portal called "MyChart".  Sign up information is provided on this After Visit Summary.  MyChart is used to connect with patients for Virtual Visits (Telemedicine).  Patients are able to view lab/test results, encounter notes, upcoming appointments, etc.  Non-urgent messages can be sent to your provider as well.   To learn more about what you can do with MyChart, go to [email or web address].    Your next appointment:   Follow up as needed  Provider:   Dr. Myriam Forehand - Sandie Ano  Other Instructions None

## 2023-12-09 ENCOUNTER — Ambulatory Visit
Admission: RE | Admit: 2023-12-09 | Discharge: 2023-12-09 | Disposition: A | Discharge status: Home / Self Care | Payer: Medicare Other | Source: Ambulatory Visit | Attending: Family | Admitting: Family

## 2023-12-09 DIAGNOSIS — Z780 Asymptomatic menopausal state: Secondary | ICD-10-CM | POA: Insufficient documentation

## 2023-12-09 DIAGNOSIS — Z1231 Encounter for screening mammogram for malignant neoplasm of breast: Secondary | ICD-10-CM | POA: Insufficient documentation

## 2023-12-14 ENCOUNTER — Encounter: Payer: Self-pay | Admitting: Family

## 2023-12-31 ENCOUNTER — Ambulatory Visit: Payer: Self-pay

## 2023-12-31 NOTE — Telephone Encounter (Signed)
 LVM to call back to go over Bone Density results below     Hi Donna Ferguson,    Your dexa scan shows osteopenia.    The threshold for treatment is 3% or greater for hip fracture or 20% or greater for major osteoporotic fracture which you are under ( borderline at hip). It is reasonable to continue to monitor. If you would like to make an appointment to discuss in person, I am happy to do that and you may call our office.    We need to follow this and monitor again in 2 more years to see if improved or worsened.   For post menopausal women, guidelines recommend a diet with 1200 mg of Calcium  per day. If you are eating calcium  rich foods, you do not need a calcium  supplement. The body better absorbs the calcium  that you eat over supplementation. If you do supplement, I recommend not supplementing the full 1200 mg/ day as this can lead to increased risk of cardiovascular disease. I recommend Calcium  Citrate over the counter, and you may take a total of 600 to 800 mg per day in divided doses with meals for best absorption.    For bone health, you need adequate vitamin D , and I recommend you supplement as it is harder to do so with diet alone. I recommend cholecalciferol  800 units daily.  Also, please ensure you are following a diet high in calcium  -- research shows better outcomes with dietary sources including kale, yogurt, broccolii, cheese, okra, almonds- to name a few.       Also remember that exercise is a great medicine for maintain and preserve bone health. Advise moderate exercise for 30 minutes , 3 times per week.      Lyell Samuel, NP

## 2024-01-07 ENCOUNTER — Other Ambulatory Visit: Payer: Self-pay | Admitting: Family

## 2024-01-07 DIAGNOSIS — I10 Essential (primary) hypertension: Secondary | ICD-10-CM

## 2024-01-08 ENCOUNTER — Ambulatory Visit (INDEPENDENT_AMBULATORY_CARE_PROVIDER_SITE_OTHER): Admitting: Family

## 2024-01-08 ENCOUNTER — Other Ambulatory Visit: Payer: Self-pay | Admitting: Family

## 2024-01-08 ENCOUNTER — Ambulatory Visit (INDEPENDENT_AMBULATORY_CARE_PROVIDER_SITE_OTHER)

## 2024-01-08 VITALS — BP 118/70 | HR 90 | Temp 98.3°F | Ht 63.0 in | Wt 155.0 lb

## 2024-01-08 DIAGNOSIS — N183 Chronic kidney disease, stage 3 unspecified: Secondary | ICD-10-CM

## 2024-01-08 DIAGNOSIS — M8580 Other specified disorders of bone density and structure, unspecified site: Secondary | ICD-10-CM | POA: Insufficient documentation

## 2024-01-08 DIAGNOSIS — R109 Unspecified abdominal pain: Secondary | ICD-10-CM

## 2024-01-08 DIAGNOSIS — I10 Essential (primary) hypertension: Secondary | ICD-10-CM

## 2024-01-08 LAB — VITAMIN D 25 HYDROXY (VIT D DEFICIENCY, FRACTURES): VITD: 44.63 ng/mL (ref 30.00–100.00)

## 2024-01-08 NOTE — Assessment & Plan Note (Signed)
 Reviewed bone density.  Discussed FRAX risk.  Patient politely declines starting biphosphonate at this time.  We discussed monitoring calcium  and vitamin D .  Encouraged regular exercise.  Repeat bone density in 2 years.

## 2024-01-08 NOTE — Progress Notes (Signed)
 Assessment & Plan:  Abdominal pain, unspecified abdominal location Assessment & Plan: Reassuring exam.  Abdomen is soft. Crt cl 44 ml/min. Differential includes GERD, constipation.  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  Advised to be more consistent with MiraLAX  such as 2 or 3 days/week.  Start Pepcid  over-the-counter every other day due to CKD. Pending Xr ab.   Orders: -     DG Abd 1 View; Future  Osteopenia, unspecified location Assessment & Plan: Reviewed bone density.  Discussed FRAX risk.  Patient politely declines starting biphosphonate at this time.  We discussed monitoring calcium  and vitamin D .  Encouraged regular exercise.  Repeat bone density in 2 years.  Orders: -     VITAMIN D  25 Hydroxy (Vit-D Deficiency, Fractures)  Stage 3 chronic kidney disease, unspecified whether stage 3a or 3b CKD (HCC) -     VITAMIN D  25 Hydroxy (Vit-D Deficiency, Fractures)     Return precautions given.   Risks, benefits, and alternatives of the medications and treatment plan prescribed today were discussed, and patient expressed understanding.   Education regarding symptom management and diagnosis given to patient on AVS either electronically or printed.  Return in about 2 months (around 03/09/2024).  Bascom Bossier, FNP  Subjective:    Patient ID: Donna Ferguson, female    DOB: 1940/12/29, 83 y.o.   MRN: [id number]  CC: Donna Ferguson is a 83 y.o. female who presents today for follow up.   HPI: Here to discuss osteopenia    Compliant with vitamin D  1000 units every day; she is not taking calcium   She is working in the yard.   calcium  9.1 GFR 50 Crt 1.12  She also complains of BL lower abdominal pain, x 3 days, improved Onset after onions, peppers and tomatoes.  Endorses increased gas, burping, occassional constipation Denies f, dysuria, n, diarrhea, cp ,flank pain.  She didn't take any medication for this.  She may takes pepcid  , miralax  prn.     Dr. 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  Status post resection. Negative NM PET dotatate scan 07/30/24; repeat in one year Colonoscopy 05/31/22,up-to-date, repeat in 5 years. Allergies: Patient has no known allergies. Current Outpatient Medications on File Prior to Visit  Medication Sig Dispense Refill   acetaminophen  (TYLENOL ) 500 MG tablet Take 500 mg by mouth every 6 (six) hours as needed.     amLODipine  (NORVASC ) 5 MG tablet TAKE 1 TABLET(5 MG) BY MOUTH EVERY EVENING 90 tablet 3   cholecalciferol  (VITAMIN D3) 25 MCG (1000 UNIT) tablet Take 1,000 Units by mouth daily.     ibuprofen  (ADVIL ) 600 MG tablet Take 1 tablet (600 mg total) by mouth every 6 (six) hours as needed. 30 tablet 0   losartan  (COZAAR ) 25 MG tablet TAKE 1 TABLET(25 MG) BY MOUTH DAILY 90 tablet 3   metoprolol  succinate (TOPROL -XL) 50 MG 24 hr tablet TAKE 1 TABLET(50 MG) BY MOUTH DAILY WITH OR IMMEDIATELY FOLLOWING A MEAL 90 tablet 3   trospium  (SANCTURA ) 20 MG tablet Take 1 tablet (20 mg total) by mouth at bedtime. 90 tablet 3   vitamin C (ASCORBIC ACID ) 500 MG tablet Take 1,000 mg by mouth daily.     No current facility-administered medications on file prior to visit.    Review of Systems  Constitutional:  Negative for chills and fever.  Respiratory:  Negative for cough.   Cardiovascular:  Negative for chest pain and palpitations.  Gastrointestinal:  Positive for abdominal  distention and constipation. Negative for blood in stool, diarrhea, nausea and vomiting.      Objective:    BP 118/70   Pulse 90   Temp 98.3 F (36.8 C) (Oral)   Ht 5\' 3"  (1.6 m)   Wt 155 lb (70.3 kg)   SpO2 95%   BMI 27.46 kg/m  BP Readings from Last 3 Encounters:  01/08/24 118/70  11/11/23 118/74  09/06/23 136/78   Wt Readings from Last 3 Encounters:  01/08/24 155 lb (70.3 kg)  11/11/23 156 lb 6.4 oz (70.9 kg)  09/23/23 149 lb (67.6 kg)    Physical Exam Vitals reviewed.  Constitutional:      Appearance:  Normal appearance. She is well-developed.  Eyes:     Conjunctiva/sclera: Conjunctivae normal.  Cardiovascular:     Rate and Rhythm: Normal rate and regular rhythm.     Pulses: Normal pulses.     Heart sounds: Normal heart sounds.  Pulmonary:     Effort: Pulmonary effort is normal.     Breath sounds: Normal breath sounds. No wheezing, rhonchi or rales.  Abdominal:     General: Bowel sounds are normal. There is no distension.     Palpations: Abdomen is soft. Abdomen is not rigid. There is no fluid wave or mass.     Tenderness: There is no abdominal tenderness. There is no guarding or rebound.  Skin:    General: Skin is warm and dry.  Neurological:     Mental Status: She is alert.  Psychiatric:        Speech: Speech normal.        Behavior: Behavior normal.        Thought Content: Thought content normal.

## 2024-01-08 NOTE — Assessment & Plan Note (Signed)
 Reassuring exam.  Abdomen is soft. Crt cl 44 ml/min. Differential includes GERD, constipation.  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  Advised to be more consistent with MiraLAX  such as 2 or 3 days/week.  Start Pepcid  over-the-counter every other day due to CKD. Pending Xr ab.

## 2024-01-08 NOTE — Patient Instructions (Addendum)
 I suspect constipation playing a role.  Please schedule MiraLAX  2 or 3 days/week.   You may also consider starting Pepcid  AC EVERY OTHER DAY due to renal function for the next 7 to 10 days.  GERD in Adults: Diet Changes When you have gastroesophageal reflux disease (GERD), you may need to make changes to your diet. Choosing the right foods can help with your symptoms. Think about working with an expert in healthy eating called a dietitian. They can help you make healthy food choices. What are tips for following this plan? Reading food labels Look for foods that are low in saturated fat. Foods that may help with your symptoms include: Foods with less than 5% of daily value (DV) of fat. Foods with 0 grams of trans fat. Cooking [employer] in ways that don't use a lot of fat. These ways include: Baking. Steaming. Grilling. Broiling. To add flavor, try to use herbs that are low in spice and acidity. Avoid frying your food. Meal planning  Eat small meals often rather than eating 3 large meals each day. Eat your meals slowly in a place where you feel relaxed. If told by your health care provider, avoid: Foods that cause symptoms. Keep a food diary to keep track of foods that cause symptoms. Alcohol. Drinking a lot of liquid with meals. General instructions For 2-3 hours after you eat, avoid: Bending over. Exercise. Lying down. Chew sugar-free gum after meals. What foods should I eat? Eat a healthy diet. Try to include: Foods with high amounts of fiber. These include: Fruits and vegetables. Whole grains and beans. Low-fat dairy products. Lean meats, fish, and poultry. Egg whites. Foods that cause symptoms in someone else may not cause symptoms for you. Work with your provider to find foods that are safe for you. The items listed above may not be all the foods and drinks you can have. Talk with a dietitian to learn more. The items listed above may not be a complete list of foods  and beverages you can eat and drink. Contact a dietitian for more information. What foods should I avoid? Limiting some of these foods may help with your symptoms. Each person is different. Talk with a dietitian or your provider to help you find the exact foods to avoid. Some of the foods to avoid may include: Fruits Fruits with a lot of acid in them. These may include citrus fruits, such as oranges, grapefruit, pineapple, and lemons. Vegetables Deep-fried vegetables, such as [place] fries. Vegetables, sauces, or toppings made with added fat and vegetables with acid in them. These may include tomatoes and tomato products, chili peppers, onions, garlic, and horseradish. Grains Pastries or quick breads with added fat. Meats and other proteins High-fat meats, such as fatty beef or pork, hot dogs, ribs, ham, sausage, salami, and bacon. Fried meat or protein, such as fried fish and fried chicken. Egg yolks. Fats and oils Butter. Margarine. Shortening. Ghee. Drinks Coffee and other drinks with caffeine in them. Fizzy and sugary drinks, such as soda and energy drinks. Fruit juice made with acidic fruits, such as orange or grapefruit. Tomato juice. Sweets and desserts Chocolate and cocoa. Donuts. Seasonings and condiments Mint, such as peppermint and spearmint. Condiments, herbs, or seasonings that cause symptoms. These may include curry, hot sauce, or vinegar-based salad dressings. The items listed above may not be all the foods and drinks you should avoid. Talk with a dietitian to learn more. Questions to ask your health care provider Changes to  your diet and everyday life are often the first steps taken to manage symptoms of GERD. If these changes don't help, talk with your provider about taking medicines. Where to find more information International Foundation for Gastrointestinal Disorders: aboutgerd.org This information is not intended to replace advice given to you by your health care  provider. Make sure you discuss any questions you have with your health care provider. Document Revised: 06/18/2023 Document Reviewed: 01/02/2023 Elsevier Patient Education  2024 Elsevier Inc.  Osteopenia  Osteopenia is a loss of thickness (density) inside the bones. Another name for osteopenia is low bone mass. Mild osteopenia is a normal part of aging. It is not a disease, and it does not cause symptoms. However, if you have osteopenia and continue to lose bone mass, you could develop a condition that causes the bones to become thin and break more easily (osteoporosis). Osteoporosis can cause you to lose some height, have back pain, and have a stooped posture. Although osteopenia is not a disease, making changes to your lifestyle and diet can help to prevent osteopenia from developing into osteoporosis. What are the causes? Osteopenia is caused by loss of calcium  in the bones. Bones are constantly changing. Old bone cells are continually being replaced with new bone cells. This process builds new bone. The mineral calcium  is needed to build new bone and maintain bone density. Bone density is usually highest around age 76. After that, most people's bodies cannot replace all the bone they have lost with new bone. What increases the risk? You are more likely to develop this condition if: You are older than age 44. You are a woman who went through menopause early. You have a long illness that keeps you in bed. You do not get enough exercise. You lack certain nutrients (malnutrition). You have an overactive thyroid  gland (hyperthyroidism). You use products that contain nicotine or tobacco, such as cigarettes, e-cigarettes and chewing tobacco, or you drink a lot of alcohol. You are taking medicines that weaken the bones, such as steroids. What are the signs or symptoms? This condition does not cause any symptoms. You may have a slightly higher risk for bone breaks (fractures), so getting fractures  more easily than normal may be an indication of osteopenia. How is this diagnosed? This condition may be diagnosed based on an X-ray exam that measures bone density (dual-energy X-ray absorptiometry, or DEXA). This test can measure bone density in your hips, spine, and wrists. Osteopenia has no symptoms, so this condition is usually diagnosed after a routine bone density screening test is done for osteoporosis. This routine screening is usually done for: Women who are age 44 or older. Men who are age 49 or older. If you have risk factors for osteopenia, you may have the screening test at an earlier age. How is this treated? Making dietary and lifestyle changes can lower your risk for osteoporosis. If you have severe osteopenia that is close to becoming osteoporosis, this condition can be treated with medicines and dietary supplements such as calcium  and vitamin D . These supplements help to rebuild bone density. Follow these instructions at home: Eating and drinking Eat a diet that is high in calcium  and vitamin D . Calcium  is found in dairy products, beans, salmon, and leafy green vegetables like spinach and broccoli. Look for foods that have vitamin D  and calcium  added to them (fortified foods), such as orange juice, cereal, and bread.  Lifestyle Do 30 minutes or more of a weight-bearing exercise every day, such as  walking, jogging, or playing a sport. These types of exercises strengthen the bones. Do not use any products that contain nicotine or tobacco, such as cigarettes, e-cigarettes, and chewing tobacco. If you need help quitting, ask your health care provider. Do not drink alcohol if: Your health care provider tells you not to drink. You are pregnant, may be pregnant, or are planning to become pregnant. If you drink alcohol: Limit how much you use to: 0-1 drink a day for women. 0-2 drinks a day for men. Be aware of how much alcohol is in your drink. In the [place] one drink equals one  12 oz bottle of beer (355 mL), one 5 oz glass of wine (148 mL), or one 1 oz glass of hard liquor (44 mL). General instructions Take over-the-counter and prescription medicines only as told by your health care provider. These include vitamins and supplements. Take precautions at home to lower your risk of falling, such as: Keeping rooms well-lit and free of clutter, such as cords. Installing safety rails on stairs. Using rubber mats in the bathroom or other areas that are often wet or slippery. Keep all follow-up visits. This is important. Contact a health care provider if: You have not had a bone density screening for osteoporosis and you are: A woman who is age 66 or older. A man who is age 89 or older. You are a postmenopausal woman who has not had a bone density screening for osteoporosis. You are older than age 44 and you want to know if you should have bone density screening for osteoporosis. Summary Osteopenia is a loss of thickness (density) inside the bones. Another name for osteopenia is low bone mass. Osteopenia is not a disease, but it may increase your risk for a condition that causes the bones to become thin and break more easily (osteoporosis). You may be at risk for osteopenia if you are older than age 47 or if you are a woman who went through early menopause. Osteopenia does not cause any symptoms, but it can be diagnosed with a bone density screening test. Dietary and lifestyle changes are the first treatment for osteopenia. These may lower your risk for osteoporosis. This information is not intended to replace advice given to you by your health care provider. Make sure you discuss any questions you have with your health care provider. Document Revised: 04/23/2023 Document Reviewed: 04/23/2023 Elsevier Patient Education  2024 [employer].

## 2024-01-09 ENCOUNTER — Other Ambulatory Visit: Payer: Self-pay | Admitting: Family

## 2024-01-09 ENCOUNTER — Ambulatory Visit: Payer: Self-pay | Admitting: Family

## 2024-01-09 DIAGNOSIS — I10 Essential (primary) hypertension: Secondary | ICD-10-CM

## 2024-01-30 ENCOUNTER — Telehealth: Payer: Self-pay | Admitting: Family

## 2024-01-30 DIAGNOSIS — Z87898 Personal history of other specified conditions: Secondary | ICD-10-CM

## 2024-01-30 NOTE — Telephone Encounter (Signed)
 Copied from CRM [id number]. Topic: Referral - Request for Referral >> Jan 30, 2024 10:17 AM Lajean Pike wrote: Did the patient discuss referral with their provider in the last year? Yes (If No - schedule appointment) (If Yes - send message)  Appointment offered? No  Type of order/referral and detailed reason for visit: Dietician  Preference of office, provider, location: [place] within the area  If referral order, have you been seen by this specialty before? No (If Yes, this issue or another issue? When? Where?  Can we respond through MyChart? Yes or call and leave a message on home telephone.

## 2024-01-31 ENCOUNTER — Other Ambulatory Visit: Payer: Self-pay | Admitting: Family

## 2024-01-31 DIAGNOSIS — I10 Essential (primary) hypertension: Secondary | ICD-10-CM

## 2024-02-03 NOTE — Telephone Encounter (Signed)
 Pt has been notified.

## 2024-02-03 NOTE — Telephone Encounter (Signed)
 Call pt Referral to nutritionist placed Let us  know if you dont hear back within a week or two in regards to an appointment being scheduled.

## 2024-02-03 NOTE — Addendum Note (Signed)
 Addended by: Calista Catching on: 02/03/2024 10:38 AM   Modules accepted: Orders

## 2024-02-07 ENCOUNTER — Ambulatory Visit: Payer: Self-pay | Admitting: *Deleted

## 2024-02-07 ENCOUNTER — Other Ambulatory Visit
Admission: RE | Admit: 2024-02-07 | Discharge: 2024-02-07 | Disposition: A | Discharge status: Home / Self Care | Source: Ambulatory Visit | Attending: Student | Admitting: Student

## 2024-02-07 DIAGNOSIS — M546 Pain in thoracic spine: Secondary | ICD-10-CM | POA: Insufficient documentation

## 2024-02-07 LAB — D-DIMER, QUANTITATIVE: D-Dimer, Quant: 0.7 ug{FEU}/mL — ABNORMAL HIGH (ref 0.00–0.50)

## 2024-02-07 NOTE — Telephone Encounter (Signed)
 Spoke to pt daughter and pt is at ED as we speak

## 2024-02-07 NOTE — Telephone Encounter (Signed)
 Spoke to pt she is going to the ED

## 2024-02-07 NOTE — Telephone Encounter (Signed)
      FYI Only or Action Required?: FYI only for provider.  Patient was last seen in primary care on 01/08/2024 by Calista Catching, FNP. Called Nurse Triage reporting Pain. Symptoms began several days ago. Interventions attempted: OTC medications: tylenol  and no relief. Symptoms are: gradually worsening.  Triage Disposition: See HCP Within 4 Hours (Or PCP Triage)  Patient/caregiver understands and will follow disposition?: Yes                    Copied from CRM [id number]. Topic: Clinical - Red Word Triage >> Feb 07, 2024  8:06 AM Kita Perish H wrote: [facility] that prompted transfer to Nurse Triage: Pain in side, on right side towards the back Reason for Disposition  [1] SEVERE back pain (e.g., excruciating, unable to do any normal activities) AND [2] not improved 2 hours after pain medicine  Answer Assessment - Initial Assessment Questions 1. ONSET: When did the pain begin?      1  1/2 days  2. LOCATION: Where does it hurt? (upper, mid or lower back)     Right flank and back  3. SEVERITY: How bad is the pain?  (e.g., Scale 1-10; mild, moderate, or severe)   - MILD (1-3): Doesn't interfere with normal activities.    - MODERATE (4-7): Interferes with normal activities or awakens from sleep.    - SEVERE (8-10): Excruciating pain, unable to do any normal activities.      8/10 sharp  4. PATTERN: Is the pain constant? (e.g., yes, no; constant, intermittent)      Steady  5. RADIATION: Does the pain shoot into your legs or somewhere else?     No  6. CAUSE:  What do you think is causing the back pain?      Not sure hx kidney issues 7. BACK OVERUSE:  Any recent lifting of heavy objects, strenuous work or exercise?     no 8. MEDICINES: What have you taken so far for the pain? (e.g., nothing, acetaminophen , NSAIDS)     Tylenol  with no relief 9. NEUROLOGIC SYMPTOMS: Do you have any weakness, numbness, or problems with bowel/bladder control?     Denies  10.  OTHER SYMPTOMS: Do you have any other symptoms? (e.g., fever, abdomen pain, burning with urination, blood in urine)       Right flank/ back pain sharp and worse with bending and not sleeping well 11. PREGNANCY: Is there any chance you are pregnant? When was your last menstrual period?       Na   No available appt with PCP or other providers today . Recommended UC . Patient with hx kidney issues but no complaint with urinary issues at this time.  Protocols used: Back Pain-A-AH

## 2024-02-26 ENCOUNTER — Encounter: Payer: Self-pay | Admitting: Family

## 2024-02-26 ENCOUNTER — Ambulatory Visit: Admitting: Family

## 2024-02-26 ENCOUNTER — Telehealth: Payer: Self-pay

## 2024-02-26 VITALS — BP 130/70 | HR 79 | Temp 98.5°F | Resp 20 | Ht 63.0 in | Wt 152.1 lb

## 2024-02-26 DIAGNOSIS — R1031 Right lower quadrant pain: Secondary | ICD-10-CM | POA: Diagnosis not present

## 2024-02-26 DIAGNOSIS — R7303 Prediabetes: Secondary | ICD-10-CM

## 2024-02-26 DIAGNOSIS — K5900 Constipation, unspecified: Secondary | ICD-10-CM | POA: Diagnosis not present

## 2024-02-26 LAB — CBC WITH DIFFERENTIAL/PLATELET
Basophils Absolute: 0.1 K/uL (ref 0.0–0.1)
Basophils Relative: 0.5 % (ref 0.0–3.0)
Eosinophils Absolute: 0 K/uL (ref 0.0–0.7)
Eosinophils Relative: 0.5 % (ref 0.0–5.0)
HCT: 38.6 % (ref 36.0–46.0)
Hemoglobin: 12.5 g/dL (ref 12.0–15.0)
Lymphocytes Relative: 35.7 % (ref 12.0–46.0)
Lymphs Abs: 3.6 K/uL (ref 0.7–4.0)
MCHC: 32.4 g/dL (ref 30.0–36.0)
MCV: 88 fl (ref 78.0–100.0)
Monocytes Absolute: 0.9 K/uL (ref 0.1–1.0)
Monocytes Relative: 9.1 % (ref 3.0–12.0)
Neutro Abs: 5.5 K/uL (ref 1.4–7.7)
Neutrophils Relative %: 54.2 % (ref 43.0–77.0)
Platelets: 325 K/uL (ref 150.0–400.0)
RBC: 4.38 Mil/uL (ref 3.87–5.11)
RDW: 13.4 % (ref 11.5–15.5)
WBC: 10.2 K/uL (ref 4.0–10.5)

## 2024-02-26 LAB — URINALYSIS, ROUTINE W REFLEX MICROSCOPIC
Bilirubin Urine: NEGATIVE
Hgb urine dipstick: NEGATIVE
Leukocytes,Ua: NEGATIVE
Nitrite: NEGATIVE
RBC / HPF: NONE SEEN (ref 0–?)
Specific Gravity, Urine: 1.025 (ref 1.000–1.030)
Urine Glucose: NEGATIVE
Urobilinogen, UA: 0.2 (ref 0.0–1.0)
pH: 6 (ref 5.0–8.0)

## 2024-02-26 LAB — HEMOGLOBIN A1C: Hgb A1c MFr Bld: 6.5 % (ref 4.6–6.5)

## 2024-02-26 NOTE — Patient Instructions (Signed)
 Referral to physical therapy  Let us  know if you dont hear back within a week in regards to an appointment being scheduled.   So that you are aware, if you are [facility] MyChart user , please pay attention to your MyChart messages as you may receive a MyChart message with a phone number to call and schedule this test/appointment own your own from our referral coordinator. This is a new process so I do not want you to miss this message.  If you are not a MyChart user, you will receive a phone call.

## 2024-02-26 NOTE — Progress Notes (Signed)
 Assessment & Plan:  Right groin pain Assessment & Plan: Right low back pain has improved; question if r/t UTI. Groin pain raises concern for hip etiology. Pending thoracic, lumbar and pelvic xray Referral to PT for gait evaluation, strength training.  Continue Tylenol  arthritis.  She is avoiding NSAIDs due to CKD.  Close follow up  Orders: -     Ambulatory referral to Physical Therapy -     DG Thoracic Spine W/Swimmers; Future -     DG Lumbar Spine Complete; Future -     DG HIPS BILAT W OR W/O PELVIS 3-4 VIEWS; Future -     Urinalysis, Routine w reflex microscopic -     Urine Culture -     CBC with Differential/Platelet  Prediabetes -     Hemoglobin A1c  Constipation, unspecified constipation type Assessment & Plan: Improved with daily miralax . Will monitor.       Return precautions given.   Risks, benefits, and alternatives of the medications and treatment plan prescribed today were discussed, and patient expressed understanding.   Education regarding symptom management and diagnosis given to patient on AVS either electronically or printed.  Return in about 2 months (around 04/28/2024).  Rollene Northern, FNP  Subjective:    Patient ID: Donna Ferguson, female    DOB: Dec 01, 1940, 83 y.o.   MRN: [id number]  CC: Donna Ferguson is a 83 y.o. female who presents today for follow up.   HPI: She complains of right groin pain  when 'she sleeps on my belly' She related to bowel resection as timeline correlates with surgery.  Groin pain is not worse and it is episodic. Aggravated by sleeping on her abdomen and at times walking. No numbness in legs, saddle anesthesia, urinary or bowel incontinence    She is now taking miralax  daily whereas she has been taking miralax  every few day. This has been helpful for daily formed BM.  She is not taking taking Pepcid  nor metamucil.   Urine is no longer pungent. Low back has resolved.   Follow-up urgent care visit 02/07/2024 at prior  [facility] clinic UC for right low back pain Abdominal x-ray moderate stool burden.  No evidence of renal stone Xr right rib no displaced rib fracture. Clear lungs WBC 11.5 GFR 50 Started on vantin for suspected UTI Urine culture with Klebsiella pneumoniae, pansensitive Advised add Metamucil and fiber.  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  Current Outpatient Medications on File Prior to Visit  Medication Sig Dispense Refill   acetaminophen  (TYLENOL ) 650 MG CR tablet Take 650 mg by mouth daily.     amLODipine  (NORVASC ) 5 MG tablet TAKE 1 TABLET(5 MG) BY MOUTH EVERY EVENING 90 tablet 3   Calcium  Citrate-Vitamin D  (CALCIUM  CITRATE + PO) Take 400 mg by mouth daily. Take 1 pill 1 day then the next days she takes 2 pills     cholecalciferol  (VITAMIN D3) 25 MCG (1000 UNIT) tablet Take 1,000 Units by mouth daily.     losartan  (COZAAR ) 25 MG tablet TAKE 1 TABLET(25 MG) BY MOUTH DAILY 90 tablet 3   metoprolol  succinate (TOPROL -XL) 50 MG 24 hr tablet TAKE 1 TABLET(50 MG) BY MOUTH DAILY WITH OR IMMEDIATELY FOLLOWING A MEAL 90 tablet 3   Multiple Vitamins-Minerals (CENTRUM SILVER 50+WOMEN) TABS Take 1 tablet by mouth daily.     trospium  (SANCTURA ) 20 MG tablet Take 1 tablet (20 mg total) by  mouth at bedtime. 90 tablet 3   vitamin C (ASCORBIC ACID ) 500 MG tablet Take 1,000 mg by mouth daily.     No current facility-administered medications on file prior to visit.    Review of Systems  Constitutional:  Negative for chills and fever.  Respiratory:  Negative for cough.   Cardiovascular:  Negative for chest pain and palpitations.  Gastrointestinal:  Negative for constipation (resolved), nausea and vomiting.  Genitourinary:  Positive for pelvic pain (right groin). Negative for dysuria and hematuria.  Musculoskeletal:  Positive for arthralgias. Negative for back pain.   Neurological:  Negative for numbness.      Objective:    BP 130/70   Pulse 79   Temp 98.5 F (36.9 C)   Resp 20   Ht 5' 3 (1.6 m)   Wt 152 lb 2 oz (69 kg)   SpO2 98%   BMI 26.95 kg/m  BP Readings from Last 3 Encounters:  02/26/24 130/70  01/08/24 118/70  11/11/23 118/74   Wt Readings from Last 3 Encounters:  02/26/24 152 lb 2 oz (69 kg)  01/08/24 155 lb (70.3 kg)  11/11/23 156 lb 6.4 oz (70.9 kg)    Physical Exam Vitals reviewed.  Constitutional:      Appearance: Normal appearance. She is well-developed.  Eyes:     Conjunctiva/sclera: Conjunctivae normal.  Cardiovascular:     Rate and Rhythm: Normal rate and regular rhythm.     Pulses: Normal pulses.     Heart sounds: Normal heart sounds.  Pulmonary:     Effort: Pulmonary effort is normal.     Breath sounds: Normal breath sounds. No wheezing, rhonchi or rales.  Abdominal:     General: Bowel sounds are normal. There is no distension.     Palpations: Abdomen is soft. Abdomen is not rigid. There is no fluid wave or mass.     Tenderness: There is no abdominal tenderness. There is no guarding or rebound.  Musculoskeletal:     Lumbar back: No swelling, edema, spasms, tenderness or bony tenderness. Normal range of motion.     Comments: Full range of motion with flexion, tension, lateral side bends. No bony tenderness. No pain, numbness, tingling elicited with single leg raise bilaterally.  Right Hip: No limp or waddling gait. Full ROM with flexion and hip rotation in flexion.    No pain of lateral hip with  (flexion-abduction-external rotation) test.   No pain with deep palpation of greater trochanter.     Skin:    General: Skin is warm and dry.  Neurological:     Mental Status: She is alert.     Sensory: No sensory deficit.     Deep Tendon Reflexes:     Reflex Scores:      Patellar reflexes are 2+ on the right side and 2+ on the left side.    Comments: Sensation  intact bilateral lower extremities Strength  4/5  bilateral lower extremities.    Psychiatric:        Speech: Speech normal.        Behavior: Behavior normal.        Thought Content: Thought content normal.

## 2024-02-26 NOTE — Telephone Encounter (Signed)
 Patient is scheduled with PCP today at 1:00 Pm. Patient notified and verbalized understanding.

## 2024-02-26 NOTE — Telephone Encounter (Signed)
-----   Message from [facility] sent at 02/25/2024  6:52 PM EDT ----- Regarding: Schedule Patient is scheduled to see us  on 02/28/24 at 9 AM. Patient was last seen by PCP on 01/08/24 and was recommended to f/u in 2 months but she does not have any follow up with PCP. She really needs to see her PCP instead of me for follow up from 01/08/24 and UTI follow up from 02/07/24. It's not just ED follow up! Please help in having this patient see her PCP (this week or next week is okay).  Thank you,  Luke Shade, MD

## 2024-02-26 NOTE — Assessment & Plan Note (Addendum)
 Right low back pain has improved; question if r/t UTI. Groin pain raises concern for hip etiology. Pending thoracic, lumbar and pelvic xray Referral to PT for gait evaluation, strength training.  Continue Tylenol  arthritis.  She is avoiding NSAIDs due to CKD.  Close follow up

## 2024-02-26 NOTE — Assessment & Plan Note (Signed)
 Improved with daily miralax . Will monitor.

## 2024-02-27 LAB — URINE CULTURE
MICRO NUMBER:: 16676799
Result:: NO GROWTH
SPECIMEN QUALITY:: ADEQUATE

## 2024-02-28 ENCOUNTER — Inpatient Hospital Stay

## 2024-02-28 ENCOUNTER — Telehealth: Payer: Self-pay | Admitting: Family

## 2024-02-28 DIAGNOSIS — R899 Unspecified abnormal finding in specimens from other organs, systems and tissues: Secondary | ICD-10-CM

## 2024-02-28 NOTE — Telephone Encounter (Signed)
 Can we add on urine protein to urine collected this wk?

## 2024-03-02 ENCOUNTER — Ambulatory Visit (INDEPENDENT_AMBULATORY_CARE_PROVIDER_SITE_OTHER)

## 2024-03-02 ENCOUNTER — Ambulatory Visit: Payer: Self-pay | Admitting: Family

## 2024-03-02 ENCOUNTER — Other Ambulatory Visit

## 2024-03-02 DIAGNOSIS — R1031 Right lower quadrant pain: Secondary | ICD-10-CM

## 2024-03-02 DIAGNOSIS — R899 Unspecified abnormal finding in specimens from other organs, systems and tissues: Secondary | ICD-10-CM

## 2024-03-02 NOTE — Telephone Encounter (Signed)
 Additional urine test can not be added after 24 hours of collection.

## 2024-03-03 ENCOUNTER — Encounter: Attending: Family | Admitting: Dietician

## 2024-03-03 ENCOUNTER — Encounter: Payer: Self-pay | Admitting: Dietician

## 2024-03-03 DIAGNOSIS — R7303 Prediabetes: Secondary | ICD-10-CM | POA: Insufficient documentation

## 2024-03-03 DIAGNOSIS — I129 Hypertensive chronic kidney disease with stage 1 through stage 4 chronic kidney disease, or unspecified chronic kidney disease: Secondary | ICD-10-CM | POA: Diagnosis not present

## 2024-03-03 DIAGNOSIS — N183 Chronic kidney disease, stage 3 unspecified: Secondary | ICD-10-CM | POA: Diagnosis not present

## 2024-03-03 DIAGNOSIS — K589 Irritable 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 diarrhea: Secondary | ICD-10-CM

## 2024-03-03 NOTE — Progress Notes (Signed)
 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 Pt believes it is a result of what they are eating. Pt reports fear of eating due to unpredictability of symptoms. Pt reports recent bout of severe constipation that required UC visit, has been taking Miralax  daily since and has seen relief. Pt reports logging foods and symptoms more recently, has not found a cause as of yet. Pt reports previously liking to use garlic, onions, tomatoes while cooking, states they recently cut out these foods and saw an improvement in bloating/gas. Pt reports trying to start juicing, states juices don't cause GI symptom, will have small portions of juice once or twice daily.   Estimated daily fluid intake: 48 oz Supplements: Daily MVI, Calcium , Vit D, Miralax  Sleep: Poor, previously worked 3rd shift, frequent urination during the night Stress / self-care: Low Current average weekly physical activity: ADLs, walks at home  24-Hr Dietary Recall First Meal: Apple/carrot/beet juice, Bagel, hot green tea Snack:  Second Meal: Broiled chicken, okra, baguette Snack: 4 oz. juice Third Meal:  Snack:  Beverages: Juice, Green tea,   NUTRITION DIAGNOSIS  [place]-1.4 Altered GI function As related to  bowel resection.  As evidenced by frequent bouts of gas/bloating, intermittent diarrhea/constipation not experienced before surgery.   NUTRITION INTERVENTION  Nutrition education (E-1) on the following topics:  Educated patient on changes in absorption associated with ileum resection. Educated patient on FODMAP foods and their role in gas/bloating/diarrhea. Educated patient on adequate fiber and water intake for bowel regularity. Educated patient on role of consistent physical activity in bowel regularity.   Handouts Provided Include  Low FODMAP Nutrition Therpay NIH Nutrient Absorption Article  Learning Style & Readiness for Change Teaching method utilized: Visual & Auditory  Demonstrated degree of understanding via: Teach Back  Barriers to learning/adherence to lifestyle change: None  Goals Established by Pt Lower your consumption of coconut water to 1 8 oz serving per day, and work towards drinking 4 bottles (64 oz/2 liters) of plain water daily! Continue to log your food choices and symptoms as they occur. Compare your food choices to your FODMAP food list to look for patterns and/or symptoms with any particular group of foods. Try blending your fruits instead of juicing to get the fiber from them. If this is not tolerated, go back to juicing and try to add a small amount of pulp to your juice, as tolerated Consider adding a daily probiotic supplement (Nature Made, [place], Nature's [place]) Try adding servings of egg whites with your breakfast and cook with a plant oil spray (olive, avocado, canola). Consider sipping on a protein shake through out the afternoon to add extra protein to your diet. Try Premier Protein, Protein Plus, or Fairlife Protein for high protein, low sugar options!   MONITORING & EVALUATION Dietary intake, weekly physical activity, and GI symptoms in 4-6 weeks.  Next Steps  Patient is to log foods/symptoms, contact RD if needed, follow up as scheduled.

## 2024-03-03 NOTE — Telephone Encounter (Signed)
 noted

## 2024-03-03 NOTE — Addendum Note (Signed)
 Addended by: Arul Farabee on: 03/03/2024 03:31 PM   Modules accepted: Orders

## 2024-03-03 NOTE — Patient Instructions (Addendum)
 Lower your consumption of coconut water to 1 8 oz serving per day, and work towards drinking 4 bottles (64 oz/2 liters) of plain water daily!  Continue to log your food choices and symptoms as they occur. Compare your food choices to your FODMAP food list to look for patterns and/or symptoms with any particular group of foods.  Try blending your fruits instead of juicing to get the fiber from them. If this is not tolerated, go back to juicing and try to add a small amount of pulp to your juice , as tolerated  Consider adding a daily probiotic supplement (Nature Made, [place], Nature's [place])  Try adding servings of egg whites with your breakfast and cook with a plant oil spray (olive, avocado, canola).  Consider sipping on a protein shake through out the afternoon to add extra protein to your diet. Try Premier Protein, Protein Plus, or Fairlife Protein for high protein, low sugar options!

## 2024-03-05 NOTE — Telephone Encounter (Unsigned)
 Copied from CRM [id number]. Topic: Clinical - Lab/Test Results >> Mar 05, 2024  9:47 AM Robinson H wrote: Reason for CRM: Patient returning call to office regarding lab results, please reach back out.  Gracilyn [phone]

## 2024-03-05 NOTE — Addendum Note (Signed)
 Addended by: Magdalyn Arenivas on: 03/05/2024 10:13 AM   Modules accepted: Orders

## 2024-03-17 ENCOUNTER — Other Ambulatory Visit: Payer: Self-pay | Admitting: Family

## 2024-03-17 DIAGNOSIS — M545 Low back pain, unspecified: Secondary | ICD-10-CM

## 2024-03-24 ENCOUNTER — Other Ambulatory Visit

## 2024-03-25 ENCOUNTER — Other Ambulatory Visit (INDEPENDENT_AMBULATORY_CARE_PROVIDER_SITE_OTHER)

## 2024-03-25 DIAGNOSIS — R899 Unspecified abnormal finding in specimens from other organs, systems and tissues: Secondary | ICD-10-CM | POA: Diagnosis not present

## 2024-03-25 LAB — MICROALBUMIN / CREATININE URINE RATIO
Creatinine,U: 98.7 mg/dL
Microalb Creat Ratio: 9.8 mg/g (ref 0.0–30.0)
Microalb, Ur: 1 mg/dL (ref 0.0–1.9)

## 2024-03-25 LAB — URINALYSIS, ROUTINE W REFLEX MICROSCOPIC
Bilirubin Urine: NEGATIVE
Hgb urine dipstick: NEGATIVE
Ketones, ur: NEGATIVE
Leukocytes,Ua: NEGATIVE
Nitrite: NEGATIVE
RBC / HPF: NONE SEEN (ref 0–?)
Specific Gravity, Urine: 1.01 (ref 1.000–1.030)
Total Protein, Urine: NEGATIVE
Urine Glucose: NEGATIVE
Urobilinogen, UA: 0.2 (ref 0.0–1.0)
pH: 6 (ref 5.0–8.0)

## 2024-03-29 ENCOUNTER — Ambulatory Visit: Payer: Self-pay | Admitting: Family

## 2024-04-08 ENCOUNTER — Telehealth: Payer: Self-pay | Admitting: Family

## 2024-04-08 NOTE — Telephone Encounter (Signed)
 Called pt and she was able to get an appointment for next Tuesday for PT.

## 2024-04-08 NOTE — Telephone Encounter (Signed)
 Copied from CRM #[id number]. Topic: Referral - Status >> Apr 08, 2024  9:45 AM Maisie BROCKS wrote: Reason for CRM: pt called and stated that she was told from Margaret's nurse to call the office if she hadn't heard back from the rehabilitation center her PT referral was sent to. I suggested that she calls them directly and I provided her with the contact info. I also assured her that I would still send a message to cma. Please advise.

## 2024-04-14 ENCOUNTER — Ambulatory Visit: Attending: Family

## 2024-04-14 DIAGNOSIS — M5459 Other low back pain: Secondary | ICD-10-CM | POA: Diagnosis present

## 2024-04-14 DIAGNOSIS — M25552 Pain in left hip: Secondary | ICD-10-CM | POA: Diagnosis present

## 2024-04-14 DIAGNOSIS — R1031 Right lower quadrant pain: Secondary | ICD-10-CM | POA: Insufficient documentation

## 2024-04-14 DIAGNOSIS — M25551 Pain in right hip: Secondary | ICD-10-CM | POA: Insufficient documentation

## 2024-04-14 NOTE — Therapy (Signed)
 OUTPATIENT PHYSICAL THERAPY EVALUATION  Patient Name: Donna Ferguson MRN: [id number] DOB:05-13-1941, 83 y.o., female Today's Date: 04/14/2024  END OF SESSION:  PT End of Session - 04/14/24 1021     Visit Number 1    Number of Visits 16    Date for PT Re-Evaluation 06/09/24    Authorization Type Medicare    Authorization Time Period 04/14/24-06/09/24    Progress Note Due on Visit 10    PT Start Time 1015    PT Stop Time 1055    PT Time Calculation (min) 40 min    Equipment Utilized During Treatment Gait belt    Activity Tolerance Patient tolerated treatment well;No increased pain    Behavior During Therapy WFL for tasks assessed/performed          Past Medical History:  Diagnosis Date   Aortic atherosclerosis (HCC)    Arthritis    Chronic kidney disease, stage 3a (HCC)    Constipation    Diastolic dysfunction 01/04/2020   a.) TTE 01/04/2020: EF 60-65%, mild LVH, triv MR, mild-mod AoV sclerosis without stenosis, G1DD.   Diverticulosis    Dyspnea    Heart murmur    History of colonoscopy    2019 in [place]; patient states no longer screening for colon cancer.   Hypertension    IBS (irritable bowel syndrome)    Lower back pain    Pneumonia    PONV (postoperative nausea and vomiting)    Pre-diabetes    Renal cyst    Right leg DVT (HCC)    Past Surgical History:  Procedure Laterality Date   ABDOMINAL HYSTERECTOMY     unsure if has ovaries.    BOWEL RESECTION N/A 10/02/2022   Procedure: SMALL BOWEL RESECTION, open, possible colectomy, RNFA to assist;  Surgeon: Jordis Laneta FALCON, MD;  Location: [facility] ORS;  Service: General;  Laterality: N/A;   BREAST CYST EXCISION Bilateral    BREAST EXCISIONAL BIOPSY Bilateral late 80s, early 90s    benign   BREAST SURGERY     COLONOSCOPY     COLONOSCOPY WITH PROPOFOL  N/A 05/31/2022   Procedure: COLONOSCOPY WITH PROPOFOL ;  Surgeon: Unk Corinn Skiff, MD;  Location: [facility] ENDOSCOPY;  Service: Gastroenterology;  Laterality: N/A;   JOINT  REPLACEMENT     REPLACEMENT TOTAL KNEE Left 2017   TOTAL KNEE ARTHROPLASTY Right 12/28/2021   Procedure: TOTAL KNEE ARTHROPLASTY;  Surgeon: Edie Norleen PARAS, MD;  Location: [facility] ORS;  Service: Orthopedics;  Laterality: Right;   Patient Active Problem List   Diagnosis Date Noted   Right groin pain 02/26/2024   Osteopenia 01/08/2024   Goals of care, counseling/discussion 10/16/2022   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   Mural thickening of colon    Polyp of ascending colon    Polyp of descending colon    Abnormal CT scan, colon 05/01/2022   Epigastric pain 04/02/2022   Status post total knee replacement using cement, 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   Lymphadenopathy 12/05/2021   Chronic kidney disease, stage 3a (HCC) 11/29/2021   Irritable 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 diarrhea 11/29/2021   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   LLQ pain 11/17/2021   OAB (overactive bladder) 12/07/2020   Dysuria 10/10/2020   Peripheral neuropathy 08/05/2020   Hand pain 08/05/2020   Rash 01/13/2020   Constipation 01/01/2020   Renal cyst 01/01/2020   Liver cyst 01/01/2020   Low back pain 01/01/2020   Abdominal pain 12/08/2019   HTN (hypertension) 10/28/2019  Prediabetes 10/28/2019   Right knee pain 10/28/2019   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   Nuclear sclerotic cataract of both eyes 10/15/2019   Bilateral knee pain 05/29/2019    PCP: Dineen Rollene MATSU, FNP  REFERRING PROVIDER: Dineen Rollene MATSU, FNP  REFERRING DIAG: groin pain, back pain   Rationale for Evaluation and Treatment: Rehabilitation  THERAPY DIAG:  Pain of both hip joints  Other low back pain  ONSET DATE: Feb 2024   SUBJECTIVE:                                                                                                                                                                                            SUBJECTIVE STATEMENT: Pt has lingering soreness and pain that is affecting her sleep and QOL- its not going away.  PERTINENT HISTORY:  83yoF originally from [place], then [place] until 5 years ago presents with persistent pain in bilat hips, groins, low back that started abotu 18 months prior following a partial colectomy. Imaging reassuring shows arthritis. Pt no longer able to sleep in prone as she always has. Pain worse in morning, as bad a 8/10, but improves within 1 hour of waking, much improved with walking. PT used to walk daily in [place], but lack of sidewalks here have cramped her style. Pt denies any franke changes to strength or balance but does notice some intermittent weaving at times- no falls.   PAIN:  Are you having pain? None   PRECAUTIONS: None   WEIGHT BEARING RESTRICTIONS: No   FALLS:  Has patient fallen in last 6 months? No   PATIENT GOALS: Improve postural tolerance in bed, reduce waking pain.   OBJECTIVE:  Note: Objective measures were completed at Evaluation unless otherwise noted.  DIAGNOSTIC FINDINGS:  Arthritis   PATIENT SURVEYS:  MODI: 44%   POSTURE: great   TESTS:  3-sec chair rise: 9x  SCREENING TOOLS:   Leg crossing for socks (limited bilat, Rt > Lt)   2 hands to floor, tight in low back only, not hips   LOWER EXTREMITY ROM:      Right eval Left eval  Hip flexion 115 130  Hip internal rotation 22 35  Hip external rotation 35 53    TREATMENT DATE 04/14/24 :                                                                                                                               -  hooklying FABER stretch 2xz30secH  -seated lumbar flexion stretch 2x30sec  -STS from chair x9, STS from plinth x10 -prone press up x10  -trunk rotation stretch in chair 1x30sec bilat  -MODI survey: 44%   PATIENT EDUCATION:  Education details: HEP and role of daily walking  Person educated: Lorisa  Education method: Research scientist (medical), deliberate  practice, positive reinforcement, explicit instruction, establish rules. Education comprehension: good   HOME EXERCISE PROGRAM: Access Code: [email or web address] URL: [email or web address] Date: 04/14/2024 Prepared by: Peggye Linear  Exercises - Supine Figure 4 Piriformis Stretch  - 2 x daily - 2 reps - 30 hold - Seated Flexion Stretch  - 1 x daily - 2 reps - 30 hold - Seated Trunk Rotation Stretch  - 2 x daily - 2 reps - 30 hold - Prone Press Up  - 2 x daily - 1 sets - 10 reps - Sit to Stand with Arms Crossed  - 2 x daily - 3 sets - 10 reps  ASSESSMENT:  CLINICAL IMPRESSION: 83yoF evaluated for ongoing pain since her surgery last year that has limited her ability to be active and mobile as per her baseline. Pt has experienced lost sleep and a decline in quality of life, with new secondary changes to stability during gait. Patient will benefit from skilled physical therapy intervention to reduce deficits and impairments identified in evaluation, in order to reduce pain, improve quality of life, and maximize activity tolerance for ADL, IADL, and leisure/fitness. Physical therapy will help pt achieve long and short term goals of care.   OBJECTIVE IMPAIRMENTS: Decreased knowledge of condition, decreased use of DME, decreased mobility, difficulty walking, decreased strength, decreased ROM. ACTIVITY LIMITATIONS: Lifting, standing, walking, squatting, transfers, locomotion level PARTICIPATION LIMITATIONS: Cleaning, laundry, interpersonal relationships, driving, yardwork, community activity.  PERSONAL FACTORS: Age, behavior pattern, education, past/current experiences, transportation, profession  are also affecting patient's functional outcome.  REHAB POTENTIAL: Good CLINICAL DECISION MAKING: Medium  EVALUATION COMPLEXITY: Moderate   GOALS: Goals reviewed with patient? No   SHORT TERM GOALS: Target date: 05/15/24  Worst pain in most recent 2 weeks <6/10.  Baseline: 8/10 at eval Goal  status: INITIAL  2.  Pt to report consistent HEP performance with improvement in general flexibility and reduces RLE stiffness.  Baseline:  Goal status: INITIAL  LONG TERM GOALS: Target date:   Pt to report decrease incidence in waking pain to <3days/week in most recent week.  Baseline:  Goal status: INITIAL  2.  Pt confident in long term maintenance program for hip/lumbar pain control and strength.  Baseline:  Goal status: INITIAL  3.  Improved MODI score by >15%  Baseline: eval: 44%  Goal status: INITIAL  4.  Pt to report ability to walk 1 mile for exercise 2-3x weekly without exacerbation of or limitation by pain.  Baseline:  Goal status: INITIAL  PLAN:  PT FREQUENCY: 1-2x/week  PT DURATION: 8 weeks   PLANNED INTERVENTIONS: 97110-Therapeutic exercises, 97530- Therapeutic activity, [id number]- Neuromuscular re-education, 97535- Self Care, [id number]- Manual therapy, [phone]- Gait training, [phone]- Electrical stimulation (unattended), [phone]- Electrical stimulation (manual), Patient/Family education, Balance training, Stair training, Joint mobilization, Joint manipulation, Cryotherapy, and Moist heat.  PLAN FOR NEXT SESSION:  Review HEP and symptom response. Trial LLE long axis distraction, progress core strengthening for analgesic effect and load tolerance   2:42 PM, 04/14/24 Peggye JAYSON Linear, PT, DPT Physical Therapist - [facility]  Outpatient Physical Therapy- Main Campus [phone]     Mia Winthrop C, PT 04/14/2024, 10:28 AM

## 2024-04-21 ENCOUNTER — Ambulatory Visit: Attending: Family

## 2024-04-21 ENCOUNTER — Encounter: Payer: Self-pay | Admitting: Dietician

## 2024-04-21 ENCOUNTER — Encounter: Attending: Family | Admitting: Dietician

## 2024-04-21 DIAGNOSIS — N183 Chronic kidney disease, stage 3 unspecified: Secondary | ICD-10-CM | POA: Diagnosis present

## 2024-04-21 DIAGNOSIS — K589 Irritable 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 diarrhea: Secondary | ICD-10-CM | POA: Insufficient documentation

## 2024-04-21 DIAGNOSIS — R7303 Prediabetes: Secondary | ICD-10-CM | POA: Diagnosis not present

## 2024-04-21 DIAGNOSIS — K219 Gastro-esophageal reflux disease without esophagitis: Secondary | ICD-10-CM | POA: Diagnosis not present

## 2024-04-21 DIAGNOSIS — M25551 Pain in right hip: Secondary | ICD-10-CM | POA: Diagnosis present

## 2024-04-21 DIAGNOSIS — I129 Hypertensive chronic kidney disease with stage 1 through stage 4 chronic kidney disease, or unspecified chronic kidney disease: Secondary | ICD-10-CM | POA: Diagnosis not present

## 2024-04-21 DIAGNOSIS — M5459 Other low back pain: Secondary | ICD-10-CM | POA: Insufficient documentation

## 2024-04-21 DIAGNOSIS — M25552 Pain in left hip: Secondary | ICD-10-CM | POA: Insufficient documentation

## 2024-04-21 DIAGNOSIS — Z713 Dietary counseling and surveillance: Secondary | ICD-10-CM | POA: Diagnosis not present

## 2024-04-21 DIAGNOSIS — Z87898 Personal history of other specified conditions: Secondary | ICD-10-CM | POA: Diagnosis present

## 2024-04-21 NOTE — Therapy (Signed)
 OUTPATIENT PHYSICAL THERAPY TREATMENT  Patient Name: Donna Ferguson MRN: [id number] DOB:1940/09/19, 83 y.o., female Today's Date: 04/21/2024  END OF SESSION:  PT End of Session - 04/21/24 0851     Visit Number 2    Number of Visits 16    Date for PT Re-Evaluation 06/09/24    Authorization Type Medicare    Authorization Time Period 04/14/24-06/09/24    Progress Note Due on Visit 10    PT Start Time 0847    PT Stop Time 0927    PT Time Calculation (min) 40 min    Activity Tolerance Patient tolerated treatment well;No increased pain    Behavior During Therapy WFL for tasks assessed/performed          Past Medical History:  Diagnosis Date   Aortic atherosclerosis (HCC)    Arthritis    Chronic kidney disease, stage 3a (HCC)    Constipation    Diastolic dysfunction 01/04/2020   a.) TTE 01/04/2020: EF 60-65%, mild LVH, triv MR, mild-mod AoV sclerosis without stenosis, G1DD.   Diverticulosis    Dyspnea    Heart murmur    History of colonoscopy    2019 in [place]; patient states no longer screening for colon cancer.   Hypertension    IBS (irritable bowel syndrome)    Lower back pain    Pneumonia    PONV (postoperative nausea and vomiting)    Pre-diabetes    Renal cyst    Right leg DVT (HCC)    Past Surgical History:  Procedure Laterality Date   ABDOMINAL HYSTERECTOMY     unsure if has ovaries.    BOWEL RESECTION N/A 10/02/2022   Procedure: SMALL BOWEL RESECTION, open, possible colectomy, RNFA to assist;  Surgeon: Jordis Laneta FALCON, MD;  Location: [facility];  Service: General;  Laterality: N/A;   BREAST CYST EXCISION Bilateral    BREAST EXCISIONAL BIOPSY Bilateral late 80s, early 90s    benign   BREAST SURGERY     COLONOSCOPY     COLONOSCOPY WITH PROPOFOL  N/A 05/31/2022   Procedure: COLONOSCOPY WITH PROPOFOL ;  Surgeon: Unk Corinn Skiff, MD;  Location: [facility];  Service: Gastroenterology;  Laterality: N/A;   JOINT REPLACEMENT     REPLACEMENT TOTAL KNEE Left 2017    TOTAL KNEE ARTHROPLASTY Right 12/28/2021   Procedure: TOTAL KNEE ARTHROPLASTY;  Surgeon: Edie Norleen PARAS, MD;  Location: [facility];  Service: Orthopedics;  Laterality: Right;   Patient Active Problem List   Diagnosis Date Noted   Right groin pain 02/26/2024   Osteopenia 01/08/2024   Goals of care, counseling/discussion 10/16/2022   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   Mural thickening of colon    Polyp of ascending colon    Polyp of descending colon    Abnormal CT scan, colon 05/01/2022   Epigastric pain 04/02/2022   Status post total knee replacement using cement, 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   Lymphadenopathy 12/05/2021   Chronic kidney disease, stage 3a (HCC) 11/29/2021   Irritable 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 diarrhea 11/29/2021   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   LLQ pain 11/17/2021   OAB (overactive bladder) 12/07/2020   Dysuria 10/10/2020   Peripheral neuropathy 08/05/2020   Hand pain 08/05/2020   Rash 01/13/2020   Constipation 01/01/2020   Renal cyst 01/01/2020   Liver cyst 01/01/2020   Low back pain 01/01/2020   Abdominal pain 12/08/2019   HTN (hypertension) 10/28/2019   Prediabetes 10/28/2019   Right knee pain 10/28/2019  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   Nuclear sclerotic cataract of both eyes 10/15/2019   Bilateral knee pain 05/29/2019    PCP: Dineen Rollene MATSU, FNP  REFERRING PROVIDER: Dineen Rollene MATSU, FNP  REFERRING DIAG: groin pain, back pain   Rationale for Evaluation and Treatment: Rehabilitation  THERAPY DIAG:  Pain of both hip joints  Other low back pain  ONSET DATE: Feb 2024   SUBJECTIVE:                                                                                                                                                                                           SUBJECTIVE STATEMENT: Pt has lingering soreness and pain that  is affecting her sleep and QOL- its not going away. Pt reports her husband fell off a ladder this weekend and broke his tailbone, but is doing ok.   PERTINENT HISTORY:  83yoF originally from [place], then [place] until 5 years ago presents with persistent pain in bilat hips, groins, low back that started abotu 18 months prior following a partial colectomy. Imaging reassuring shows arthritis. Pt no longer able to sleep in prone as she always has. Pain worse in morning, as bad a 8/10, but improves within 1 hour of waking, much improved with walking. PT used to walk daily in [place], but lack of sidewalks here have cramped her style. Pt denies any franke changes to strength or balance but does notice some intermittent weaving at times- no falls.   PAIN:  Are you having pain? None   PRECAUTIONS: None   WEIGHT BEARING RESTRICTIONS: No   FALLS:  Has patient fallen in last 6 months? No   PATIENT GOALS: Improve postural tolerance in bed, reduce waking pain.   OBJECTIVE:  Note: Objective measures were completed at Evaluation unless otherwise noted.   TREATMENT DATE 04/21/24 :                                                                                                                               -AA/ROM bilat hips using arms to  assist: Nustep, seat 6, arms 6, level 2, cues for mount/dismount, setup and SPM >60 -Plinth assisted hip/lumbar flexion stretch 1x60sec -hooklying marching on plinth 1x30, alternating legs  -Plinth assisted hip/lumbar flexion stretch 1x60sec (dark green physio ball between knees)  -hooklying marching on plinth 1x30, alternating legs  -Plinth assisted hip/lumbar flexion stretch 1x60sec (dark green physio ball between knees) -RLE LAD c belt 1x60sec -hooklying bridge 1x12 -RLE LAD c belt 1x60sec -hooklying bridge 1x12 *flip to prone  -RT hip anterior glide in shoulder width grade IV, III x60sec (twice)  -lateral stepping bilat c 2lb AW and yellow TB at knees  -seated GTB  clams x15  -lateral stepping bilat c 2lb AW and yellow TB at knees  -seated GTB clams x15    PATIENT EDUCATION:  Education details: HEP and role of daily walking  Person educated: [name]  Education method: Research scientist (medical), deliberate practice, positive reinforcement, explicit instruction, establish rules. Education comprehension: good   HOME EXERCISE PROGRAM: Access Code: [email or web address] URL: [email or web address] Date: 04/14/2024 Prepared by: Peggye Linear  Exercises - Supine Figure 4 Piriformis Stretch  - 2 x daily - 2 reps - 30 hold - Seated Flexion Stretch  - 1 x daily - 2 reps - 30 hold - Seated Trunk Rotation Stretch  - 2 x daily - 2 reps - 30 hold - Prone Press Up  - 2 x daily - 1 sets - 10 reps - Sit to Stand with Arms Crossed  - 2 x daily - 3 sets - 10 reps  ASSESSMENT:  CLINICAL IMPRESSION: HEP well tolerated and compliant.  Expanded on ROM program today, including anterior glides and long axis distraction. Patient will benefit from skilled physical therapy intervention to reduce deficits and impairments identified in evaluation, in order to reduce pain, improve quality of life, and maximize activity tolerance for ADL, IADL, and leisure/fitness. Physical therapy will help pt achieve long and short term goals of care.   OBJECTIVE IMPAIRMENTS: Decreased knowledge of condition, decreased use of DME, decreased mobility, difficulty walking, decreased strength, decreased ROM. ACTIVITY LIMITATIONS: Lifting, standing, walking, squatting, transfers, locomotion level PARTICIPATION LIMITATIONS: Cleaning, laundry, interpersonal relationships, driving, yardwork, community activity.  PERSONAL FACTORS: Age, behavior pattern, education, past/current experiences, transportation, profession  are also affecting patient's functional outcome.  REHAB POTENTIAL: Good CLINICAL DECISION MAKING: Medium  EVALUATION COMPLEXITY: Moderate   GOALS: Goals reviewed with patient? No    SHORT TERM GOALS: Target date: 05/15/24  Worst pain in most recent 2 weeks <6/10.  Baseline: 8/10 at eval Goal status: INITIAL  2.  Pt to report consistent HEP performance with improvement in general flexibility and reduces RLE stiffness.  Baseline:  Goal status: INITIAL  LONG TERM GOALS: Target date:   Pt to report decrease incidence in waking pain to <3days/week in most recent week.  Baseline:  Goal status: INITIAL  2.  Pt confident in long term maintenance program for hip/lumbar pain control and strength.  Baseline:  Goal status: INITIAL  3.  Improved MODI score by >15%  Baseline: eval: 44%  Goal status: INITIAL  4.  Pt to report ability to walk 1 mile for exercise 2-3x weekly without exacerbation of or limitation by pain.  Baseline:  Goal status: INITIAL  PLAN:  PT FREQUENCY: 1-2x/week  PT DURATION: 8 weeks   PLANNED INTERVENTIONS: 97110-Therapeutic exercises, 97530- Therapeutic activity, [id number]- Neuromuscular re-education, 97535- Self Care, 02859- Manual therapy, [phone]- Gait training, [phone]- Electrical stimulation (unattended), [phone]- Electrical stimulation (manual), Patient/Family education, Balance training, Stair training, Joint  mobilization, Joint manipulation, Cryotherapy, and Moist heat.  PLAN FOR NEXT SESSION:  Repeat manual therpaies as helpful, hip strengthening as tolerated  8:53 AM, 04/21/24 Peggye JAYSON Linear, PT, DPT Physical Therapist - [facility]  Outpatient Physical Therapy- Main Campus [phone]     Jamarie Mussa C, PT 04/21/2024, 8:53 AM

## 2024-04-21 NOTE — Progress Notes (Signed)
 Medical Nutrition Therapy  Appointment Start time:  [phone]  Appointment End time:  0915  Primary concerns today: Diarrhea/Constipation  Referral diagnosis: Z87.898 - History of prediabetes, N18.30 - CKD S3 Preferred learning style: Auditory, Visual Learning readiness: Change in progress   NUTRITION ASSESSMENT    Clinical Medical Hx: Prediabetes, CKD S3, HTN, IBS, Small bowel cancer/resection, GERD Medications: Amlodipine , Losartan , Metoprolol  Labs: A1c - 6.5%, Creatinine - 1.04, eGFR - 54 NEW (03/25/2024): Urine microalbumin - 1.0 (In range), Urine creatinine - 98.7 (in range) Notable Signs/Symptoms: Diarrhea/Constipation   Lifestyle & Dietary Hx Pt report less diarrhea, more firm stools and better regularity since lat visit, reports feeling much better overall.  Pt reports they have stopped juicing, and added in a protein shake (vegan protein powder, and cold water) instead, states this has been well tolerated and has helped to increase energy level. Pt reports eating yogurt regularly, and started taking a daily probiotic. Pt reports switching to mint tea from green tea, states it has helped to reduce GI upset. Pt reports logging foods and symptoms. Pt reports a few foods causing symptoms: split pea soup (caused diarrhea), ripe bananas (caused reflux), shrimp (gas/rumbling), almond milk (rumbling), vinegar (gas), stevia (diarrhea), collard/mustard/spinach greens (rumbling). Pt reports taking Beano to try to reduce gas/stomach rumblings, states it usually helps with moderate instances but not more severe instances. Pt reports starting PT for arthritis in their hip, will be going 2x a week for 6 weeks. Pt reports starting to walk for 30 minutes for 5 days a week in their yard.    Estimated daily fluid intake: 48 oz Supplements: Daily MVI, Calcium , Vit D, Miralax  Sleep: Poor, previously worked 3rd shift, frequent urination during the night Stress / self-care: Low Current average weekly  physical activity: ADLs, walks 5 days for 30 minutes, PT 2x weekly   24-Hr Dietary Recall First Meal: 1/2 bagel, small piece of cheese, 1/2 apple turnover, mint tea Snack: Protein shake Second Meal: Chicken, mashed potatoes, okra Snack:  Third Meal:  Snack:  Beverages: Mint tea, water, protein shake   NUTRITION DIAGNOSIS  [place]-1.4 Altered GI function As related to bowel resection.  As evidenced by frequent bouts of gas/bloating, intermittent diarrhea/constipation not experienced before surgery.   NUTRITION INTERVENTION  Nutrition education (E-1) on the following topics:  Educated patient on changes in absorption associated with ileum resection. Educated patient on FODMAP foods and their role in gas/bloating/diarrhea. Educated patient on adequate fiber and water intake for bowel regularity. Educated patient on role of consistent physical activity in bowel regularity.   Handouts Provided Include  Low FODMAP Nutrition Therpay NIH Nutrient Absorption Article  Learning Style & Readiness for Change Teaching method utilized: Visual & Auditory  Demonstrated degree of understanding via: Teach Back  Barriers to learning/adherence to lifestyle change: None  Goals Established by Pt Try having a small serving of your pomegranate juice. If tolerated, have a small glass! Try having swiss chard or [employer] as choices for your cooked greens. You can try collards or mustard greens without vinegar to reassess your tolerance. Try going to the [employer] or [employer] in [place] for a great selection of seafood.   MONITORING & EVALUATION Dietary intake, weekly physical activity, and GI symptoms in 2 months  Next Steps  Patient is to log foods/symptoms, contact RD if needed, follow up as scheduled.

## 2024-04-21 NOTE — Patient Instructions (Addendum)
 Try having a small serving of your pomegranate juice. If tolerated, have a small glass!  Try having swiss chard or [employer] as choices for your cooked greens. You can try collards or mustard greens without vinegar to reassess your tolerance.  Try going to the [employer] or [employer] in [place] for a great selection of seafood.

## 2024-04-23 ENCOUNTER — Ambulatory Visit

## 2024-04-23 DIAGNOSIS — M25551 Pain in right hip: Secondary | ICD-10-CM | POA: Diagnosis not present

## 2024-04-23 DIAGNOSIS — M5459 Other low back pain: Secondary | ICD-10-CM

## 2024-04-23 NOTE — Therapy (Signed)
 OUTPATIENT PHYSICAL THERAPY TREATMENT  Patient Name: Donna Ferguson MRN: [id number] DOB:Dec 16, 1940, 83 y.o., female Today's Date: 04/23/2024  END OF SESSION:  PT End of Session - 04/23/24 1408     Visit Number 3    Number of Visits 16    Date for PT Re-Evaluation 06/09/24    Authorization Type Medicare    Authorization Time Period 04/14/24-06/09/24    Progress Note Due on Visit 10    PT Start Time 1400    PT Stop Time 1440    PT Time Calculation (min) 40 min    Equipment Utilized During Treatment Gait belt    Activity Tolerance Patient tolerated treatment well;No increased pain    Behavior During Therapy WFL for tasks assessed/performed          Past Medical History:  Diagnosis Date   Aortic atherosclerosis (HCC)    Arthritis    Chronic kidney disease, stage 3a (HCC)    Constipation    Diastolic dysfunction 01/04/2020   a.) TTE 01/04/2020: EF 60-65%, mild LVH, triv MR, mild-mod AoV sclerosis without stenosis, G1DD.   Diverticulosis    Dyspnea    Heart murmur    History of colonoscopy    2019 in [place]; patient states no longer screening for colon cancer.   Hypertension    IBS (irritable bowel syndrome)    Lower back pain    Pneumonia    PONV (postoperative nausea and vomiting)    Pre-diabetes    Renal cyst    Right leg DVT (HCC)    Past Surgical History:  Procedure Laterality Date   ABDOMINAL HYSTERECTOMY     unsure if has ovaries.    BOWEL RESECTION N/A 10/02/2022   Procedure: SMALL BOWEL RESECTION, open, possible colectomy, RNFA to assist;  Surgeon: Jordis Laneta FALCON, MD;  Location: [facility];  Service: General;  Laterality: N/A;   BREAST CYST EXCISION Bilateral    BREAST EXCISIONAL BIOPSY Bilateral late 80s, early 90s    benign   BREAST SURGERY     COLONOSCOPY     COLONOSCOPY WITH PROPOFOL  N/A 05/31/2022   Procedure: COLONOSCOPY WITH PROPOFOL ;  Surgeon: Unk Corinn Skiff, MD;  Location: [facility];  Service: Gastroenterology;  Laterality: N/A;   JOINT  REPLACEMENT     REPLACEMENT TOTAL KNEE Left 2017   TOTAL KNEE ARTHROPLASTY Right 12/28/2021   Procedure: TOTAL KNEE ARTHROPLASTY;  Surgeon: Edie Norleen PARAS, MD;  Location: [facility];  Service: Orthopedics;  Laterality: Right;   Patient Active Problem List   Diagnosis Date Noted   Right groin pain 02/26/2024   Osteopenia 01/08/2024   Goals of care, counseling/discussion 10/16/2022   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   Mural thickening of colon    Polyp of ascending colon    Polyp of descending colon    Abnormal CT scan, colon 05/01/2022   Epigastric pain 04/02/2022   Status post total knee replacement using cement, 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   Lymphadenopathy 12/05/2021   Chronic kidney disease, stage 3a (HCC) 11/29/2021   Irritable 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 diarrhea 11/29/2021   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   LLQ pain 11/17/2021   OAB (overactive bladder) 12/07/2020   Dysuria 10/10/2020   Peripheral neuropathy 08/05/2020   Hand pain 08/05/2020   Rash 01/13/2020   Constipation 01/01/2020   Renal cyst 01/01/2020   Liver cyst 01/01/2020   Low back pain 01/01/2020   Abdominal pain 12/08/2019   HTN (hypertension) 10/28/2019  Prediabetes 10/28/2019   Right knee pain 10/28/2019   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   Nuclear sclerotic cataract of both eyes 10/15/2019   Bilateral knee pain 05/29/2019    PCP: Dineen Rollene MATSU, FNP  REFERRING PROVIDER: Dineen Rollene MATSU, FNP  REFERRING DIAG: groin pain, back pain   Rationale for Evaluation and Treatment: Rehabilitation  THERAPY DIAG:  Pain of both hip joints  Other low back pain  ONSET DATE: Feb 2024   SUBJECTIVE:                                                                                                                                                                                            SUBJECTIVE STATEMENT: Pt feels good today and after last session. Pt had no pain upon waking this morning for the first time in a while. Pt went for a walk this morning 8:30-9.   PERTINENT HISTORY:  83yoF originally from [place], then [place] until 5 years ago presents with persistent pain in bilat hips, groins, low back that started abotu 18 months prior following a partial colectomy. Imaging reassuring shows arthritis. Pt no longer able to sleep in prone as she always has. Pain worse in morning, as bad a 8/10, but improves within 1 hour of waking, much improved with walking. PT used to walk daily in [place], but lack of sidewalks here have cramped her style. Pt denies any franke changes to strength or balance but does notice some intermittent weaving at times- no falls.   PAIN:  Are you having pain? None   PRECAUTIONS: None   WEIGHT BEARING RESTRICTIONS: No   FALLS:  Has patient fallen in last 6 months? No   PATIENT GOALS: Improve postural tolerance in bed, reduce waking pain.   OBJECTIVE:  Note: Objective measures were completed at evaluation unless otherwise noted.   TREATMENT DATE 04/23/24 :                                                                                                                               -  AA/ROM bilat hips using arms to assist: Nustep, seat 7, arms 7, level 2, cues for mount/dismount, setup and SPM >60 -95cm physiosphere flexion rollouts x25  -hooklying marching on plinth 1x30, alternating legs  -LTR x20 -hooklying bridge with ball squeeze x12  -hooklying clam with green ball foot spacer x15 (RTB)  -hooklying marching on plinth 1x30, alternating legs  -RLE LAD c belt 1x60sec  -hooklying marching on plinth 1x30, alternating legs  -LTR x20 -hooklying bridge with ball squeeze x12  -hooklying clam with green ball foot spacer x15 (RTB)  -hooklying marching on plinth 1x30, alternating legs  -RLE LAD c belt 1x60sec  *flip to prone  -RT hip anterior glide  in shoulder width grade IV, III x30sec  -lateral stepping bilat c RTB at knees 1x35ft bilat  -RT hip anterior glide in shoulder width grade IV, III x30sec  -lateral stepping bilat c GTB at knees 1x46ft bilat    PATIENT EDUCATION:  Education details: HEP and role of daily walking  Person educated: Donna Ferguson  Education method: Research scientist (medical), deliberate practice, positive reinforcement, explicit instruction, establish rules. Education comprehension: good   HOME EXERCISE PROGRAM: Access Code: [email or web address] URL: [email or web address] Date: 04/14/2024 Prepared by: Peggye Linear  Exercises - Supine Figure 4 Piriformis Stretch  - 2 x daily - 2 reps - 30 hold - Seated Flexion Stretch  - 1 x daily - 2 reps - 30 hold - Seated Trunk Rotation Stretch  - 2 x daily - 2 reps - 30 hold - Prone Press Up  - 2 x daily - 1 sets - 10 reps - Sit to Stand with Arms Crossed  - 2 x daily - 3 sets - 10 reps  ASSESSMENT:  CLINICAL IMPRESSION: HEP well tolerated and compliant.  Expanded on ROM program today, including anterior glides and long axis distraction. Patient will benefit from skilled physical therapy intervention to reduce deficits and impairments identified in evaluation, in order to reduce pain, improve quality of life, and maximize activity tolerance for ADL, IADL, and leisure/fitness. Physical therapy will help pt achieve long and short term goals of care.   OBJECTIVE IMPAIRMENTS: Decreased knowledge of condition, decreased use of DME, decreased mobility, difficulty walking, decreased strength, decreased ROM. ACTIVITY LIMITATIONS: Lifting, standing, walking, squatting, transfers, locomotion level PARTICIPATION LIMITATIONS: Cleaning, laundry, interpersonal relationships, driving, yardwork, community activity.  PERSONAL FACTORS: Age, behavior pattern, education, past/current experiences, transportation, profession  are also affecting patient's functional outcome.  REHAB POTENTIAL:  Good CLINICAL DECISION MAKING: Medium  EVALUATION COMPLEXITY: Moderate   GOALS: Goals reviewed with patient? No   SHORT TERM GOALS: Target date: 05/15/24  Worst pain in most recent 2 weeks <6/10.  Baseline: 8/10 at eval Goal status: INITIAL  2.  Pt to report consistent HEP performance with improvement in general flexibility and reduces RLE stiffness.  Baseline:  Goal status: INITIAL  LONG TERM GOALS: Target date:   Pt to report decrease incidence in waking pain to <3days/week in most recent week.  Baseline:  Goal status: INITIAL  2.  Pt confident in long term maintenance program for hip/lumbar pain control and strength.  Baseline:  Goal status: INITIAL  3.  Improved MODI score by >15%  Baseline: eval: 44%  Goal status: INITIAL  4.  Pt to report ability to walk 1 mile for exercise 2-3x weekly without exacerbation of or limitation by pain.  Baseline:  Goal status: INITIAL  PLAN:  PT FREQUENCY: 1-2x/week  PT DURATION: 8 weeks   PLANNED INTERVENTIONS: 97110-Therapeutic exercises, 97530- Therapeutic  activity, [id number]- Neuromuscular re-education, [phone]- Self Care, 02859- Manual therapy, [phone]- Gait training, [phone]- Electrical stimulation (unattended), [phone]- Electrical stimulation (manual), Patient/Family education, Balance training, Stair training, Joint mobilization, Joint manipulation, Cryotherapy, and Moist heat.  PLAN FOR NEXT SESSION:  Repeat manual therpaies as helpful, hip strengthening as tolerated  2:09 PM, 04/23/24 Peggye JAYSON Linear, PT, DPT Physical Therapist - [facility]  Outpatient Physical Therapy- Main Campus [phone]     [place] C, PT 04/23/2024, 2:09 PM

## 2024-04-28 ENCOUNTER — Ambulatory Visit

## 2024-04-28 DIAGNOSIS — M25551 Pain in right hip: Secondary | ICD-10-CM

## 2024-04-28 DIAGNOSIS — M5459 Other low back pain: Secondary | ICD-10-CM

## 2024-04-28 NOTE — Therapy (Signed)
 OUTPATIENT PHYSICAL THERAPY TREATMENT  Patient Name: Donna Ferguson MRN: [id number] DOB:1941-06-27, 83 y.o., female Today's Date: 04/28/2024  END OF SESSION:  PT End of Session - 04/28/24 1022     Visit Number 4    Number of Visits 16    Date for PT Re-Evaluation 06/09/24    Authorization Type Medicare    Authorization Time Period 04/14/24-06/09/24    Progress Note Due on Visit 10    PT Start Time 1018    Equipment Utilized During Treatment Gait belt    Activity Tolerance Patient tolerated treatment well;No increased pain    Behavior During Therapy WFL for tasks assessed/performed          Past Medical History:  Diagnosis Date   Aortic atherosclerosis (HCC)    Arthritis    Chronic kidney disease, stage 3a (HCC)    Constipation    Diastolic dysfunction 01/04/2020   a.) TTE 01/04/2020: EF 60-65%, mild LVH, triv MR, mild-mod AoV sclerosis without stenosis, G1DD.   Diverticulosis    Dyspnea    Heart murmur    History of colonoscopy    2019 in [place]; patient states no longer screening for colon cancer.   Hypertension    IBS (irritable bowel syndrome)    Lower back pain    Pneumonia    PONV (postoperative nausea and vomiting)    Pre-diabetes    Renal cyst    Right leg DVT (HCC)    Past Surgical History:  Procedure Laterality Date   ABDOMINAL HYSTERECTOMY     unsure if has ovaries.    BOWEL RESECTION N/A 10/02/2022   Procedure: SMALL BOWEL RESECTION, open, possible colectomy, RNFA to assist;  Surgeon: Jordis Laneta FALCON, MD;  Location: [facility];  Service: General;  Laterality: N/A;   BREAST CYST EXCISION Bilateral    BREAST EXCISIONAL BIOPSY Bilateral late 80s, early 90s    benign   BREAST SURGERY     COLONOSCOPY     COLONOSCOPY WITH PROPOFOL  N/A 05/31/2022   Procedure: COLONOSCOPY WITH PROPOFOL ;  Surgeon: Unk Corinn Skiff, MD;  Location: [facility];  Service: Gastroenterology;  Laterality: N/A;   JOINT REPLACEMENT     REPLACEMENT TOTAL KNEE Left 2017   TOTAL KNEE  ARTHROPLASTY Right 12/28/2021   Procedure: TOTAL KNEE ARTHROPLASTY;  Surgeon: Edie Norleen PARAS, MD;  Location: [facility];  Service: Orthopedics;  Laterality: Right;   Patient Active Problem List   Diagnosis Date Noted   Right groin pain 02/26/2024   Osteopenia 01/08/2024   Goals of care, counseling/discussion 10/16/2022   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   Mural thickening of colon    Polyp of ascending colon    Polyp of descending colon    Abnormal CT scan, colon 05/01/2022   Epigastric pain 04/02/2022   Status post total knee replacement using cement, 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   Lymphadenopathy 12/05/2021   Chronic kidney disease, stage 3a (HCC) 11/29/2021   Irritable 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 diarrhea 11/29/2021   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   LLQ pain 11/17/2021   OAB (overactive bladder) 12/07/2020   Dysuria 10/10/2020   Peripheral neuropathy 08/05/2020   Hand pain 08/05/2020   Rash 01/13/2020   Constipation 01/01/2020   Renal cyst 01/01/2020   Liver cyst 01/01/2020   Low back pain 01/01/2020   Abdominal pain 12/08/2019   HTN (hypertension) 10/28/2019   Prediabetes 10/28/2019   Right knee pain 10/28/2019   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   Nuclear sclerotic cataract of both eyes 10/15/2019   Bilateral knee pain 05/29/2019    PCP: Dineen Rollene MATSU, FNP  REFERRING PROVIDER: Dineen Rollene MATSU, FNP  REFERRING DIAG: groin pain, back pain   Rationale for Evaluation and Treatment: Rehabilitation  THERAPY DIAG:  Pain of both hip joints  Other low back pain  ONSET DATE: Feb 2024   SUBJECTIVE:                                                                                                                                                                                           SUBJECTIVE STATEMENT: Pt reports feeling a little stiff  PERTINENT  HISTORY:  83yoF originally from [place], then [place] until 5 years ago presents with persistent pain in bilat hips, groins, low back that started abotu 18 months prior following a partial colectomy. Imaging reassuring shows arthritis. Pt no longer able to sleep in prone as she always has. Pain worse in morning, as bad a 8/10, but improves within 1 hour of waking, much improved with walking. PT used to walk daily in [place], but lack of sidewalks here have cramped her style. Pt denies any franke changes to strength or balance but does notice some intermittent weaving at times- no falls.   PAIN:  Are you having pain? None   PRECAUTIONS: None   WEIGHT BEARING RESTRICTIONS: No   FALLS:  Has patient fallen in last 6 months? No   PATIENT GOALS: Improve postural tolerance in bed, reduce waking pain.   OBJECTIVE:  Note: Objective measures were completed at evaluation unless otherwise noted.   TREATMENT DATE 04/28/24                                                                                                                                   -hooklying marching on plinth 1x12 with Resistance- RTB- ea LE  -LTR x20 x 2 sets  (VC to perform slowly)  -Supine hooklying Hip mobs IR/ER (feet width of mat) x 20 reps (no pain) -hooklying bridge with  ball squeeze x12  -hooklying clam with x15 (RTB)  -hooklying reverse clamshell x 15 ea LE --85cm blue physioball lumbar flexion rollouts x25; 10 more biased to right; 10 toward the left.    Manual Therapy:  -RLE LAD c belt 1x60sec      PATIENT EDUCATION:  Education details: HEP and role of daily walking  Person educated: Mikala  Education method: Research scientist (medical), deliberate practice, positive reinforcement, explicit instruction, establish rules. Education comprehension: good   HOME EXERCISE PROGRAM: Access Code: [email or web address] URL: [email or web address] Date: 04/14/2024 Prepared by: Peggye Linear  Exercises - Supine Figure 4  Piriformis Stretch  - 2 x daily - 2 reps - 30 hold - Seated Flexion Stretch  - 1 x daily - 2 reps - 30 hold - Seated Trunk Rotation Stretch  - 2 x daily - 2 reps - 30 hold - Prone Press Up  - 2 x daily - 1 sets - 10 reps - Sit to Stand with Arms Crossed  - 2 x daily - 3 sets - 10 reps  ASSESSMENT:  CLINICAL IMPRESSION:  Patient will benefit from skilled physical therapy intervention to reduce deficits and impairments identified in evaluation, in order to reduce pain, improve quality of life, and maximize activity tolerance for ADL, IADL, and leisure/fitness. Physical therapy will help pt achieve long and short term goals of care.   OBJECTIVE IMPAIRMENTS: Decreased knowledge of condition, decreased use of DME, decreased mobility, difficulty walking, decreased strength, decreased ROM. ACTIVITY LIMITATIONS: Lifting, standing, walking, squatting, transfers, locomotion level PARTICIPATION LIMITATIONS: Cleaning, laundry, interpersonal relationships, driving, yardwork, community activity.  PERSONAL FACTORS: Age, behavior pattern, education, past/current experiences, transportation, profession  are also affecting patient's functional outcome.  REHAB POTENTIAL: Good CLINICAL DECISION MAKING: Medium  EVALUATION COMPLEXITY: Moderate   GOALS: Goals reviewed with patient? No   SHORT TERM GOALS: Target date: 05/15/24  Worst pain in most recent 2 weeks <6/10.  Baseline: 8/10 at eval Goal status: INITIAL  2.  Pt to report consistent HEP performance with improvement in general flexibility and reduces RLE stiffness.  Baseline:  Goal status: INITIAL  LONG TERM GOALS: Target date:   Pt to report decrease incidence in waking pain to <3days/week in most recent week.  Baseline:  Goal status: INITIAL  2.  Pt confident in long term maintenance program for hip/lumbar pain control and strength.  Baseline:  Goal status: INITIAL  3.  Improved MODI score by >15%  Baseline: eval: 44%  Goal status:  INITIAL  4.  Pt to report ability to walk 1 mile for exercise 2-3x weekly without exacerbation of or limitation by pain.  Baseline:  Goal status: INITIAL  PLAN:  PT FREQUENCY: 1-2x/week  PT DURATION: 8 weeks   PLANNED INTERVENTIONS: 97110-Therapeutic exercises, 97530- Therapeutic activity, [id number]- Neuromuscular re-education, 97535- Self Care, [id number]- Manual therapy, [phone]- Gait training, [phone]- Electrical stimulation (unattended), [phone]- Electrical stimulation (manual), Patient/Family education, Balance training, Stair training, Joint mobilization, Joint manipulation, Cryotherapy, and Moist heat.  PLAN FOR NEXT SESSION:  Repeat manual therpaies as helpful, hip strengthening as tolerated  10:25 AM, 04/28/24 Chyrl London, PT Physical Therapist - [facility]  Outpatient Physical Therapy- [facility] [phone]     Reyes LOISE London, PT 04/28/2024, 10:25 AM

## 2024-04-30 ENCOUNTER — Ambulatory Visit

## 2024-04-30 DIAGNOSIS — M25551 Pain in right hip: Secondary | ICD-10-CM | POA: Diagnosis not present

## 2024-04-30 DIAGNOSIS — M5459 Other low back pain: Secondary | ICD-10-CM

## 2024-04-30 NOTE — Therapy (Signed)
 OUTPATIENT PHYSICAL THERAPY TREATMENT  Patient Name: Donna Ferguson MRN: [id number] DOB:04-26-1941, 83 y.o., female Today's Date: 04/30/2024  END OF SESSION:  PT End of Session - 04/30/24 1310     Visit Number 5    Number of Visits 16    Date for PT Re-Evaluation 06/09/24    Authorization Type Medicare    Authorization Time Period 04/14/24-06/09/24    Progress Note Due on Visit 10    PT Start Time 1315    PT Stop Time 1359    PT Time Calculation (min) 44 min    Equipment Utilized During Treatment Gait belt    Activity Tolerance Patient tolerated treatment well;No increased pain    Behavior During Therapy WFL for tasks assessed/performed          Past Medical History:  Diagnosis Date   Aortic atherosclerosis (HCC)    Arthritis    Chronic kidney disease, stage 3a (HCC)    Constipation    Diastolic dysfunction 01/04/2020   a.) TTE 01/04/2020: EF 60-65%, mild LVH, triv MR, mild-mod AoV sclerosis without stenosis, G1DD.   Diverticulosis    Dyspnea    Heart murmur    History of colonoscopy    2019 in [place]; patient states no longer screening for colon cancer.   Hypertension    IBS (irritable bowel syndrome)    Lower back pain    Pneumonia    PONV (postoperative nausea and vomiting)    Pre-diabetes    Renal cyst    Right leg DVT (HCC)    Past Surgical History:  Procedure Laterality Date   ABDOMINAL HYSTERECTOMY     unsure if has ovaries.    BOWEL RESECTION N/A 10/02/2022   Procedure: SMALL BOWEL RESECTION, open, possible colectomy, RNFA to assist;  Surgeon: Donna Laneta FALCON, MD;  Location: [facility];  Service: General;  Laterality: N/A;   BREAST CYST EXCISION Bilateral    BREAST EXCISIONAL BIOPSY Bilateral late 80s, early 90s    benign   BREAST SURGERY     COLONOSCOPY     COLONOSCOPY WITH PROPOFOL  N/A 05/31/2022   Procedure: COLONOSCOPY WITH PROPOFOL ;  Surgeon: Donna Corinn Skiff, MD;  Location: [facility];  Service: Gastroenterology;  Laterality: N/A;   JOINT  REPLACEMENT     REPLACEMENT TOTAL KNEE Left 2017   TOTAL KNEE ARTHROPLASTY Right 12/28/2021   Procedure: TOTAL KNEE ARTHROPLASTY;  Surgeon: Donna Norleen PARAS, MD;  Location: [facility];  Service: Orthopedics;  Laterality: Right;   Patient Active Problem List   Diagnosis Date Noted   Right groin pain 02/26/2024   Osteopenia 01/08/2024   Goals of care, counseling/discussion 10/16/2022   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   Mural thickening of colon    Polyp of ascending colon    Polyp of descending colon    Abnormal CT scan, colon 05/01/2022   Epigastric pain 04/02/2022   Status post total knee replacement using cement, 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   Lymphadenopathy 12/05/2021   Chronic kidney disease, stage 3a (HCC) 11/29/2021   Irritable 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 diarrhea 11/29/2021   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   LLQ pain 11/17/2021   OAB (overactive bladder) 12/07/2020   Dysuria 10/10/2020   Peripheral neuropathy 08/05/2020   Hand pain 08/05/2020   Rash 01/13/2020   Constipation 01/01/2020   Renal cyst 01/01/2020   Liver cyst 01/01/2020   Low back pain 01/01/2020   Abdominal pain 12/08/2019   HTN (hypertension) 10/28/2019  Prediabetes 10/28/2019   Right knee pain 10/28/2019   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   Nuclear sclerotic cataract of both eyes 10/15/2019   Bilateral knee pain 05/29/2019    PCP: Donna Rollene MATSU, FNP  REFERRING PROVIDER: Dineen Rollene MATSU, FNP  REFERRING DIAG: groin pain, back pain   Rationale for Evaluation and Treatment: Rehabilitation  THERAPY DIAG:  Pain of both hip joints  Other low back pain  ONSET DATE: Feb 2024   SUBJECTIVE:                                                                                                                                                                                            SUBJECTIVE STATEMENT: Pt reports No pain or stiffness today. Has been happy with her progress so far. Took a 30 minute walk that helps loosen her up.   PERTINENT HISTORY:  83yoF originally from [place], then [place] until 5 years ago presents with persistent pain in bilat hips, groins, low back that started abotu 18 months prior following a partial colectomy. Imaging reassuring shows arthritis. Pt no longer able to sleep in prone as she always has. Pain worse in morning, as bad a 8/10, but improves within 1 hour of waking, much improved with walking. PT used to walk daily in [place], but lack of sidewalks here have cramped her style. Pt denies any franke changes to strength or balance but does notice some intermittent weaving at times- no falls.   PAIN:  Are you having pain? None   PRECAUTIONS: None   WEIGHT BEARING RESTRICTIONS: No   FALLS:  Has patient fallen in last 6 months? No   PATIENT GOALS: Improve postural tolerance in bed, reduce waking pain.   OBJECTIVE:  Note: Objective measures were completed at evaluation unless otherwise noted.   TREATMENT DATE 04/30/24                                                                                                                                 There.Ex:   LTR's: 2x20/side  Hook lying marches with 2# AW's: 3x10   Hook lying bridge with ball squeeze: 2x12  Hook lying clam shell with GTB: 2x12  Hook lying bridge + clam shell with GTB: 3x6  Seated reverse clamshell: 3x6 bilat. Performed alternating with RTB and green ball at knees for added stability to prevent compensations.   There.Act: STS: 3x10 holding 2 KG med ball  Standing hip abduction: x10/LE RTB. R/L lateral lean. Regressed to YTB. 2x10/LE. Updated to Hep with new hand out provided.      Manual Therapy: Not billed -RLE Long axis distraction 2x60sec using mob belt for improved hip mobility       PATIENT EDUCATION:  Education details: HEP and role of daily walking   Person educated: Donna Ferguson  Education method: Research scientist (medical), deliberate practice, positive reinforcement, explicit instruction, establish rules. Education comprehension: good   HOME EXERCISE PROGRAM: Access Code: [email or web address] URL: [email or web address] Date: 04/30/2024 Prepared by: Dorina Kingfisher  Exercises - Supine Figure 4 Piriformis Stretch  - 2 x daily - 2 reps - 30 hold - Seated Flexion Stretch  - 1 x daily - 2 reps - 30 hold - Seated Trunk Rotation Stretch  - 2 x daily - 2 reps - 30 hold - Prone Press Up  - 2 x daily - 1 sets - 10 reps - Sit to Stand with Arms Crossed  - 2 x daily - 3 sets - 10 reps - Standing Hip Abduction with Resistance at Ankles and Counter Support  - 1 x daily - 3 x weekly - 3 sets - 8 reps  Access Code: [email or web address] URL: [email or web address] Date: 04/14/2024 Prepared by: Peggye Linear  Exercises - Supine Figure 4 Piriformis Stretch  - 2 x daily - 2 reps - 30 hold - Seated Flexion Stretch  - 1 x daily - 2 reps - 30 hold - Seated Trunk Rotation Stretch  - 2 x daily - 2 reps - 30 hold - Prone Press Up  - 2 x daily - 1 sets - 10 reps - Sit to Stand with Arms Crossed  - 2 x daily - 3 sets - 10 reps  ASSESSMENT:  CLINICAL IMPRESSION: Continuing POC working on Lumbopelvic mobility and LE strength. Pt presents with notable weakness in glut max as evidenced by bridging technique with lumbar extension compensation and limited glut clearance. Pt continues to be motivated to participate with no pain throughout exercises. Progressed at end of session to more standing/functional positions for glut strengthening. Updated HEP accordingly. Patient will benefit from skilled physical therapy intervention to reduce deficits and impairments identified in evaluation, in order to reduce pain, improve quality of life, and maximize activity tolerance for ADL, IADL, and leisure/fitness.   OBJECTIVE IMPAIRMENTS: Decreased knowledge of condition,  decreased use of DME, decreased mobility, difficulty walking, decreased strength, decreased ROM. ACTIVITY LIMITATIONS: Lifting, standing, walking, squatting, transfers, locomotion level PARTICIPATION LIMITATIONS: Cleaning, laundry, interpersonal relationships, driving, yardwork, community activity.  PERSONAL FACTORS: Age, behavior pattern, education, past/current experiences, transportation, profession  are also affecting patient's functional outcome.  REHAB POTENTIAL: Good CLINICAL DECISION MAKING: Medium  EVALUATION COMPLEXITY: Moderate   GOALS: Goals reviewed with patient? No   SHORT TERM GOALS: Target date: 05/15/24  Worst pain in most recent 2 weeks <6/10.  Baseline: 8/10 at eval Goal status: INITIAL  2.  Pt to report consistent HEP performance with improvement in general flexibility and reduces RLE stiffness.  Baseline:  Goal status: INITIAL  LONG TERM GOALS: Target date:   Pt to report decrease  incidence in waking pain to <3days/week in most recent week.  Baseline:  Goal status: INITIAL  2.  Pt confident in long term maintenance program for hip/lumbar pain control and strength.  Baseline:  Goal status: INITIAL  3.  Improved MODI score by >15%  Baseline: eval: 44%  Goal status: INITIAL  4.  Pt to report ability to walk 1 mile for exercise 2-3x weekly without exacerbation of or limitation by pain.  Baseline:  Goal status: INITIAL  PLAN:  PT FREQUENCY: 1-2x/week  PT DURATION: 8 weeks   PLANNED INTERVENTIONS: 97110-Therapeutic exercises, 97530- Therapeutic activity, [id number]- Neuromuscular re-education, 97535- Self Care, 02859- Manual therapy, [phone]- Gait training, [phone]- Electrical stimulation (unattended), [phone]- Electrical stimulation (manual), Patient/Family education, Balance training, Stair training, Joint mobilization, Joint manipulation, Cryotherapy, and Moist heat.  PLAN FOR NEXT SESSION:  F/u on updated HEP (hip abduction) Manual therapy for ROM/hip  tightness as appropriate Progressive  hip strengthening as tolerated   Dorina HERO. Fairly IV, PT, DPT Physical Therapist- [place]  [facility] 04/30/2024, 1:11 PM

## 2024-05-06 ENCOUNTER — Ambulatory Visit

## 2024-05-06 DIAGNOSIS — M25551 Pain in right hip: Secondary | ICD-10-CM | POA: Diagnosis not present

## 2024-05-06 DIAGNOSIS — M5459 Other low back pain: Secondary | ICD-10-CM

## 2024-05-06 NOTE — Therapy (Signed)
 OUTPATIENT PHYSICAL THERAPY TREATMENT  Patient Name: Donna Ferguson MRN: [id number] DOB:[birth year], 83 y.o., female Today's Date: 05/06/2024  END OF SESSION:  PT End of Session - 05/06/24 1043     Visit Number 6    Number of Visits 16    Date for PT Re-Evaluation 06/09/24    Authorization Type Medicare    Authorization Time Period 04/14/24-06/09/24    Progress Note Due on Visit 10    PT Start Time 1020    PT Stop Time 1100    PT Time Calculation (min) 40 min    Activity Tolerance Patient tolerated treatment well;No increased pain    Behavior During Therapy WFL for tasks assessed/performed          Past Medical History:  Diagnosis Date   Aortic atherosclerosis (HCC)    Arthritis    Chronic kidney disease, stage 3a (HCC)    Constipation    Diastolic dysfunction 01/04/2020   a.) TTE 01/04/2020: EF 60-65%, mild LVH, triv MR, mild-mod AoV sclerosis without stenosis, G1DD.   Diverticulosis    Dyspnea    Heart murmur    History of colonoscopy    2019 in [place]; patient states no longer screening for colon cancer.   Hypertension    IBS (irritable bowel syndrome)    Lower back pain    Pneumonia    PONV (postoperative nausea and vomiting)    Pre-diabetes    Renal cyst    Right leg DVT (HCC)    Past Surgical History:  Procedure Laterality Date   ABDOMINAL HYSTERECTOMY     unsure if has ovaries.    BOWEL RESECTION N/A 10/02/2022   Procedure: SMALL BOWEL RESECTION, open, possible colectomy, RNFA to assist;  Surgeon: Jordis Laneta FALCON, MD;  Location: [facility];  Service: General;  Laterality: N/A;   BREAST CYST EXCISION Bilateral    BREAST EXCISIONAL BIOPSY Bilateral late 80s, early 90s    benign   BREAST SURGERY     COLONOSCOPY     COLONOSCOPY WITH PROPOFOL  N/A 05/31/2022   Procedure: COLONOSCOPY WITH PROPOFOL ;  Surgeon: Unk Corinn Skiff, MD;  Location: [facility];  Service: Gastroenterology;  Laterality: N/A;   JOINT REPLACEMENT     REPLACEMENT TOTAL KNEE Left 2017    TOTAL KNEE ARTHROPLASTY Right 12/28/2021   Procedure: TOTAL KNEE ARTHROPLASTY;  Surgeon: Edie Norleen PARAS, MD;  Location: [facility];  Service: Orthopedics;  Laterality: Right;   Patient Active Problem List   Diagnosis Date Noted   Right groin pain 02/26/2024   Osteopenia 01/08/2024   Goals of care, counseling/discussion 10/16/2022   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   Mural thickening of colon    Polyp of ascending colon    Polyp of descending colon    Abnormal CT scan, colon 05/01/2022   Epigastric pain 04/02/2022   Status post total knee replacement using cement, 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   Lymphadenopathy 12/05/2021   Chronic kidney disease, stage 3a (HCC) 11/29/2021   Irritable 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 diarrhea 11/29/2021   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   LLQ pain 11/17/2021   OAB (overactive bladder) 12/07/2020   Dysuria 10/10/2020   Peripheral neuropathy 08/05/2020   Hand pain 08/05/2020   Rash 01/13/2020   Constipation 01/01/2020   Renal cyst 01/01/2020   Liver cyst 01/01/2020   Low back pain 01/01/2020   Abdominal pain 12/08/2019   HTN (hypertension) 10/28/2019   Prediabetes 10/28/2019   Right knee pain 10/28/2019  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   Nuclear sclerotic cataract of both eyes 10/15/2019   Bilateral knee pain 05/29/2019    PCP: Dineen Rollene MATSU, FNP  REFERRING PROVIDER: Dineen Rollene MATSU, FNP  REFERRING DIAG: groin pain, back pain   Rationale for Evaluation and Treatment: Rehabilitation  THERAPY DIAG:  Pain of both hip joints  Other low back pain  ONSET DATE: Feb 2024   SUBJECTIVE:                                                                                                                                                                                           SUBJECTIVE STATEMENT: Pt reports No pain or stiffness today.  Has been happy with her progress so far. Took a 30 minute walk that helps loosen her up.   PERTINENT HISTORY:  83yoF originally from [place], then [place] until 5 years ago presents with persistent pain in bilat hips, groins, low back that started abotu 18 months prior following a partial colectomy. Imaging reassuring shows arthritis. Pt no longer able to sleep in prone as she always has. Pain worse in morning, as bad a 8/10, but improves within 1 hour of waking, much improved with walking. PT used to walk daily in [place], but lack of sidewalks here have cramped her style. Pt denies any franke changes to strength or balance but does notice some intermittent weaving at times- no falls.   PAIN:  Are you having pain? None   PRECAUTIONS: None   WEIGHT BEARING RESTRICTIONS: No   FALLS:  Has patient fallen in last 6 months? No   PATIENT GOALS: Improve postural tolerance in bed, reduce waking pain.   OBJECTIVE:  Note: Objective measures were completed at evaluation unless otherwise noted.  TREATMENT DATE 05/06/24                                                                                                                                 -AA/ROM Nustep x4 minutes, level 3, arms/seat 7  -RLE Long axis distraction 2x60sec using mob  belt for improved hip mobility  -Attempted supine Rt hip FABER Stretch with overpressure: poorly tolerated -Attempted supine Rt hip FABER Stretch with overpressure with mobilization belt lateral glide: poorly tolerated -prone Rt hip anterior glide Grade IV mobilization 2x30sec than 1x30sec grade IV in FABER position  -STS x12 c 5kg ball -lateral stepping 1x53ft bilat c 5kg ball -STS x12 c 5kg ball -lateral stepping 1x36ft bilat c GreenTB at thighs.   -alteranting fwd, backward AMB 10ft wth RTB above knees (3x each)    PATIENT EDUCATION:  Education details: HEP and role of daily walking  Person educated: Gigi  Education method: Research scientist (medical), deliberate  practice, positive reinforcement, explicit instruction, establish rules. Education comprehension: good   HOME EXERCISE PROGRAM: Access Code: [email or web address] URL: [email or web address] Date: 04/30/2024 Prepared by: Dorina Kingfisher  Exercises - Supine Figure 4 Piriformis Stretch  - 2 x daily - 2 reps - 30 hold - Seated Flexion Stretch  - 1 x daily - 2 reps - 30 hold - Seated Trunk Rotation Stretch  - 2 x daily - 2 reps - 30 hold - Prone Press Up  - 2 x daily - 1 sets - 10 reps - Sit to Stand with Arms Crossed  - 2 x daily - 3 sets - 10 reps - Standing Hip Abduction with Resistance at Ankles and Counter Support  - 1 x daily - 3 x weekly - 3 sets - 8 reps  Access Code: [email or web address] URL: [email or web address] Date: 04/14/2024 Prepared by: Peggye Linear  Exercises - Supine Figure 4 Piriformis Stretch  - 2 x daily - 2 reps - 30 hold - Seated Flexion Stretch  - 1 x daily - 2 reps - 30 hold - Seated Trunk Rotation Stretch  - 2 x daily - 2 reps - 30 hold - Prone Press Up  - 2 x daily - 1 sets - 10 reps - Sit to Stand with Arms Crossed  - 2 x daily - 3 sets - 10 reps  ASSESSMENT:  CLINICAL IMPRESSION: Continuing POC working on Lumbopelvic mobility and LE strength. Pt presents with notable weakness in glut max as evidenced by bridging technique with lumbar extension compensation and limited glut clearance. Pt continues to be motivated to participate with no pain throughout exercises. Progressed at end of session to more standing/functional positions for glut strengthening. Updated HEP accordingly. Patient will benefit from skilled physical therapy intervention to reduce deficits and impairments identified in evaluation, in order to reduce pain, improve quality of life, and maximize activity tolerance for ADL, IADL, and leisure/fitness.   OBJECTIVE IMPAIRMENTS: Decreased knowledge of condition, decreased use of DME, decreased mobility, difficulty walking, decreased strength,  decreased ROM. ACTIVITY LIMITATIONS: Lifting, standing, walking, squatting, transfers, locomotion level PARTICIPATION LIMITATIONS: Cleaning, laundry, interpersonal relationships, driving, yardwork, community activity.  PERSONAL FACTORS: Age, behavior pattern, education, past/current experiences, transportation, profession  are also affecting patient's functional outcome.  REHAB POTENTIAL: Good CLINICAL DECISION MAKING: Medium  EVALUATION COMPLEXITY: Moderate   GOALS: Goals reviewed with patient? No   SHORT TERM GOALS: Target date: 05/15/24  Worst pain in most recent 2 weeks <6/10.  Baseline: 8/10 at eval Goal status: MET;  2.  Pt to report consistent HEP performance with improvement in general flexibility and reduces RLE stiffness.  Baseline:  Goal status: MET  LONG TERM GOALS: Target date:   Pt to report decrease incidence in waking pain to <3days/week in most recent week.  Baseline:  Goal status: INITIAL  2.  Pt confident in long term  maintenance program for hip/lumbar pain control and strength.  Baseline:  Goal status: INITIAL  3.  Improved MODI score by >15%  Baseline: eval: 44%  Goal status: INITIAL  4.  Pt to report ability to walk 1 mile for exercise 2-3x weekly without exacerbation of or limitation by pain.  Baseline:  Goal status: INITIAL  PLAN:  PT FREQUENCY: 1-2x/week  PT DURATION: 8 weeks   PLANNED INTERVENTIONS: 97110-Therapeutic exercises, 97530- Therapeutic activity, [id number]- Neuromuscular re-education, 97535- Self Care, 02859- Manual therapy, [id number]- Gait training, [phone]- Electrical stimulation (unattended), [phone]- Electrical stimulation (manual), Patient/Family education, Balance training, Stair training, Joint mobilization, Joint manipulation, Cryotherapy, and Moist heat.  PLAN FOR NEXT SESSION:  F/u on updated HEP (hip abduction) Manual therapy for ROM/hip tightness as appropriate Progressive  hip strengthening as tolerated  10:44 AM,  05/06/24 Peggye JAYSON Linear, PT, DPT Physical Therapist - [place] [facility]  Outpatient Physical Therapy- Main Campus [phone]

## 2024-05-08 ENCOUNTER — Ambulatory Visit

## 2024-05-08 DIAGNOSIS — M5459 Other low back pain: Secondary | ICD-10-CM

## 2024-05-08 DIAGNOSIS — M25551 Pain in right hip: Secondary | ICD-10-CM | POA: Diagnosis not present

## 2024-05-08 NOTE — Therapy (Signed)
 OUTPATIENT PHYSICAL THERAPY TREATMENT  Patient Name: Donna Ferguson MRN: [id number] DOB:February 27, 1941, 83 y.o., female Today's Date: 05/08/2024  END OF SESSION:  PT End of Session - 05/08/24 0852     Visit Number 7    Number of Visits 16    Date for Recertification  06/09/24    Authorization Type Medicare    Authorization Time Period 04/14/24-06/09/24    Progress Note Due on Visit 10    PT Start Time 0848    PT Stop Time 0929    PT Time Calculation (min) 41 min    Equipment Utilized During Treatment Gait belt    Activity Tolerance Patient tolerated treatment well;No increased pain    Behavior During Therapy WFL for tasks assessed/performed          Past Medical History:  Diagnosis Date   Aortic atherosclerosis (HCC)    Arthritis    Chronic kidney disease, stage 3a (HCC)    Constipation    Diastolic dysfunction 01/04/2020   a.) TTE 01/04/2020: EF 60-65%, mild LVH, triv MR, mild-mod AoV sclerosis without stenosis, G1DD.   Diverticulosis    Dyspnea    Heart murmur    History of colonoscopy    2019 in [place]; patient states no longer screening for colon cancer.   Hypertension    IBS (irritable bowel syndrome)    Lower back pain    Pneumonia    PONV (postoperative nausea and vomiting)    Pre-diabetes    Renal cyst    Right leg DVT (HCC)    Past Surgical History:  Procedure Laterality Date   ABDOMINAL HYSTERECTOMY     unsure if has ovaries.    BOWEL RESECTION N/A 10/02/2022   Procedure: SMALL BOWEL RESECTION, open, possible colectomy, RNFA to assist;  Surgeon: Jordis Laneta FALCON, MD;  Location: [facility];  Service: General;  Laterality: N/A;   BREAST CYST EXCISION Bilateral    BREAST EXCISIONAL BIOPSY Bilateral late 80s, early 90s    benign   BREAST SURGERY     COLONOSCOPY     COLONOSCOPY WITH PROPOFOL  N/A 05/31/2022   Procedure: COLONOSCOPY WITH PROPOFOL ;  Surgeon: Unk Corinn Skiff, MD;  Location: [facility];  Service: Gastroenterology;  Laterality: N/A;   JOINT  REPLACEMENT     REPLACEMENT TOTAL KNEE Left 2017   TOTAL KNEE ARTHROPLASTY Right 12/28/2021   Procedure: TOTAL KNEE ARTHROPLASTY;  Surgeon: Edie Norleen PARAS, MD;  Location: [facility];  Service: Orthopedics;  Laterality: Right;   Patient Active Problem List   Diagnosis Date Noted   Right groin pain 02/26/2024   Osteopenia 01/08/2024   Goals of care, counseling/discussion 10/16/2022   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   Mural thickening of colon    Polyp of ascending colon    Polyp of descending colon    Abnormal CT scan, colon 05/01/2022   Epigastric pain 04/02/2022   Status post total knee replacement using cement, 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   Lymphadenopathy 12/05/2021   Chronic kidney disease, stage 3a (HCC) 11/29/2021   Irritable 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 diarrhea 11/29/2021   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   LLQ pain 11/17/2021   OAB (overactive bladder) 12/07/2020   Dysuria 10/10/2020   Peripheral neuropathy 08/05/2020   Hand pain 08/05/2020   Rash 01/13/2020   Constipation 01/01/2020   Renal cyst 01/01/2020   Liver cyst 01/01/2020   Low back pain 01/01/2020   Abdominal pain 12/08/2019   HTN (hypertension) 10/28/2019  Prediabetes 10/28/2019   Right knee pain 10/28/2019   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   Nuclear sclerotic cataract of both eyes 10/15/2019   Bilateral knee pain 05/29/2019    PCP: Dineen Rollene MATSU, FNP  REFERRING PROVIDER: Dineen Rollene MATSU, FNP  REFERRING DIAG: groin pain, back pain   Rationale for Evaluation and Treatment: Rehabilitation  THERAPY DIAG:  Pain of both hip joints  Other low back pain  ONSET DATE: Feb 2024   SUBJECTIVE:                                                                                                                                                                                            SUBJECTIVE STATEMENT: Pt reports no pain and states have not taken tylenol  in the morning for over a week. She does endorse some bilateral knee soreness - worse in am (from old TKRs)   PERTINENT HISTORY:  83yoF originally from [place], then [place] until 5 years ago presents with persistent pain in bilat hips, groins, low back that started abotu 18 months prior following a partial colectomy. Imaging reassuring shows arthritis. Pt no longer able to sleep in prone as she always has. Pain worse in morning, as bad a 8/10, but improves within 1 hour of waking, much improved with walking. PT used to walk daily in [place], but lack of sidewalks here have cramped her style. Pt denies any franke changes to strength or balance but does notice some intermittent weaving at times- no falls.   PAIN:  Are you having pain? None   PRECAUTIONS: None   WEIGHT BEARING RESTRICTIONS: No   FALLS:  Has patient fallen in last 6 months? No   PATIENT GOALS: Improve postural tolerance in bed, reduce waking pain.   OBJECTIVE:  Note: Objective measures were completed at evaluation unless otherwise noted.  TREATMENT DATE 05/08/24  Patient reporting some knee stiffness/soreness more than any LBP or hip pain so instrcuted in some self stretches at steps today: - Knee flex stretch (on 2nd step) hold 30 sec x 3 each LE -Knee ext stretch (on 2nd step) hold 30 sec x 3 each LE  -leaning leg on table for modified faber stretch- Patient able to achieve position well and hold 30 sec x 3 each LE  -Seated hip IR (knees squeezing on ball) while separating feet 2 x 10 reps    PATIENT EDUCATION:  Education details: HEP and role of daily walking  Person educated: Tarah  Education method: Research scientist (medical), deliberate practice, positive reinforcement, explicit instruction, establish rules. Education  comprehension: good   HOME EXERCISE PROGRAM: Access Code: [email or web address] URL: [email or web address] Date: 05/08/2024 Prepared by: Reyes London  Exercises - Supine Figure 4 Piriformis Stretch  - 2 x daily - 2 reps - 30 hold - Seated Flexion Stretch  - 1 x daily - 2 reps - 30 hold - Seated Trunk Rotation Stretch  - 2 x daily - 2 reps - 30 hold - Prone Press Up  - 2 x daily - 1 sets - 10 reps - Sit to Stand with Arms Crossed  - 2 x daily - 3 sets - 10 reps - Standing Hip Abduction with Resistance at Ankles and Counter Support  - 1 x daily - 3 x weekly - 3 sets - 8 reps - Standing Hip ER Stretch at Table  - 1 x daily - 3 sets - 20-30 hold - Standing Knee Flexion Stretch on Step  - 1 x daily - 3 sets - 20-30 sec hold - Standing Hamstring Stretch with Step  - 1 x daily - 3 sets - 30 sec hold - Seated Hip Internal Rotation with Ball and Resistance  - 3 x weekly - 3 sets - 10 reps - 2-3 sec hold        Access Code: [email or web address] URL: [email or web address] Date: 04/30/2024 Prepared by: Dorina Kingfisher  Exercises - Supine Figure 4 Piriformis Stretch  - 2 x daily - 2 reps - 30 hold - Seated Flexion Stretch  - 1 x daily - 2 reps - 30 hold - Seated Trunk Rotation Stretch  - 2 x daily - 2 reps - 30 hold - Prone Press Up  - 2 x daily - 1 sets - 10 reps - Sit to Stand with Arms Crossed  - 2 x daily - 3 sets - 10 reps - Standing Hip Abduction with Resistance at Ankles and Counter Support  - 1 x daily - 3 x weekly - 3 sets - 8 reps  Access Code: [email or web address] URL: [email or web address] Date: 04/14/2024 Prepared by: Peggye Linear  Exercises - Supine Figure 4 Piriformis Stretch  - 2 x daily - 2 reps - 30 hold - Seated Flexion Stretch  - 1 x daily - 2 reps - 30 hold - Seated Trunk Rotation Stretch  - 2 x daily - 2 reps - 30 hold - Prone Press Up  - 2 x daily - 1 sets - 10 reps - Sit to Stand with Arms Crossed  - 2 x daily - 3 sets - 10  reps  ASSESSMENT:  CLINICAL IMPRESSION: Patient continues to perform well and experiencing no pain before, during, or immediately after treatment. Stated some relief after performing knee stretches and able to achieve good hip stretch with modified faber stretch instructed today. She is going to cancel 1 of 2 visits next week and  may be close to discharge in next few visits if continues to present with good functional mobility and no pain.Patient will benefit from skilled physical therapy intervention to reduce deficits and impairments identified in evaluation, in order to reduce pain, improve quality of life, and maximize activity tolerance for ADL, IADL, and leisure/fitness.   OBJECTIVE IMPAIRMENTS: Decreased knowledge of condition, decreased use of DME, decreased mobility, difficulty walking, decreased strength, decreased ROM. ACTIVITY LIMITATIONS: Lifting, standing, walking, squatting, transfers, locomotion level PARTICIPATION LIMITATIONS: Cleaning, laundry, interpersonal relationships, driving, yardwork, community activity.  PERSONAL FACTORS: Age, behavior pattern, education, past/current experiences, transportation, profession  are also affecting patient's functional outcome.  REHAB POTENTIAL: Good CLINICAL DECISION MAKING: Medium  EVALUATION COMPLEXITY: Moderate   GOALS: Goals reviewed with patient? No   SHORT TERM GOALS: Target date: 05/15/24  Worst pain in most recent 2 weeks <6/10.  Baseline: 8/10 at eval; 05/08/2024= No pain  Goal status: MET  2.  Pt to report consistent HEP performance with improvement in general flexibility and reduces RLE stiffness.  Baseline:  Goal status: MET  LONG TERM GOALS: Target date:   Pt to report decrease incidence in waking pain to <3days/week in most recent week.  Baseline: 9/192/2025- Patient reports has not taken tylenol  in several days and reports 75% improvement in morning pain Goal status: Progressing   2.  Pt confident in long term  maintenance program for hip/lumbar pain control and strength.  Baseline:  Goal status: INITIAL  3.  Improved MODI score by >15%  Baseline: eval: 44%  Goal status: INITIAL  4.  Pt to report ability to walk 1 mile for exercise 2-3x weekly without exacerbation of or limitation by pain.  Baseline: 05/08/2024= Patient reports able to walk for 35+ min 4-5 days/weeks Goal status: INITIAL  PLAN:  PT FREQUENCY: 1-2x/week  PT DURATION: 8 weeks   PLANNED INTERVENTIONS: 97110-Therapeutic exercises, 97530- Therapeutic activity, 97112- Neuromuscular re-education, 97535- Self Care, 02859- Manual therapy, [phone]- Gait training, [phone]- Electrical stimulation (unattended), [phone]- Electrical stimulation (manual), Patient/Family education, Balance training, Stair training, Joint mobilization, Joint manipulation, Cryotherapy, and Moist heat.  PLAN FOR NEXT SESSION:  F/u on updated HEP  Manual therapy for ROM/hip tightness as appropriate Progressive  hip strengthening as tolerated  11:02 AM, 05/08/24 Chyrl London, PT Physical Therapist - [place] [facility]  Outpatient Physical Therapy- Main Campus [phone]

## 2024-05-12 ENCOUNTER — Ambulatory Visit

## 2024-05-14 ENCOUNTER — Ambulatory Visit

## 2024-05-14 DIAGNOSIS — M25551 Pain in right hip: Secondary | ICD-10-CM

## 2024-05-14 DIAGNOSIS — M5459 Other low back pain: Secondary | ICD-10-CM

## 2024-05-14 NOTE — Therapy (Signed)
 OUTPATIENT PHYSICAL THERAPY TREATMENT  Patient Name: Donna Ferguson MRN: [id number] DOB:12-Nov-1940, 83 y.o., female Today's Date: 05/14/2024  END OF SESSION:  PT End of Session - 05/14/24 1325     Visit Number 8    Number of Visits 16    Date for Recertification  06/09/24    Authorization Type Medicare    Authorization Time Period 04/14/24-06/09/24    Progress Note Due on Visit 10    PT Start Time 1315    PT Stop Time 1355    PT Time Calculation (min) 40 min    Equipment Utilized During Treatment Gait belt    Activity Tolerance Patient tolerated treatment well;No increased pain    Behavior During Therapy WFL for tasks assessed/performed          Past Medical History:  Diagnosis Date   Aortic atherosclerosis    Arthritis    Chronic kidney disease, stage 3a (HCC)    Constipation    Diastolic dysfunction 01/04/2020   a.) TTE 01/04/2020: EF 60-65%, mild LVH, triv MR, mild-mod AoV sclerosis without stenosis, G1DD.   Diverticulosis    Dyspnea    Heart murmur    History of colonoscopy    2019 in [place]; patient states no longer screening for colon cancer.   Hypertension    IBS (irritable bowel syndrome)    Lower back pain    Pneumonia    PONV (postoperative nausea and vomiting)    Pre-diabetes    Renal cyst    Right leg DVT (HCC)    Past Surgical History:  Procedure Laterality Date   ABDOMINAL HYSTERECTOMY     unsure if has ovaries.    BOWEL RESECTION N/A 10/02/2022   Procedure: SMALL BOWEL RESECTION, open, possible colectomy, RNFA to assist;  Surgeon: Donna Laneta FALCON, MD;  Location: [facility];  Service: General;  Laterality: N/A;   BREAST CYST EXCISION Bilateral    BREAST EXCISIONAL BIOPSY Bilateral late 80s, early 90s    benign   BREAST SURGERY     COLONOSCOPY     COLONOSCOPY WITH PROPOFOL  N/A 05/31/2022   Procedure: COLONOSCOPY WITH PROPOFOL ;  Surgeon: Donna Corinn Skiff, MD;  Location: [facility];  Service: Gastroenterology;  Laterality: N/A;   JOINT  REPLACEMENT     REPLACEMENT TOTAL KNEE Left 2017   TOTAL KNEE ARTHROPLASTY Right 12/28/2021   Procedure: TOTAL KNEE ARTHROPLASTY;  Surgeon: Donna Norleen PARAS, MD;  Location: [facility];  Service: Orthopedics;  Laterality: Right;   Patient Active Problem List   Diagnosis Date Noted   Right groin pain 02/26/2024   Osteopenia 01/08/2024   Goals of care, counseling/discussion 10/16/2022   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   Mural thickening of colon    Polyp of ascending colon    Polyp of descending colon    Abnormal CT scan, colon 05/01/2022   Epigastric pain 04/02/2022   Status post total knee replacement using cement, 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   Lymphadenopathy 12/05/2021   Chronic kidney disease, stage 3a (HCC) 11/29/2021   Irritable 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 diarrhea 11/29/2021   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   LLQ pain 11/17/2021   OAB (overactive bladder) 12/07/2020   Dysuria 10/10/2020   Peripheral neuropathy 08/05/2020   Hand pain 08/05/2020   Rash 01/13/2020   Constipation 01/01/2020   Renal cyst 01/01/2020   Liver cyst 01/01/2020   Low back pain 01/01/2020   Abdominal pain 12/08/2019   HTN (hypertension) 10/28/2019   Prediabetes  10/28/2019   Right knee pain 10/28/2019   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   Nuclear sclerotic cataract of both eyes 10/15/2019   Bilateral knee pain 05/29/2019    PCP: Donna Rollene MATSU, FNP  REFERRING PROVIDER: Dineen Rollene MATSU, FNP  REFERRING DIAG: groin pain, back pain   Rationale for Evaluation and Treatment: Rehabilitation  THERAPY DIAG:  Pain of both hip joints  Other low back pain  ONSET DATE: Feb 2024   SUBJECTIVE:                                                                                                                                                                                            SUBJECTIVE STATEMENT: Pt reports no pain and states have not taken tylenol  in the morning for over a week. She does endorse some bilateral knee soreness - worse in am (from old TKRs)   PERTINENT HISTORY:  83yoF originally from [place], then [place] until 5 years ago presents with persistent pain in bilat hips, groins, low back that started abotu 18 months prior following a partial colectomy. Imaging reassuring shows arthritis. Pt no longer able to sleep in prone as she always has. Pain worse in morning, as bad a 8/10, but improves within 1 hour of waking, much improved with walking. PT used to walk daily in [place], but lack of sidewalks here have cramped her style. Pt denies any franke changes to strength or balance but does notice some intermittent weaving at times- no falls.   PAIN:  Are you having pain? None   PRECAUTIONS: None   WEIGHT BEARING RESTRICTIONS: No   FALLS:  Has patient fallen in last 6 months? No   PATIENT GOALS: Improve postural tolerance in bed, reduce waking pain.   OBJECTIVE:  Note: Objective measures were completed at evaluation unless otherwise noted.  TREATMENT DATE 05/14/24                                                                                                                                 -Overground  AMB indoors and outdoors x7 minutes -STS x12 c 5kg ball  -MODI survey: 2%  -Lateral stepping in // bars 6x bilat, c 5kg ball  -STS x12 c 5kg ball  -Lateral stepping in // bars 6x bilat, c greenTB at thighs -alternate forward, backward AMB in hallway with redTB at thighs  PATIENT EDUCATION:  Education details: HEP and role of daily walking  Person educated: Donna Ferguson  Education method: Research scientist (medical), deliberate practice, positive reinforcement, explicit instruction, establish rules. Education comprehension: good   HOME EXERCISE PROGRAM: Access Code: [email or web address] URL: [email or web address] Date: 05/08/2024 Prepared by: Donna Ferguson  Exercises - Supine Figure 4 Piriformis Stretch  - 2 x daily - 2 reps - 30 hold - Seated Flexion Stretch  - 1 x daily - 2 reps - 30 hold - Seated Trunk Rotation Stretch  - 2 x daily - 2 reps - 30 hold - Prone Press Up  - 2 x daily - 1 sets - 10 reps - Sit to Stand with Arms Crossed  - 2 x daily - 3 sets - 10 reps - Standing Hip Abduction with Resistance at Ankles and Counter Support  - 1 x daily - 3 x weekly - 3 sets - 8 reps - Standing Hip ER Stretch at Table  - 1 x daily - 3 sets - 20-30 hold - Standing Knee Flexion Stretch on Step  - 1 x daily - 3 sets - 20-30 sec hold - Standing Hamstring Stretch with Step  - 1 x daily - 3 sets - 30 sec hold - Seated Hip Internal Rotation with Ball and Resistance  - 3 x weekly - 3 sets - 10 reps - 2-3 sec hold  ASSESSMENT:  CLINICAL IMPRESSION: Pt continues to advance well. All goals met. Pt will return for 1 more visit to assure stability of progress then plan to DC at that time. Patient will benefit from skilled physical therapy intervention to reduce deficits and impairments identified in evaluation, in order to reduce pain, improve quality of life, and maximize activity tolerance for ADL, IADL, and leisure/fitness.   OBJECTIVE IMPAIRMENTS: Decreased knowledge of condition, decreased use of DME, decreased mobility, difficulty walking, decreased strength, decreased ROM. ACTIVITY LIMITATIONS: Lifting, standing, walking, squatting, transfers, locomotion level PARTICIPATION LIMITATIONS: Cleaning, laundry, interpersonal relationships, driving, yardwork, community activity.  PERSONAL FACTORS: Age, behavior pattern, education, past/current experiences, transportation, profession  are also affecting patient's functional outcome.  REHAB POTENTIAL: Good CLINICAL DECISION MAKING: Medium  EVALUATION COMPLEXITY: Moderate   GOALS: Goals reviewed with patient? No   SHORT TERM GOALS: Target date: 05/15/24  Worst pain in most recent 2 weeks <6/10.   Baseline: 8/10 at eval; 05/08/2024= No pain  Goal status: MET  2.  Pt to report consistent HEP performance with improvement in general flexibility and reduces RLE stiffness.  Baseline:  Goal status: MET  LONG TERM GOALS: Target date:   Pt to report decrease incidence in waking pain to <3days/week in most recent week.  Baseline: 05/08/2024- Patient reports has not taken tylenol  in several days and reports 75% improvement in morning pain Goal status: ACHIEVED    2.  Pt confident in long term maintenance program for hip/lumbar pain control and strength.  Baseline: HEP updated 05/08/24 Goal status: ACHIEVED  3.  Improved MODI score by >15%  Baseline: eval: 44%; 05/14/24: 2%   Goal status: ACHIEVED  4.  Pt to report ability to walk 1 mile for exercise 2-3x weekly without exacerbation of or limitation by pain.  Baseline: 05/08/2024= Patient reports able to walk for 35+ min 4-5 days/weeks Goal status: ACHIEVED   PLAN:  PT FREQUENCY: 1-2x/week  PT DURATION: 8 weeks   PLANNED INTERVENTIONS: 97110-Therapeutic exercises, 97530- Therapeutic activity, 97112- Neuromuscular re-education, 97535- Self Care, 02859- Manual therapy, [phone]- Gait training, [phone]- Electrical stimulation (unattended), [phone]- Electrical stimulation (manual), Patient/Family education, Balance training, Stair training, Joint mobilization, Joint manipulation, Cryotherapy, and Moist heat.  PLAN FOR NEXT SESSION:  DC  1:28 PM, 05/14/24 Peggye JAYSON Linear, PT, DPT Physical Therapist - [facility]  Outpatient Physical Therapy- Main [facility] [phone]

## 2024-05-19 ENCOUNTER — Ambulatory Visit

## 2024-05-21 ENCOUNTER — Ambulatory Visit: Admitting: Physical Therapy

## 2024-05-25 ENCOUNTER — Ambulatory Visit: Admitting: Family

## 2024-05-25 ENCOUNTER — Encounter: Payer: Self-pay | Admitting: Family

## 2024-05-25 VITALS — BP 126/80 | HR 89 | Temp 97.9°F | Ht 63.0 in | Wt 154.6 lb

## 2024-05-25 DIAGNOSIS — R1031 Right lower quadrant pain: Secondary | ICD-10-CM | POA: Diagnosis not present

## 2024-05-25 DIAGNOSIS — Z13220 Encounter for screening for lipoid disorders: Secondary | ICD-10-CM | POA: Diagnosis not present

## 2024-05-25 DIAGNOSIS — Z136 Encounter for screening for cardiovascular disorders: Secondary | ICD-10-CM

## 2024-05-25 DIAGNOSIS — I10 Essential (primary) hypertension: Secondary | ICD-10-CM | POA: Diagnosis not present

## 2024-05-25 DIAGNOSIS — R7303 Prediabetes: Secondary | ICD-10-CM | POA: Diagnosis not present

## 2024-05-25 NOTE — Progress Notes (Unsigned)
   Assessment & Plan:  There are no diagnoses linked to this encounter.   Return precautions given.   Risks, benefits, and alternatives of the medications and treatment plan prescribed today were discussed, and patient expressed understanding.   Education regarding symptom management and diagnosis given to patient on AVS either electronically or printed.  No follow-ups on file.  Rollene Northern, FNP  Subjective:    Patient ID: Donna Ferguson, female    DOB: 1940/08/21, 83 y.o.   MRN: [id number]  CC: Donna Ferguson is a 83 y.o. female who presents today for follow up.   HPI: Left groin pain and low back has resolved with PT She is no longer taking tylenol .   She has been seeing a nutritionist for IBS and loose stools are better with dietary changes.    Allergies: Patient has no known allergies. Current Outpatient Medications on File Prior to Visit  Medication Sig Dispense Refill   acetaminophen  (TYLENOL ) 650 MG CR tablet Take 650 mg by mouth daily.     amLODipine  (NORVASC ) 5 MG tablet TAKE 1 TABLET(5 MG) BY MOUTH EVERY EVENING 90 tablet 3   Calcium  Citrate-Vitamin D  (CALCIUM  CITRATE + PO) Take 400 mg by mouth daily. Take 1 pill 1 day then the next days she takes 2 pills     cholecalciferol  (VITAMIN D3) 25 MCG (1000 UNIT) tablet Take 1,000 Units by mouth daily.     losartan  (COZAAR ) 25 MG tablet TAKE 1 TABLET(25 MG) BY MOUTH DAILY 90 tablet 3   metoprolol  succinate (TOPROL -XL) 50 MG 24 hr tablet TAKE 1 TABLET(50 MG) BY MOUTH DAILY WITH OR IMMEDIATELY FOLLOWING A MEAL 90 tablet 3   Multiple Vitamins-Minerals (CENTRUM SILVER 50+WOMEN) TABS Take 1 tablet by mouth daily.     trospium  (SANCTURA ) 20 MG tablet Take 1 tablet (20 mg total) by mouth at bedtime. 90 tablet 3   vitamin C (ASCORBIC ACID ) 500 MG tablet Take 1,000 mg by mouth daily.     No current facility-administered medications on file prior to visit.    Review of Systems    Objective:    BP 126/80   Pulse 89   Temp  97.9 F (36.6 C) (Oral)   Ht 5' 3 (1.6 m)   Wt 154 lb 9.6 oz (70.1 kg)   SpO2 99%   BMI 27.39 kg/m  BP Readings from Last 3 Encounters:  05/25/24 126/80  02/26/24 130/70  01/08/24 118/70   Wt Readings from Last 3 Encounters:  05/25/24 154 lb 9.6 oz (70.1 kg)  02/26/24 152 lb 2 oz (69 kg)  01/08/24 155 lb (70.3 kg)    Physical Exam

## 2024-05-26 ENCOUNTER — Ambulatory Visit

## 2024-05-27 NOTE — Assessment & Plan Note (Signed)
Resolved at this time. Will continue to monitor

## 2024-05-27 NOTE — Assessment & Plan Note (Signed)
Chronic, stable .continue Norvasc 5 mg qd, losartan 25 mg qd,  Toprol 50 mg qd.

## 2024-05-28 ENCOUNTER — Ambulatory Visit: Attending: Family

## 2024-05-28 ENCOUNTER — Ambulatory Visit

## 2024-05-28 ENCOUNTER — Telehealth: Payer: Self-pay | Admitting: Family

## 2024-05-28 DIAGNOSIS — M5459 Other low back pain: Secondary | ICD-10-CM | POA: Diagnosis present

## 2024-05-28 DIAGNOSIS — R262 Difficulty in walking, not elsewhere classified: Secondary | ICD-10-CM | POA: Insufficient documentation

## 2024-05-28 DIAGNOSIS — M25551 Pain in right hip: Secondary | ICD-10-CM | POA: Diagnosis present

## 2024-05-28 DIAGNOSIS — M25662 Stiffness of left knee, not elsewhere classified: Secondary | ICD-10-CM | POA: Insufficient documentation

## 2024-05-28 DIAGNOSIS — M25552 Pain in left hip: Secondary | ICD-10-CM | POA: Insufficient documentation

## 2024-05-28 NOTE — Therapy (Signed)
 OUTPATIENT PHYSICAL THERAPY TREATMENT/DISCHARGE   Patient Name: Donna Ferguson MRN: [id number] DOB:1941-04-10, 83 y.o., female Today's Date: 05/28/2024  END OF SESSION:  PT End of Session - 05/28/24 1321     Visit Number 9    Number of Visits 16    Date for Recertification  06/09/24    Authorization Type Medicare    Authorization Time Period 04/14/24-06/09/24    Progress Note Due on Visit 10    PT Start Time 1315    PT Stop Time 1355    PT Time Calculation (min) 40 min          Past Medical History:  Diagnosis Date   Aortic atherosclerosis    Arthritis    Chronic kidney disease, stage 3a (HCC)    Constipation    Diastolic dysfunction 01/04/2020   a.) TTE 01/04/2020: EF 60-65%, mild LVH, triv MR, mild-mod AoV sclerosis without stenosis, G1DD.   Diverticulosis    Dyspnea    Heart murmur    History of colonoscopy    2019 in [place]; patient states no longer screening for colon cancer.   Hypertension    IBS (irritable bowel syndrome)    Lower back pain    Pneumonia    PONV (postoperative nausea and vomiting)    Pre-diabetes    Renal cyst    Right leg DVT (HCC)    Past Surgical History:  Procedure Laterality Date   ABDOMINAL HYSTERECTOMY     unsure if has ovaries.    BOWEL RESECTION N/A 10/02/2022   Procedure: SMALL BOWEL RESECTION, open, possible colectomy, RNFA to assist;  Surgeon: Jordis Laneta FALCON, MD;  Location: [facility];  Service: General;  Laterality: N/A;   BREAST CYST EXCISION Bilateral    BREAST EXCISIONAL BIOPSY Bilateral late 80s, early 90s    benign   BREAST SURGERY     COLONOSCOPY     COLONOSCOPY WITH PROPOFOL  N/A 05/31/2022   Procedure: COLONOSCOPY WITH PROPOFOL ;  Surgeon: Unk Corinn Skiff, MD;  Location: [facility];  Service: Gastroenterology;  Laterality: N/A;   JOINT REPLACEMENT     REPLACEMENT TOTAL KNEE Left 2017   TOTAL KNEE ARTHROPLASTY Right 12/28/2021   Procedure: TOTAL KNEE ARTHROPLASTY;  Surgeon: Edie Norleen PARAS, MD;  Location: [facility];   Service: Orthopedics;  Laterality: Right;   Patient Active Problem List   Diagnosis Date Noted   Right groin pain 02/26/2024   Osteopenia 01/08/2024   Goals of care, counseling/discussion 10/16/2022   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   Mural thickening of colon    Polyp of ascending colon    Polyp of descending colon    Abnormal CT scan, colon 05/01/2022   Epigastric pain 04/02/2022   Status post total knee replacement using cement, 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   Lymphadenopathy 12/05/2021   Chronic kidney disease, stage 3a (HCC) 11/29/2021   Irritable 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 diarrhea 11/29/2021   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   LLQ pain 11/17/2021   OAB (overactive bladder) 12/07/2020   Dysuria 10/10/2020   Peripheral neuropathy 08/05/2020   Hand pain 08/05/2020   Rash 01/13/2020   Constipation 01/01/2020   Renal cyst 01/01/2020   Liver cyst 01/01/2020   Low back pain 01/01/2020   Abdominal pain 12/08/2019   HTN (hypertension) 10/28/2019   Prediabetes 10/28/2019   Right knee pain 10/28/2019   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 She has still been walking, She recently awoke with a swollen Left knee, thinks she may have positioned it with torsion overnight.  This swelling has been painful at time sand limiting for walking routine. Pt would like to start PT for left if possible.   PERTINENT HISTORY:  83yoF originally from [place], then [place] until 5 years ago presents with persistent pain in bilat hips, groins, low back that started abotu 18 months prior following a partial colectomy. Imaging reassuring shows arthritis. Pt no longer able to sleep in prone as she always has. Pain worse in morning, as bad a 8/10, but improves within 1 hour of waking, much improved with walking. PT used to walk daily in [place], but lack of sidewalks here have cramped her style. Pt denies any franke changes to strength or balance but does notice some intermittent weaving at times- no falls.   PAIN:  Are you having pain? None   PRECAUTIONS: None   WEIGHT BEARING RESTRICTIONS: No   FALLS:  Has patient fallen in last 6 months? No   PATIENT GOALS: Improve postural tolerance in bed, reduce waking pain.   OBJECTIVE:  Note: Objective measures were completed at evaluation unless otherwise noted.  TREATMENT DATE 05/28/24                                                                                                                                 -Nustep for AA/ROM Left knee, level 2 x  -HEP  education for left knee swelling  -seated heel slides on cloth 1x20  -seated LAQ 1x15x3secH  -supine quad set on towel roll 15x3secH  -supine SAQ over bolster x15 -hooklying bridge x15 -ice applied to Left knee   PATIENT EDUCATION:  Education details: HEP and role of daily walking  Person educated: [place]  Education method: Research scientist (medical), deliberate practice, positive reinforcement, explicit instruction, establish rules. Education comprehension: good   HOME EXERCISE PROGRAM: Access Code: [email or web address] URL: [email or web address] Date: 05/28/2024 Prepared by: Peggye Linear  Exercises - Long Arc Quad Right (with towel)   - 3-5 x daily - 1 sets - 15-20  reps - Seated Long Arc Quad  - 3 x daily - 1 sets - 15 reps - 3sech hold - Supine Quad Set  - 3 x daily - 1 sets - 15 reps - 3sec hold - Supine Knee Extension Strengthening  - 3 x daily - 1 sets - 15 reps - 3sec hold - Supine Bridge  - 3 x daily - 1 sets - 15 reps  ASSESSMENT:  CLINICAL IMPRESSION: Pt returns after 2 week DC trail to independent program, reports stable performance of Rt hip pain. Per last session, all goals met. Plan remains for pt to be DC at this time for Rt hip pain issue. Pt asks about starting PT for acute left knee swelling/pain problem that started recently- discussed need for doctors order to treat this per current [employer], will fax an order to FNP [facility]. Pt currently scheduled for next appointment on Wed 10/15, will plan on this visit to be Left knee pain evaluation.   OBJECTIVE IMPAIRMENTS: Decreased knowledge of condition, decreased use of DME, decreased mobility, difficulty walking, decreased strength, decreased ROM. ACTIVITY LIMITATIONS: Lifting, standing, walking, squatting, transfers, locomotion level PARTICIPATION LIMITATIONS: Cleaning, laundry, interpersonal relationships, driving, yardwork, community activity.  PERSONAL FACTORS: Age, behavior pattern, education, past/current experiences, transportation, profession  are also affecting patient's functional outcome.  REHAB POTENTIAL: Good CLINICAL DECISION MAKING: Medium  EVALUATION COMPLEXITY: Moderate   GOALS: Goals reviewed with patient? No   SHORT TERM GOALS: Target date: 05/15/24  Worst pain in most recent 2 weeks <6/10.  Baseline: 8/10 at eval; 05/08/2024= No pain  Goal status: MET  2.  Pt to report consistent HEP performance with improvement in general flexibility and reduces RLE stiffness.  Baseline:  Goal status: MET  LONG TERM GOALS: Target date:   Pt to report decrease incidence in waking pain to <3days/week in most recent week.  Baseline: 05/08/2024- Patient reports has not  taken tylenol  in several days and reports 75% improvement in morning pain Goal status: ACHIEVED    2.  Pt confident in long term maintenance program for hip/lumbar pain control and strength.  Baseline: HEP updated 05/08/24 Goal status: ACHIEVED  3.  Improved MODI score by >15%  Baseline: eval: 44%; 05/14/24: 2%   Goal status: ACHIEVED  4.  Pt to report ability to walk 1 mile for exercise 2-3x weekly without exacerbation of or limitation by pain.  Baseline: 05/08/2024= Patient reports able to walk for 35+ min 4-5 days/weeks Goal status: ACHIEVED   PLAN:  PT FREQUENCY: 1-2x/week  PT DURATION: 8 weeks   PLANNED INTERVENTIONS: 97110-Therapeutic exercises, 97530- Therapeutic activity, 97112- Neuromuscular re-education, 97535- Self Care, 02859- Manual therapy, [phone]- Gait training, [phone]- Electrical stimulation (unattended), [phone]- Electrical stimulation (manual), Patient/Family education, Balance training, Stair training, Joint mobilization, Joint manipulation, Cryotherapy, and Moist heat.  PLAN FOR NEXT SESSION:  Evaluate and Treat, left knee  pain   1:47 PM, 05/28/24 Peggye JAYSON Linear, PT, DPT Physical Therapist - [facility]  Outpatient Physical Therapy- Main Campus [phone]

## 2024-05-28 NOTE — Telephone Encounter (Signed)
 Copied from CRM [id number]. Topic: Referral - Question >> May 28, 2024  2:45 PM Terri G wrote: Reason for CRM: Patient is going to see her physical therapist about her knee next Wednesday and he needs Dr.Margaret needs to sign off for it, he sent the paperwork,

## 2024-05-29 NOTE — Telephone Encounter (Signed)
 Orders printed and placed in Donna Ferguson's red folder.

## 2024-06-01 NOTE — Telephone Encounter (Signed)
 Signed PT order to treat knee pain  Advise pt to follow up with me if knee pain were to worsen or persist despite PT  We didn't discuss knee pain in recent follow up 05/25/24

## 2024-06-01 NOTE — Telephone Encounter (Signed)
 Spoke to pt Informed her of below message she verbalized understanding will reach back out if knee pain persists

## 2024-06-02 ENCOUNTER — Ambulatory Visit

## 2024-06-03 ENCOUNTER — Ambulatory Visit

## 2024-06-03 DIAGNOSIS — R262 Difficulty in walking, not elsewhere classified: Secondary | ICD-10-CM

## 2024-06-03 DIAGNOSIS — M25551 Pain in right hip: Secondary | ICD-10-CM | POA: Diagnosis not present

## 2024-06-03 DIAGNOSIS — M25662 Stiffness of left knee, not elsewhere classified: Secondary | ICD-10-CM

## 2024-06-03 NOTE — Therapy (Addendum)
 OUTPATIENT PHYSICAL THERAPY EVALUATION   Patient Name: Donna Ferguson MRN: [id number] DOB:03/10/1941, 83 y.o., female Today's Date: 06/03/2024  END OF SESSION:  PT End of Session - 06/03/24 0936     Visit Number 1    Number of Visits 16    Date for Recertification  07/29/24    Authorization Type Medicare    Authorization Time Period 06/03/24-07/29/24    Progress Note Due on Visit 10    PT Start Time 0930    PT Stop Time 1010    PT Time Calculation (min) 40 min    Activity Tolerance Patient tolerated treatment well;No increased pain    Behavior During Therapy WFL for tasks assessed/performed          Past Medical History:  Diagnosis Date   Aortic atherosclerosis    Arthritis    Chronic kidney disease, stage 3a (HCC)    Constipation    Diastolic dysfunction 01/04/2020   a.) TTE 01/04/2020: EF 60-65%, mild LVH, triv MR, mild-mod AoV sclerosis without stenosis, G1DD.   Diverticulosis    Dyspnea    Heart murmur    History of colonoscopy    2019 in [place]; patient states no longer screening for colon cancer.   Hypertension    IBS (irritable bowel syndrome)    Lower back pain    Pneumonia    PONV (postoperative nausea and vomiting)    Pre-diabetes    Renal cyst    Right leg DVT (HCC)    Past Surgical History:  Procedure Laterality Date   ABDOMINAL HYSTERECTOMY     unsure if has ovaries.    BOWEL RESECTION N/A 10/02/2022   Procedure: SMALL BOWEL RESECTION, open, possible colectomy, RNFA to assist;  Surgeon: Jordis Laneta FALCON, MD;  Location: [facility];  Service: General;  Laterality: N/A;   BREAST CYST EXCISION Bilateral    BREAST EXCISIONAL BIOPSY Bilateral late 80s, early 90s    benign   BREAST SURGERY     COLONOSCOPY     COLONOSCOPY WITH PROPOFOL  N/A 05/31/2022   Procedure: COLONOSCOPY WITH PROPOFOL ;  Surgeon: Unk Donna Skiff, MD;  Location: [facility];  Service: Gastroenterology;  Laterality: N/A;   JOINT REPLACEMENT     REPLACEMENT TOTAL KNEE Left 2017    TOTAL KNEE ARTHROPLASTY Right 12/28/2021   Procedure: TOTAL KNEE ARTHROPLASTY;  Surgeon: Donna Norleen PARAS, MD;  Location: [facility];  Service: Orthopedics;  Laterality: Right;   Patient Active Problem List   Diagnosis Date Noted   Right groin pain 02/26/2024   Osteopenia 01/08/2024   Goals of care, counseling/discussion 10/16/2022   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   Mural thickening of colon    Polyp of ascending colon    Polyp of descending colon    Abnormal CT scan, colon 05/01/2022   Epigastric pain 04/02/2022   Status post total knee replacement using cement, 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   Lymphadenopathy 12/05/2021   Chronic kidney disease, stage 3a (HCC) 11/29/2021   Irritable 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 diarrhea 11/29/2021   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   LLQ pain 11/17/2021   OAB (overactive bladder) 12/07/2020   Dysuria 10/10/2020   Peripheral neuropathy 08/05/2020   Hand pain 08/05/2020   Rash 01/13/2020   Constipation 01/01/2020   Renal cyst 01/01/2020   Liver cyst 01/01/2020   Low back pain 01/01/2020   Abdominal pain 12/08/2019   HTN (hypertension) 10/28/2019   Prediabetes 10/28/2019   Right knee pain 10/28/2019  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   Nuclear sclerotic cataract of both eyes 10/15/2019   Bilateral knee pain 05/29/2019    PCP: Donna Rollene MATSU, FNP  REFERRING PROVIDER: Dineen Rollene MATSU, FNP  REFERRING DIAG: Left knee pain, swelling   Rationale for Evaluation and Treatment: Rehabilitation  THERAPY DIAG:  Stiffness of left knee, not elsewhere classified  Difficulty in walking, not elsewhere classified  ONSET DATE: May 19, 2024   SUBJECTIVE:                                                                                                                                                                                            SUBJECTIVE STATEMENT: She recently awoke with a swollen Left knee, thinks she may have positioned it with torsion overnight. This swelling has been painful at times and limiting for walking routine. Knee was aggravated from being on her feet a lot over the weekend.   PERTINENT HISTORY:  83yoF originally from [place], then [place] until 5 years ago presents with insidious onset Left knee swelling and pain, pt just previously DC from here for pain in bilat hips, groins, low back. Pt recently resumed a walking regimen, previously very active and on feet when living in [place], but less so after moving to [place].   PAIN:  Are you having pain? Discomfort only.    PRECAUTIONS: None   WEIGHT BEARING RESTRICTIONS: No   FALLS:  Has patient fallen in last 6 months? No   PATIENT GOALS: Improve postural tolerance in bed, reduce waking pain.   OBJECTIVE:  Note: Objective measures were completed at evaluation unless otherwise noted.  -Rt patella circumference: 43cm, Left 41cm -10cm proximal: Rigth 46cm Left 43cm -5xSTS: 10sec hands free  - : self selected 9.53sec; fastest: 6.70sec  (1.76m/s)  -Rt trunk lean over RLE, not antalgic -Koos: 48%  -Strength assessnment 10RM testing in wellzone knee extension: 11.5 reps Right (90-0 degrees) 12 reps Left (1 plate)   TREATMENT DATE 06/03/24                                                                                                                                -  Rt patella circumference: 43cm, Left 41cm -10cm proximal: Rigth 46cm Left 43cm -5xSTS: 10sec hands free (no pain with this today) - : self selected 9.53sec; fastest: 6.70sec  (1.75m/s)  -Rt trunk lean over RLE, not antalgic -Koos: 48%  -Strength assessnment 10RM testing in wellzone knee extension: 11.5 reps Right (90-0 degrees) 12 reps Left (1 plate)   PATIENT EDUCATION:  Education details: HEP review, activity modification with current walking regimen.  Person educated: [name]   Education method: Research scientist (medical), deliberate practice, positive reinforcement, explicit instruction, establish rules. Education comprehension: good   HOME EXERCISE PROGRAM: Access Code: [email or web address] URL: [email or web address] Date: 05/28/2024 Prepared by: Peggye Linear  Exercises - Long Arc Quad Right (with towel)   - 3-5 x daily - 1 sets - 15-20 reps - Seated Long Arc Quad  - 3 x daily - 1 sets - 15 reps - 3sech hold - Supine Quad Set  - 3 x daily - 1 sets - 15 reps - 3sec hold - Supine Knee Extension Strengthening  - 3 x daily - 1 sets - 15 reps - 3sec hold - Supine Bridge  - 3 x daily - 1 sets - 15 reps  ASSESSMENT:  CLINICAL IMPRESSION: Pt evaluated for insidious onset Left knee pain. Pain is moderate at this time, but activity tolerance is quite limited. Pt is tolerating HEP well and modifying her walking program as needed. Pt able to functioanlly do most of exam today, but 10RM testing revleaing of significant strength needs in bilat legs, albeit they are near equal. Patient will benefit from skilled physical therapy intervention to reduce deficits and impairments identified in evaluation, in order to reduce pain, improve quality of life, and maximize activity tolerance for ADL, IADL, and leisure/fitness. Physical therapy will help pt achieve long and short term goals of care.    OBJECTIVE IMPAIRMENTS: Decreased knowledge of condition, decreased use of DME, decreased mobility, difficulty walking, decreased strength, decreased ROM. ACTIVITY LIMITATIONS: Lifting, standing, walking, squatting, transfers, locomotion level PARTICIPATION LIMITATIONS: Cleaning, laundry, interpersonal relationships, driving, yardwork, community activity.  PERSONAL FACTORS: Age, behavior pattern, education, past/current experiences, transportation, profession  are also affecting patient's functional outcome.  REHAB POTENTIAL: Good CLINICAL DECISION MAKING: Medium  EVALUATION COMPLEXITY:  Moderate   GOALS: Goals reviewed with patient? YES  SHORT TERM GOALS: Target date: 07/04/24  No pain during walking exercise in past week.   Baseline: unable to full participate in her walking without pain.  Goal status: NEW  2.  Pt to report consistent HEP performance with improvement in general flexibility and reduced LLE stiffness/pain.  Baseline:  Goal status: NEW  LONG TERM GOALS: Target date: 08/03/24  Pt to improve KOOS score by >15% to demonstrate reduced functional limitation from her knee.  Baseline: 48%  Goal status: NEW    2.  Pt to demonstrate improved strength in bilar knee extension AEB 10RM on level 4 or higher on knee extension machine in Wellzone (hoist) from 90 to 0 degrees. (Single leg performance).  Baseline:  Goal status: NEW  3.  Pt to report ability to be on her feet for prolonged periods over a busy day without increased discomfort or swelling in left knee.  Baseline:    Goal status: NEW  4.  Pt to test at 5/5 MMT for bilat hip extension, IR, ER, and ABDCT, and 5/5 for bilat knee flexion and extension.  Baseline: 05/08/2024= Patient reports able to walk for 35+ min 4-5 days/weeks Goal status: NEW   PLAN:  PT FREQUENCY: 1-2x/week  PT DURATION: 8 weeks   PLANNED INTERVENTIONS: 97110-Therapeutic exercises, 97530- Therapeutic activity, [id number]- Neuromuscular re-education, [phone]- Self Care, 02859- Manual therapy, [phone]- Gait training, [phone]- Electrical stimulation (unattended), [phone]- Electrical stimulation (manual), Patient/Family education, Balance training, Stair training, Joint mobilization, Joint manipulation, Cryotherapy, and Moist heat.  PLAN FOR NEXT SESSION:   Quad strength bilat (isolated strength, NOT composite strength; assess HS 10rm   9:42 AM, 06/03/24 Peggye JAYSON Linear, PT, DPT Physical Therapist - [place] [facility]  Outpatient Physical Therapy- Main [facility] [phone]    *addendum on 06/08/24 to correct typo  regarding machine used durign 10RM testing.  9:18 AM, 06/08/24 Peggye JAYSON Linear, PT, DPT Physical Therapist - [place] [facility]  Outpatient Physical Therapy- Main Campus [phone]

## 2024-06-04 NOTE — Addendum Note (Signed)
 Addended by: EMELIA PEGGYE BROCKS on: 06/04/2024 08:29 AM   Modules accepted: Orders

## 2024-06-05 ENCOUNTER — Ambulatory Visit

## 2024-06-08 ENCOUNTER — Ambulatory Visit

## 2024-06-08 DIAGNOSIS — M25551 Pain in right hip: Secondary | ICD-10-CM | POA: Diagnosis not present

## 2024-06-08 DIAGNOSIS — R262 Difficulty in walking, not elsewhere classified: Secondary | ICD-10-CM

## 2024-06-08 DIAGNOSIS — M25662 Stiffness of left knee, not elsewhere classified: Secondary | ICD-10-CM

## 2024-06-08 NOTE — Therapy (Signed)
 OUTPATIENT PHYSICAL THERAPY TREATMENT   Patient Name: Donna Ferguson MRN: [id number] DOB:[birth year], 83 y.o., female Today's Date: 06/08/2024  END OF SESSION:  PT End of Session - 06/08/24 1550     Visit Number 2    Number of Visits 16    Date for Recertification  07/29/24    Authorization Type Medicare    Authorization Time Period 06/03/24-07/29/24    Progress Note Due on Visit 10    PT Start Time 1530    PT Stop Time 1610    PT Time Calculation (min) 40 min    Equipment Utilized During Treatment Gait belt    Activity Tolerance Patient tolerated treatment well;No increased pain    Behavior During Therapy WFL for tasks assessed/performed          Past Medical History:  Diagnosis Date   Aortic atherosclerosis    Arthritis    Chronic kidney disease, stage 3a (HCC)    Constipation    Diastolic dysfunction 01/04/2020   a.) TTE 01/04/2020: EF 60-65%, mild LVH, triv MR, mild-mod AoV sclerosis without stenosis, G1DD.   Diverticulosis    Dyspnea    Heart murmur    History of colonoscopy    2019 in [place]; patient states no longer screening for colon cancer.   Hypertension    IBS (irritable bowel syndrome)    Lower back pain    Pneumonia    PONV (postoperative nausea and vomiting)    Pre-diabetes    Renal cyst    Right leg DVT (HCC)    Past Surgical History:  Procedure Laterality Date   ABDOMINAL HYSTERECTOMY     unsure if has ovaries.    BOWEL RESECTION N/A 10/02/2022   Procedure: SMALL BOWEL RESECTION, open, possible colectomy, RNFA to assist;  Surgeon: Donna Laneta FALCON, MD;  Location: [facility];  Service: General;  Laterality: N/A;   BREAST CYST EXCISION Bilateral    BREAST EXCISIONAL BIOPSY Bilateral late 80s, early 90s    benign   BREAST SURGERY     COLONOSCOPY     COLONOSCOPY WITH PROPOFOL  N/A 05/31/2022   Procedure: COLONOSCOPY WITH PROPOFOL ;  Surgeon: Donna Corinn Skiff, MD;  Location: [facility];  Service: Gastroenterology;  Laterality: N/A;   JOINT  REPLACEMENT     REPLACEMENT TOTAL KNEE Left 2017   TOTAL KNEE ARTHROPLASTY Right 12/28/2021   Procedure: TOTAL KNEE ARTHROPLASTY;  Surgeon: Donna Norleen PARAS, MD;  Location: [facility];  Service: Orthopedics;  Laterality: Right;   Patient Active Problem List   Diagnosis Date Noted   Right groin pain 02/26/2024   Osteopenia 01/08/2024   Goals of care, counseling/discussion 10/16/2022   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   Mural thickening of colon    Polyp of ascending colon    Polyp of descending colon    Abnormal CT scan, colon 05/01/2022   Epigastric pain 04/02/2022   Status post total knee replacement using cement, 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   Lymphadenopathy 12/05/2021   Chronic kidney disease, stage 3a (HCC) 11/29/2021   Irritable 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 diarrhea 11/29/2021   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   LLQ pain 11/17/2021   OAB (overactive bladder) 12/07/2020   Dysuria 10/10/2020   Peripheral neuropathy 08/05/2020   Hand pain 08/05/2020   Rash 01/13/2020   Constipation 01/01/2020   Renal cyst 01/01/2020   Liver cyst 01/01/2020   Low back pain 01/01/2020   Abdominal pain 12/08/2019   HTN (hypertension) 10/28/2019  Prediabetes 10/28/2019   Right knee pain 10/28/2019   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   Nuclear sclerotic cataract of both eyes 10/15/2019   Bilateral knee pain 05/29/2019    PCP: Donna Rollene MATSU, FNP  REFERRING PROVIDER: Dineen Rollene MATSU, FNP  REFERRING DIAG: Left knee pain, swelling   Rationale for Evaluation and Treatment: Rehabilitation  THERAPY DIAG:  Stiffness of left knee, not elsewhere classified  Difficulty in walking, not elsewhere classified  ONSET DATE: May 19, 2024   SUBJECTIVE:                                                                                                                                                                                            SUBJECTIVE STATEMENT: Pt reports knee is improving, still icing and heat, swelling is improved. Pt has been doing HEP without much issue. No exercise walking  PERTINENT HISTORY:  83yoF originally from [place], then [place] until 5 years ago presents with insidious onset Left knee swelling and pain, pt just previously DC from here for pain in bilat hips, groins, low back. Pt recently resumed a walking regimen, previously very active and on feet when living in [place], but less so after moving to [place].   PAIN:  Are you having pain? none    PRECAUTIONS: None   WEIGHT BEARING RESTRICTIONS: No   FALLS:  Has patient fallen in last 6 months? No   PATIENT GOALS: Improve postural tolerance in bed, reduce waking pain.   OBJECTIVE:  Note: Objective measures were completed at evaluation unless otherwise noted.  -Rt patella circumference: 43cm, Left 41cm -10cm proximal: Rigth 46cm Left 43cm -5xSTS: 10sec hands free  - : self selected 9.53sec; fastest: 6.70sec  (1.19m/s)  -Rt trunk lean over RLE, not antalgic -Koos: 48%  -Strength assessnment 10RM testing in wellzone knee extension: 11.5 reps Right (90-0 degrees) 12 reps Left (1 plate)   TREATMENT DATE 06/08/24                                                                                                                                -  AA/ROM Nustep foot pulleys x3 minutes, level 2, seat 7, arms 9 -SAQ 1x10 bilat @6lb  -hooklying bridge c GTB 1x13 -SAQ 1x15 bilat AT 6lb AW -hooklying bridge @ GTB x15 - -SAQ 1x15 bilat AT 7lb AW -hooklying bridge @ BTB x15  -SAQ 1x10 bilat AT 8lb AW  -seated cable HS curls 7.5lb 3x10 bilat (seated chair +airex)    PATIENT EDUCATION:  Education details: HEP review, activity modification with current walking regimen.  Person educated: Donna Ferguson  Education method: Research scientist (medical), deliberate practice, positive reinforcement, explicit instruction,  establish rules. Education comprehension: good   HOME EXERCISE PROGRAM: Access Code: [email or web address] URL: [email or web address] Date: 05/28/2024 Prepared by: Donna Ferguson  Exercises - Long Arc Quad Right (with towel)   - 3-5 x daily - 1 sets - 15-20 reps - Seated Long Arc Quad  - 3 x daily - 1 sets - 15 reps - 3sech hold - Supine Quad Set  - 3 x daily - 1 sets - 15 reps - 3sec hold - Supine Knee Extension Strengthening  - 3 x daily - 1 sets - 15 reps - 3sec hold - Supine Bridge  - 3 x daily - 1 sets - 15 reps  ASSESSMENT:  CLINICAL IMPRESSION: Heavy emphasis on high volume, moderate loading of knees and hips. Pt tolerates >40 reps in most exercises, no focal pain ,but acknowledges fatigue from cumulative efforts. Pt will resume fitness walking later this week on a modified volume schedule. Patient will benefit from skilled physical therapy intervention to reduce deficits and impairments identified in evaluation, in order to reduce pain, improve quality of life, and maximize activity tolerance for ADL, IADL, and leisure/fitness. Physical therapy will help pt achieve long and short term goals of care.    OBJECTIVE IMPAIRMENTS: Decreased knowledge of condition, decreased use of DME, decreased mobility, difficulty walking, decreased strength, decreased ROM. ACTIVITY LIMITATIONS: Lifting, standing, walking, squatting, transfers, locomotion level PARTICIPATION LIMITATIONS: Cleaning, laundry, interpersonal relationships, driving, yardwork, community activity.  PERSONAL FACTORS: Age, behavior pattern, education, past/current experiences, transportation, profession  are also affecting patient's functional outcome.  REHAB POTENTIAL: Good CLINICAL DECISION MAKING: Medium  EVALUATION COMPLEXITY: Moderate   GOALS: Goals reviewed with patient? YES  SHORT TERM GOALS: Target date: 07/04/24  No pain during walking exercise in past week.   Baseline: unable to full participate in her  walking without pain.  Goal status: NEW  2.  Pt to report consistent HEP performance with improvement in general flexibility and reduced LLE stiffness/pain.  Baseline:  Goal status: NEW  LONG TERM GOALS: Target date: 08/03/24  Pt to improve KOOS score by >15% to demonstrate reduced functional limitation from her knee.  Baseline: 48%  Goal status: NEW    2.  Pt to demonstrate improved strength in bilar knee extension AEB 10RM on level 4 or higher on knee extension machine in Well[employer] (hoist) from 90 to 0 degrees. (Single leg performance).  Baseline:  Goal status: NEW  3.  Pt to report ability to be on her feet for prolonged periods over a busy day without increased discomfort or swelling in left knee.  Baseline:    Goal status: NEW  4.  Pt to test at 5/5 MMT for bilat hip extension, IR, ER, and ABDCT, and 5/5 for bilat knee flexion and extension.  Baseline: 05/08/2024= Patient reports able to walk for 35+ min 4-5 days/weeks Goal status: NEW   PLAN:  PT FREQUENCY: 1-2x/week  PT DURATION: 8 weeks   PLANNED INTERVENTIONS: 97110-Therapeutic exercises, 97530-  Therapeutic activity, [id number]- Neuromuscular re-education, [phone]- Self Care, 02859- Manual therapy, [phone]- Gait training, [phone]- Electrical stimulation (unattended), [phone]- Electrical stimulation (manual), Patient/Family education, Balance training, Stair training, Joint mobilization, Joint manipulation, Cryotherapy, and Moist heat.  PLAN FOR NEXT SESSION:   Quad strength bilat (isolated strength, NOT composite strength; assess HS 10rm)   4:10 PM, 06/08/24 Donna JAYSON Ferguson, PT, DPT Physical Therapist - [place] [facility]  Outpatient Physical Therapy- Main Campus [phone]

## 2024-06-09 ENCOUNTER — Other Ambulatory Visit (INDEPENDENT_AMBULATORY_CARE_PROVIDER_SITE_OTHER)

## 2024-06-09 ENCOUNTER — Ambulatory Visit

## 2024-06-09 DIAGNOSIS — R7303 Prediabetes: Secondary | ICD-10-CM

## 2024-06-09 DIAGNOSIS — Z13220 Encounter for screening for lipoid disorders: Secondary | ICD-10-CM | POA: Diagnosis not present

## 2024-06-09 DIAGNOSIS — I10 Essential (primary) hypertension: Secondary | ICD-10-CM

## 2024-06-09 DIAGNOSIS — Z136 Encounter for screening for cardiovascular disorders: Secondary | ICD-10-CM | POA: Diagnosis not present

## 2024-06-09 LAB — LIPID PANEL
Cholesterol: 143 mg/dL (ref 0–200)
HDL: 59.1 mg/dL (ref >39.00)
LDL Cholesterol: 66 mg/dL (ref 0–99)
NonHDL: 83.57
Total CHOL/HDL Ratio: 2
Triglycerides: 86 mg/dL (ref 0.0–149.0)
VLDL: 17.2 mg/dL (ref 0.0–40.0)

## 2024-06-09 LAB — COMPREHENSIVE METABOLIC PANEL WITH GFR
ALT: 17 U/L (ref 0–35)
AST: 21 U/L (ref 0–37)
Albumin: 4.2 g/dL (ref 3.5–5.2)
Alkaline Phosphatase: 97 U/L (ref 39–117)
BUN: 25 mg/dL — ABNORMAL HIGH (ref 6–23)
CO2: 27 meq/L (ref 19–32)
Calcium: 9.4 mg/dL (ref 8.4–10.5)
Chloride: 103 meq/L (ref 96–112)
Creatinine, Ser: 1.1 mg/dL (ref 0.40–1.20)
GFR: 47.04 mL/min — ABNORMAL LOW (ref >60.00)
Glucose, Bld: 98 mg/dL (ref 70–99)
Potassium: 4.4 meq/L (ref 3.5–5.1)
Sodium: 139 meq/L (ref 135–145)
Total Bilirubin: 0.5 mg/dL (ref 0.2–1.2)
Total Protein: 6.9 g/dL (ref 6.0–8.3)

## 2024-06-09 LAB — HEMOGLOBIN A1C: Hgb A1c MFr Bld: 6.3 % (ref 4.6–6.5)

## 2024-06-11 ENCOUNTER — Ambulatory Visit

## 2024-06-11 ENCOUNTER — Telehealth: Payer: Self-pay

## 2024-06-11 NOTE — Telephone Encounter (Signed)
 Pt did not arrive for scheduled appointment. No telephone call nor message preceeded this absence. Author attempted to contact pt via telephone number listed in chart. Left a HIPAA compliant VM to contact the clinic.   9:33 AM, 06/11/24 Peggye JAYSON Linear, PT, DPT Physical Therapist - [facility]  Outpatient Physical Therapy- Main Campus [phone]

## 2024-06-15 NOTE — Therapy (Unsigned)
 OUTPATIENT PHYSICAL THERAPY TREATMENT   Patient Name: Donna Ferguson MRN: [id number] DOB:1941-07-27, 83 y.o., female Today's Date: 06/16/2024  END OF SESSION:  PT End of Session - 06/16/24 0830     Visit Number 3    Number of Visits 16    Date for Recertification  07/29/24    Authorization Type Medicare    Authorization Time Period 06/03/24-07/29/24    Progress Note Due on Visit 10    PT Start Time 0845    PT Stop Time 0927    PT Time Calculation (min) 42 min    Equipment Utilized During Treatment Gait belt    Activity Tolerance Patient tolerated treatment well;No increased pain    Behavior During Therapy WFL for tasks assessed/performed           Past Medical History:  Diagnosis Date   Aortic atherosclerosis    Arthritis    Chronic kidney disease, stage 3a (HCC)    Constipation    Diastolic dysfunction 01/04/2020   a.) TTE 01/04/2020: EF 60-65%, mild LVH, triv MR, mild-mod AoV sclerosis without stenosis, G1DD.   Diverticulosis    Dyspnea    Heart murmur    History of colonoscopy    2019 in [place]; patient states no longer screening for colon cancer.   Hypertension    IBS (irritable bowel syndrome)    Lower back pain    Pneumonia    PONV (postoperative nausea and vomiting)    Pre-diabetes    Renal cyst    Right leg DVT (HCC)    Past Surgical History:  Procedure Laterality Date   ABDOMINAL HYSTERECTOMY     unsure if has ovaries.    BOWEL RESECTION N/A 10/02/2022   Procedure: SMALL BOWEL RESECTION, open, possible colectomy, RNFA to assist;  Surgeon: Jordis Laneta FALCON, MD;  Location: [facility];  Service: General;  Laterality: N/A;   BREAST CYST EXCISION Bilateral    BREAST EXCISIONAL BIOPSY Bilateral late 80s, early 90s    benign   BREAST SURGERY     COLONOSCOPY     COLONOSCOPY WITH PROPOFOL  N/A 05/31/2022   Procedure: COLONOSCOPY WITH PROPOFOL ;  Surgeon: Unk Corinn Skiff, MD;  Location: [facility];  Service: Gastroenterology;  Laterality: N/A;   JOINT  REPLACEMENT     REPLACEMENT TOTAL KNEE Left 2017   TOTAL KNEE ARTHROPLASTY Right 12/28/2021   Procedure: TOTAL KNEE ARTHROPLASTY;  Surgeon: Edie Norleen PARAS, MD;  Location: [facility];  Service: Orthopedics;  Laterality: Right;   Patient Active Problem List   Diagnosis Date Noted   Right groin pain 02/26/2024   Osteopenia 01/08/2024   Goals of care, counseling/discussion 10/16/2022   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   Mural thickening of colon    Polyp of ascending colon    Polyp of descending colon    Abnormal CT scan, colon 05/01/2022   Epigastric pain 04/02/2022   Status post total knee replacement using cement, 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   Lymphadenopathy 12/05/2021   Chronic kidney disease, stage 3a (HCC) 11/29/2021   Irritable 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 diarrhea 11/29/2021   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   LLQ pain 11/17/2021   OAB (overactive bladder) 12/07/2020   Dysuria 10/10/2020   Peripheral neuropathy 08/05/2020   Hand pain 08/05/2020   Rash 01/13/2020   Constipation 01/01/2020   Renal cyst 01/01/2020   Liver cyst 01/01/2020   Low back pain 01/01/2020   Abdominal pain 12/08/2019   HTN (hypertension) 10/28/2019  Prediabetes 10/28/2019   Right knee pain 10/28/2019   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   Nuclear sclerotic cataract of both eyes 10/15/2019   Bilateral knee pain 05/29/2019    PCP: Dineen Rollene MATSU, FNP  REFERRING PROVIDER: Dineen Rollene MATSU, FNP  REFERRING DIAG: Left knee pain, swelling   Rationale for Evaluation and Treatment: Rehabilitation  THERAPY DIAG:  Stiffness of left knee, not elsewhere classified  Difficulty in walking, not elsewhere classified  Pain of both hip joints  Other low back pain  ONSET DATE: May 19, 2024   SUBJECTIVE:                                                                                                                                                                                            SUBJECTIVE STATEMENT: Pt. Spent the weekend painting her kitchen. She reports some soreness after the last session, using ice to help with the pian.   PERTINENT HISTORY:  83yoF originally from [place], then [place] until 5 years ago presents with insidious onset Left knee swelling and pain, pt just previously DC from here for pain in bilat hips, groins, low back. Pt recently resumed a walking regimen, previously very active and on feet when living in [place], but less so after moving to [place].   PAIN:  Are you having pain? none    PRECAUTIONS: None   WEIGHT BEARING RESTRICTIONS: No   FALLS:  Has patient fallen in last 6 months? No   PATIENT GOALS: Improve postural tolerance in bed, reduce waking pain.   OBJECTIVE:  Note: Objective measures were completed at evaluation unless otherwise noted.  -Rt patella circumference: 43cm, Left 41cm -10cm proximal: Rigth 46cm Left 43cm -5xSTS: 10sec hands free  - : self selected 9.53sec; fastest: 6.70sec  (1.38m/s)  -Rt trunk lean over RLE, not antalgic -Koos: 48%  -Strength assessnment 10RM testing in wellzone knee extension: 11.5 reps Right (90-0 degrees) 12 reps Left (1 plate)   TREATMENT DATE 06/16/24                                                                                                                                 -  AA/ROM Nustep x 6 minutes, level 3, seat 7, arms 9 -SAQ 1x10 bilat @7lb  with 1 sec hold -hooklying bridge c GTB 1x15 -SAQ 1x15 bilat AT 7lb AW -hooklying bridge @ GTB x15 - -SAQ 1x15 bilat AT 8lb AW -hooklying bridge @ BTB x15  -SAQ 1x10 bilat AT 8lb AW  -seated cable HS curls 7.5lb 2x10 bilat (seated chair ) last set 12.5# x 10 reps, R LE ROM less than L with increased resistance   -Leg press 2 x 12 @ 40#  PATIENT EDUCATION:  Education details: HEP review, activity modification with current walking  regimen.  Person educated: [place]  Education method: research scientist (medical), deliberate practice, positive reinforcement, explicit instruction, establish rules. Education comprehension: good   HOME EXERCISE PROGRAM: Access Code: [email or web address] URL: [email or web address] Date: 05/28/2024 Prepared by: Peggye Linear  Exercises - Long Arc Quad Right (with towel)   - 3-5 x daily - 1 sets - 15-20 reps - Seated Long Arc Quad  - 3 x daily - 1 sets - 15 reps - 3sech hold - Supine Quad Set  - 3 x daily - 1 sets - 15 reps - 3sec hold - Supine Knee Extension Strengthening  - 3 x daily - 1 sets - 15 reps - 3sec hold - Supine Bridge  - 3 x daily - 1 sets - 15 reps  ASSESSMENT:  CLINICAL IMPRESSION: Heavy emphasis on high volume, moderate loading of knees and hips. Pt does report some balance difficulties and decreased confidence with gait activities as a result. May be a limiting factor in patients mobility to address as pt progresses.  Patient will benefit from skilled physical therapy intervention to reduce deficits and impairments identified in evaluation, in order to reduce pain, improve quality of life, and maximize activity tolerance for ADL, IADL, and leisure/fitness. Physical therapy will help pt achieve long and short term goals of care.    OBJECTIVE IMPAIRMENTS: Decreased knowledge of condition, decreased use of DME, decreased mobility, difficulty walking, decreased strength, decreased ROM. ACTIVITY LIMITATIONS: Lifting, standing, walking, squatting, transfers, locomotion level PARTICIPATION LIMITATIONS: Cleaning, laundry, interpersonal relationships, driving, yardwork, community activity.  PERSONAL FACTORS: Age, behavior pattern, education, past/current experiences, transportation, profession  are also affecting patient's functional outcome.  REHAB POTENTIAL: Good CLINICAL DECISION MAKING: Medium  EVALUATION COMPLEXITY: Moderate   GOALS: Goals reviewed with patient?  YES  SHORT TERM GOALS: Target date: 07/04/24  No pain during walking exercise in past week.   Baseline: unable to full participate in her walking without pain.  Goal status: NEW  2.  Pt to report consistent HEP performance with improvement in general flexibility and reduced LLE stiffness/pain.  Baseline:  Goal status: NEW  LONG TERM GOALS: Target date: 08/03/24  Pt to improve KOOS score by >15% to demonstrate reduced functional limitation from her knee.  Baseline: 48%  Goal status: NEW    2.  Pt to demonstrate improved strength in bilar knee extension AEB 10RM on level 4 or higher on knee extension machine in Wellzone (hoist) from 90 to 0 degrees. (Single leg performance).  Baseline:  Goal status: NEW  3.  Pt to report ability to be on her feet for prolonged periods over a busy day without increased discomfort or swelling in left knee.  Baseline:    Goal status: NEW  4.  Pt to test at 5/5 MMT for bilat hip extension, IR, ER, and ABDCT, and 5/5 for bilat knee flexion and extension.  Baseline: 05/08/2024= Patient reports able to walk for 35+  min 4-5 days/weeks Goal status: NEW   PLAN:  PT FREQUENCY: 1-2x/week  PT DURATION: 8 weeks   PLANNED INTERVENTIONS: 97110-Therapeutic exercises, 97530- Therapeutic activity, 97112- Neuromuscular re-education, 97535- Self Care, [id number]- Manual therapy, [phone]- Gait training, [phone]- Electrical stimulation (unattended), [phone]- Electrical stimulation (manual), Patient/Family education, Balance training, Stair training, Joint mobilization, Joint manipulation, Cryotherapy, and Moist heat.  PLAN FOR NEXT SESSION:   Quad strength bilat (isolated strength, NOT composite strength; assess HS 10rm)   Address balance as indicated to improve walking confidence.  8:31 AM, 06/16/24 Note: Portions of this document were prepared using Dragon voice recognition software and although reviewed may contain unintentional dictation errors in syntax, grammar, or  spelling.  Lonni KATHEE Gainer PT ,DPT Physical Therapist- [place]  [facility]

## 2024-06-16 ENCOUNTER — Ambulatory Visit

## 2024-06-16 ENCOUNTER — Ambulatory Visit: Admitting: Physical Therapy

## 2024-06-16 DIAGNOSIS — M25662 Stiffness of left knee, not elsewhere classified: Secondary | ICD-10-CM

## 2024-06-16 DIAGNOSIS — M25551 Pain in right hip: Secondary | ICD-10-CM | POA: Diagnosis not present

## 2024-06-16 DIAGNOSIS — M5459 Other low back pain: Secondary | ICD-10-CM

## 2024-06-16 DIAGNOSIS — R262 Difficulty in walking, not elsewhere classified: Secondary | ICD-10-CM

## 2024-06-18 ENCOUNTER — Ambulatory Visit

## 2024-06-18 ENCOUNTER — Ambulatory Visit: Admitting: Physical Therapy

## 2024-06-18 DIAGNOSIS — M25662 Stiffness of left knee, not elsewhere classified: Secondary | ICD-10-CM

## 2024-06-18 DIAGNOSIS — R262 Difficulty in walking, not elsewhere classified: Secondary | ICD-10-CM

## 2024-06-18 DIAGNOSIS — M25551 Pain in right hip: Secondary | ICD-10-CM | POA: Diagnosis not present

## 2024-06-18 NOTE — Therapy (Signed)
 OUTPATIENT PHYSICAL THERAPY TREATMENT   Patient Name: Donna Ferguson MRN: [id number] DOB:Apr 14, 1941, 83 y.o., female Today's Date: 06/18/2024  END OF SESSION:  PT End of Session - 06/18/24 1604     Visit Number 4    Number of Visits 16    Date for Recertification  07/29/24    Authorization Type Medicare    Authorization Time Period 06/03/24-07/29/24    Progress Note Due on Visit 10    PT Start Time 1145    PT Stop Time 1226    PT Time Calculation (min) 41 min    Equipment Utilized During Treatment Gait belt    Activity Tolerance Patient tolerated treatment well;No increased pain    Behavior During Therapy WFL for tasks assessed/performed            Past Medical History:  Diagnosis Date   Aortic atherosclerosis    Arthritis    Chronic kidney disease, stage 3a (HCC)    Constipation    Diastolic dysfunction 01/04/2020   a.) TTE 01/04/2020: EF 60-65%, mild LVH, triv MR, mild-mod AoV sclerosis without stenosis, G1DD.   Diverticulosis    Dyspnea    Heart murmur    History of colonoscopy    2019 in [place]; patient states no longer screening for colon cancer.   Hypertension    IBS (irritable bowel syndrome)    Lower back pain    Pneumonia    PONV (postoperative nausea and vomiting)    Pre-diabetes    Renal cyst    Right leg DVT (HCC)    Past Surgical History:  Procedure Laterality Date   ABDOMINAL HYSTERECTOMY     unsure if has ovaries.    BOWEL RESECTION N/A 10/02/2022   Procedure: SMALL BOWEL RESECTION, open, possible colectomy, RNFA to assist;  Surgeon: Jordis Laneta FALCON, MD;  Location: [facility];  Service: General;  Laterality: N/A;   BREAST CYST EXCISION Bilateral    BREAST EXCISIONAL BIOPSY Bilateral late 80s, early 90s    benign   BREAST SURGERY     COLONOSCOPY     COLONOSCOPY WITH PROPOFOL  N/A 05/31/2022   Procedure: COLONOSCOPY WITH PROPOFOL ;  Surgeon: Unk Corinn Skiff, MD;  Location[facility];  Service: Gastroenterology;  Laterality: N/A;   JOINT  REPLACEMENT     REPLACEMENT TOTAL KNEE Left 2017   TOTAL KNEE ARTHROPLASTY Right 12/28/2021   Procedure: TOTAL KNEE ARTHROPLASTY;  Surgeon: Edie Norleen PARAS, MD;  Location: [facility];  Service: Orthopedics;  Laterality: Right;   Patient Active Problem List   Diagnosis Date Noted   Right groin pain 02/26/2024   Osteopenia 01/08/2024   Goals of care, counseling/discussion 10/16/2022   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   Mural thickening of colon    Polyp of ascending colon    Polyp of descending colon    Abnormal CT scan, colon 05/01/2022   Epigastric pain 04/02/2022   Status post total knee replacement using cement, 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   Lymphadenopathy 12/05/2021   Chronic kidney disease, stage 3a (HCC) 11/29/2021   Irritable 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 diarrhea 11/29/2021   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   LLQ pain 11/17/2021   OAB (overactive bladder) 12/07/2020   Dysuria 10/10/2020   Peripheral neuropathy 08/05/2020   Hand pain 08/05/2020   Rash 01/13/2020   Constipation 01/01/2020   Renal cyst 01/01/2020   Liver cyst 01/01/2020   Low back pain 01/01/2020   Abdominal pain 12/08/2019   HTN (hypertension)  10/28/2019   Prediabetes 10/28/2019   Right knee pain 10/28/2019   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  ONSET DATE: May 19, 2024   SUBJECTIVE:                                                                                                                                                                                           SUBJECTIVE  STATEMENT: Pt. Spent the weekend painting her kitchen. She reports some soreness after the last session, using ice to help with the pian.   PERTINENT HISTORY:  83yoF originally from [place], then [place] until 5 years ago presents with insidious onset Left knee swelling and pain, pt just previously DC from here for pain in bilat hips, groins, low back. Pt recently resumed a walking regimen, previously very active and on feet when living in [place], but less so after moving to [place].   PAIN:  Are you having pain? none    PRECAUTIONS: None   WEIGHT BEARING RESTRICTIONS: No   FALLS:  Has patient fallen in last 6 months? No   PATIENT GOALS: Improve postural tolerance in bed, reduce waking pain.   OBJECTIVE:  Note: Objective measures were completed at evaluation unless otherwise noted.  -Rt patella circumference: 43cm, Left 41cm -10cm proximal: Rigth 46cm Left 43cm -5xSTS: 10sec hands free  - : self selected 9.53sec; fastest: 6.70sec  (1.61m/s)  -Rt trunk lean over RLE, not antalgic -Koos: 48%  -Strength assessnment 10RM testing in wellzone knee extension: 11.5 reps Right (90-0 degrees) 12 reps Left (1 plate)   TREATMENT DATE 06/18/24                                                                                                                                 -  AA/ROM Nustep x 6 minutes, level 3, seat 7, arms 9 -SAQ 1x10 bilat @7lb  with 1 sec hold -hooklying bridge c BTB 1x15 -SAQ 1x12 bilat AT 7lb AW -hooklying bridge @ BTB x15 - -SAQ 1x12 bilat AT 8lb AW -hooklying bridge @ BTB x15  -SAQ 1x12 bilat AT 8lb AW  To well zone 10 RM at level 5 on HS curl then 2 x 15 at level 4 plate  Seated knee extension level 3 3 x 10 reps - difficulty rating   PATIENT EDUCATION:  Education details: HEP review, activity modification with current walking regimen.  Person educated: [place]  Education method: research scientist (medical), deliberate practice, positive reinforcement, explicit instruction,  establish rules. Education comprehension: good   HOME EXERCISE PROGRAM: Access Code: [email or web address] URL: [email or web address] Date: 05/28/2024 Prepared by: Peggye Linear  Exercises - Long Arc Quad Right (with towel)   - 3-5 x daily - 1 sets - 15-20 reps - Seated Long Arc Quad  - 3 x daily - 1 sets - 15 reps - 3sech hold - Supine Quad Set  - 3 x daily - 1 sets - 15 reps - 3sec hold - Supine Knee Extension Strengthening  - 3 x daily - 1 sets - 15 reps - 3sec hold - Supine Bridge  - 3 x daily - 1 sets - 15 reps  ASSESSMENT:  CLINICAL IMPRESSION:  Continued heavy emphasis on high volume, moderate loading of knees and hips. Pt does report some balance difficulties and decreased confidence with gait activities as a result. May be a limiting factor in patients mobility to address as pt progresses.  Patient will benefit from skilled physical therapy intervention to reduce deficits and impairments identified in evaluation, in order to reduce pain, improve quality of life, and maximize activity tolerance for ADL, IADL, and leisure/fitness. Physical therapy will help pt achieve long and short term goals of care.    OBJECTIVE IMPAIRMENTS: Decreased knowledge of condition, decreased use of DME, decreased mobility, difficulty walking, decreased strength, decreased ROM. ACTIVITY LIMITATIONS: Lifting, standing, walking, squatting, transfers, locomotion level PARTICIPATION LIMITATIONS: Cleaning, laundry, interpersonal relationships, driving, yardwork, community activity.  PERSONAL FACTORS: Age, behavior pattern, education, past/current experiences, transportation, profession  are also affecting patient's functional outcome.  REHAB POTENTIAL: Good CLINICAL DECISION MAKING: Medium  EVALUATION COMPLEXITY: Moderate   GOALS: Goals reviewed with patient? YES  SHORT TERM GOALS: Target date: 07/04/24  No pain during walking exercise in past week.   Baseline: unable to full participate in her  walking without pain.  Goal status: NEW  2.  Pt to report consistent HEP performance with improvement in general flexibility and reduced LLE stiffness/pain.  Baseline:  Goal status: NEW  LONG TERM GOALS: Target date: 08/03/24  Pt to improve KOOS score by >15% to demonstrate reduced functional limitation from her knee.  Baseline: 48%  Goal status: NEW    2.  Pt to demonstrate improved strength in bilar knee extension AEB 10RM on level 4 or higher on knee extension machine in Wellzone (hoist) from 90 to 0 degrees. (Single leg performance).  Baseline:  Goal status: NEW  3.  Pt to report ability to be on her feet for prolonged periods over a busy day without increased discomfort or swelling in left knee.  Baseline:    Goal status: NEW  4.  Pt to test at 5/5 MMT for bilat hip extension, IR, ER, and ABDCT, and 5/5 for bilat knee flexion and extension.  Baseline: 05/08/2024= Patient reports able to walk for  35+ min 4-5 days/weeks Goal status: NEW   PLAN:  PT FREQUENCY: 1-2x/week  PT DURATION: 8 weeks   PLANNED INTERVENTIONS: 97110-Therapeutic exercises, 97530- Therapeutic activity, 97112- Neuromuscular re-education, 97535- Self Care, 02859- Manual therapy, [phone]- Gait training, [phone]- Electrical stimulation (unattended), [phone]- Electrical stimulation (manual), Patient/Family education, Balance training, Stair training, Joint mobilization, Joint manipulation, Cryotherapy, and Moist heat.  PLAN FOR NEXT SESSION:   Quad strength bilat (isolated strength, NOT composite strength; assess HS 10rm)   Address balance as indicated to improve walking confidence.  4:05 PM, 06/18/24 Note: Portions of this document were prepared using Dragon voice recognition software and although reviewed may contain unintentional dictation errors in syntax, grammar, or spelling.  Lonni KATHEE Gainer PT ,DPT Physical Therapist- [place]  [facility]

## 2024-06-21 ENCOUNTER — Ambulatory Visit: Payer: Self-pay | Admitting: Family

## 2024-06-23 ENCOUNTER — Ambulatory Visit

## 2024-06-23 ENCOUNTER — Encounter: Payer: Self-pay | Admitting: Physical Therapy

## 2024-06-23 ENCOUNTER — Ambulatory Visit: Attending: Family | Admitting: Physical Therapy

## 2024-06-23 DIAGNOSIS — M25551 Pain in right hip: Secondary | ICD-10-CM | POA: Insufficient documentation

## 2024-06-23 DIAGNOSIS — R262 Difficulty in walking, not elsewhere classified: Secondary | ICD-10-CM | POA: Insufficient documentation

## 2024-06-23 DIAGNOSIS — M5459 Other low back pain: Secondary | ICD-10-CM | POA: Insufficient documentation

## 2024-06-23 DIAGNOSIS — M25552 Pain in left hip: Secondary | ICD-10-CM | POA: Diagnosis present

## 2024-06-23 DIAGNOSIS — M25662 Stiffness of left knee, not elsewhere classified: Secondary | ICD-10-CM | POA: Diagnosis present

## 2024-06-23 NOTE — Therapy (Signed)
 OUTPATIENT PHYSICAL THERAPY TREATMENT   Patient Name: Donna Ferguson MRN: [id number] DOB:1940-11-16, 83 y.o., female Today's Date: 06/23/2024  END OF SESSION:  PT End of Session - 06/23/24 0832     Visit Number 5    Number of Visits 16    Date for Recertification  07/29/24    Authorization Type Medicare    Authorization Time Period 06/03/24-07/29/24    Progress Note Due on Visit 10    PT Start Time 0845    PT Stop Time 0930    PT Time Calculation (min) 45 min    Equipment Utilized During Treatment Gait belt    Activity Tolerance Patient tolerated treatment well;No increased pain    Behavior During Therapy WFL for tasks assessed/performed            Past Medical History:  Diagnosis Date   Aortic atherosclerosis    Arthritis    Chronic kidney disease, stage 3a (HCC)    Constipation    Diastolic dysfunction 01/04/2020   a.) TTE 01/04/2020: EF 60-65%, mild LVH, triv MR, mild-mod AoV sclerosis without stenosis, G1DD.   Diverticulosis    Dyspnea    Heart murmur    History of colonoscopy    2019 in [place]; patient states no longer screening for colon cancer.   Hypertension    IBS (irritable bowel syndrome)    Lower back pain    Pneumonia    PONV (postoperative nausea and vomiting)    Pre-diabetes    Renal cyst    Right leg DVT (HCC)    Past Surgical History:  Procedure Laterality Date   ABDOMINAL HYSTERECTOMY     unsure if has ovaries.    BOWEL RESECTION N/A 10/02/2022   Procedure: SMALL BOWEL RESECTION, open, possible colectomy, RNFA to assist;  Surgeon: Jordis Laneta FALCON, MD;  Location: [facility];  Service: General;  Laterality: N/A;   BREAST CYST EXCISION Bilateral    BREAST EXCISIONAL BIOPSY Bilateral late 80s, early 90s    benign   BREAST SURGERY     COLONOSCOPY     COLONOSCOPY WITH PROPOFOL  N/A 05/31/2022   Procedure: COLONOSCOPY WITH PROPOFOL ;  Surgeon: Unk Corinn Skiff, MD;  Location: [facility];  Service: Gastroenterology;  Laterality: N/A;   JOINT  REPLACEMENT     REPLACEMENT TOTAL KNEE Left 2017   TOTAL KNEE ARTHROPLASTY Right 12/28/2021   Procedure: TOTAL KNEE ARTHROPLASTY;  Surgeon: Edie Norleen PARAS, MD;  Location: [facility];  Service: Orthopedics;  Laterality: Right;   Patient Active Problem List   Diagnosis Date Noted   Right groin pain 02/26/2024   Osteopenia 01/08/2024   Goals of care, counseling/discussion 10/16/2022   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   Mural thickening of colon    Polyp of ascending colon    Polyp of descending colon    Abnormal CT scan, colon 05/01/2022   Epigastric pain 04/02/2022   Status post total knee replacement using cement, 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   Lymphadenopathy 12/05/2021   Chronic kidney disease, stage 3a (HCC) 11/29/2021   Irritable 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 diarrhea 11/29/2021   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   LLQ pain 11/17/2021   OAB (overactive bladder) 12/07/2020   Dysuria 10/10/2020   Peripheral neuropathy 08/05/2020   Hand pain 08/05/2020   Rash 01/13/2020   Constipation 01/01/2020   Renal cyst 01/01/2020   Liver cyst 01/01/2020   Low back pain 01/01/2020   Abdominal pain 12/08/2019   HTN (hypertension)  10/28/2019   Prediabetes 10/28/2019   Right knee pain 10/28/2019   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   Nuclear sclerotic cataract of both eyes 10/15/2019   Bilateral knee pain 05/29/2019    PCP: Dineen Rollene MATSU, FNP  REFERRING PROVIDER: Dineen Rollene MATSU, FNP  REFERRING DIAG: Left knee pain, swelling   Rationale for Evaluation and Treatment: Rehabilitation  THERAPY DIAG:  Stiffness of left knee, not elsewhere classified  Difficulty in walking, not elsewhere classified  Pain of both hip joints  Other low back pain  ONSET DATE: May 19, 2024   SUBJECTIVE:                                                                                                                                                                                            SUBJECTIVE STATEMENT:  Pt reports she feels good overall today. Pt reports decrease in hip pain but slight discomfort in both knees, more so in the R knee, most likely soreness from last session. Pt reports using ice for pain relief following last sx. Reports minor balance issues and feeling like she sometimes has to right herself, but that the strengthening has been helping.  PERTINENT HISTORY:  83yoF originally from [place], then [place] until 5 years ago presents with insidious onset Left knee swelling and pain, pt just previously DC from here for pain in bilat hips, groins, low back. Pt recently resumed a walking regimen, previously very active and on feet when living in [place], but less so after moving to [place].   PAIN:  Are you having pain?  3/10 right now in bilat knees, but higher rating in the mornings.  PRECAUTIONS: None   WEIGHT BEARING RESTRICTIONS: No   FALLS:  Has patient fallen in last 6 months? No   PATIENT GOALS: Improve postural tolerance in bed, reduce waking pain.   OBJECTIVE:  Note: Objective measures were completed at evaluation unless otherwise noted.  -Rt patella circumference: 43cm, Left 41cm -10cm proximal: Rigth 46cm Left 43cm -5xSTS: 10sec hands free  - : self selected 9.53sec; fastest: 6.70sec  (1.77m/s)  -Rt trunk lean over RLE, not antalgic -Koos: 48%  -Strength assessnment 10RM testing in wellzone knee extension: 11.5 reps Right (90-0 degrees) 12 reps Left (1 plate)   TREATMENT DATE 06/23/24      -Nustep 4.23min level 3 seat 8, arms 9, 2.86min level 4  -Supine BTB hip abd c/ glute bridge 3x12, verbal and tactile cuing for glute activation -B Sidelying hip abd 3# 8x, cueing for placement and mm activation -B Sidelying hip abd x12   Well Zone -seated HS curl  Hoist machine 2x10 level 4 -seated HS curl Hoist machine x8 level 4 -seated knee ext  Hoist Machine x10 level 2 -Hoist machine leg press, seat 7, 3x10 level 4- cueing for proper foot placement for proper hip mechanics, education on leaving slight bend in the knee during press                                                                                                                              PATIENT EDUCATION:  Education details: HEP review, activity modification with current walking regimen, form and body mechanics of newly introduced exercises Person educated: Elany  Education method: collaborative learning, deliberate practice, positive reinforcement, explicit instruction, establish rules. Education comprehension: good   HOME EXERCISE PROGRAM: Access Code: [email or web address] URL: [email or web address] Date: 05/28/2024 Prepared by: Peggye Linear  Exercises - Long Arc Quad Right (with towel)   - 3-5 x daily - 1 sets - 15-20 reps - Seated Long Arc Quad  - 3 x daily - 1 sets - 15 reps - 3sech hold - Supine Quad Set  - 3 x daily - 1 sets - 15 reps - 3sec hold - Supine Knee Extension Strengthening  - 3 x daily - 1 sets - 15 reps - 3sec hold - Supine Bridge  - 3 x daily - 1 sets - 15 reps  ASSESSMENT:  CLINICAL IMPRESSION: Pt performed exercises well in today's session. Pt required moderate verbal and tactile cuing for proper form of sidelying hip abduction exercise for hip and knee placement. Pt would benefit from continued hip and knee loading/strengthening to improve functional ability to perform ADLs. Added leg press machine exercise to emphasize glute activation for hip ext strength. Patient will benefit from skilled physical therapy intervention to reduce deficits and impairments identified in evaluation, in order to reduce pain, improve quality of life, and maximize activity tolerance for ADL, IADL, and leisure/fitness. Physical therapy will help pt achieve long and short term goals of care.    OBJECTIVE IMPAIRMENTS: Decreased knowledge of condition,  decreased use of DME, decreased mobility, difficulty walking, decreased strength, decreased ROM. ACTIVITY LIMITATIONS: Lifting, standing, walking, squatting, transfers, locomotion level PARTICIPATION LIMITATIONS: Cleaning, laundry, interpersonal relationships, driving, yardwork, community activity.  PERSONAL FACTORS: Age, behavior pattern, education, past/current experiences, transportation, profession  are also affecting patient's functional outcome.  REHAB POTENTIAL: Good CLINICAL DECISION MAKING: Medium  EVALUATION COMPLEXITY: Moderate   GOALS: Goals reviewed with patient? YES  SHORT TERM GOALS: Target date: 07/04/24  No pain during walking exercise in past week.   Baseline: unable to full participate in her walking without pain.  Goal status: NEW  2.  Pt to report consistent HEP performance with improvement in general flexibility and reduced LLE stiffness/pain.  Baseline:  Goal status: NEW  LONG TERM GOALS: Target date: 08/03/24  Pt to improve KOOS score by >15% to demonstrate reduced functional limitation from her knee.  Baseline: 48%  Goal status: NEW    2.  Pt to demonstrate improved strength in bilar knee extension AEB 10RM on level 4 or higher on knee extension machine in Wellzone (hoist) from 90 to 0 degrees. (Single leg performance).  Baseline:  Goal status: NEW  3.  Pt to report ability to be on her feet for prolonged periods over a busy day without increased discomfort or swelling in left knee.  Baseline:    Goal status: NEW  4.  Pt to test at 5/5 MMT for bilat hip extension, IR, ER, and ABDCT, and 5/5 for bilat knee flexion and extension.  Baseline: 05/08/2024= Patient reports able to walk for 35+ min 4-5 days/weeks Goal status: NEW   PLAN:  PT FREQUENCY: 1-2x/week  PT DURATION: 8 weeks   PLANNED INTERVENTIONS: 97110-Therapeutic exercises, 97530- Therapeutic activity, 97112- Neuromuscular re-education, 97535- Self Care, 02859- Manual therapy, [phone]- Gait  training, [phone]- Electrical stimulation (unattended), [phone]- Electrical stimulation (manual), Patient/Family education, Balance training, Stair training, Joint mobilization, Joint manipulation, Cryotherapy, and Moist heat.  PLAN FOR NEXT SESSION:   Continue and monitor knee pain Continue hip and knee loading exercises for improved strength Consider adding functional/standing strengthening to monitor reported minor issues with balance     Renna Helling, SPT, 06/23/2024 8:41 AM

## 2024-06-24 ENCOUNTER — Ambulatory Visit: Admitting: Dietician

## 2024-06-24 ENCOUNTER — Encounter: Attending: Family | Admitting: Dietician

## 2024-06-24 ENCOUNTER — Encounter: Payer: Self-pay | Admitting: Dietician

## 2024-06-24 DIAGNOSIS — K219 Gastro-esophageal reflux disease without esophagitis: Secondary | ICD-10-CM | POA: Insufficient documentation

## 2024-06-24 DIAGNOSIS — R197 Diarrhea, unspecified: Secondary | ICD-10-CM | POA: Insufficient documentation

## 2024-06-24 DIAGNOSIS — R7303 Prediabetes: Secondary | ICD-10-CM | POA: Insufficient documentation

## 2024-06-24 DIAGNOSIS — N183 Chronic kidney disease, stage 3 unspecified: Secondary | ICD-10-CM | POA: Insufficient documentation

## 2024-06-24 DIAGNOSIS — I129 Hypertensive chronic kidney disease with stage 1 through stage 4 chronic kidney disease, or unspecified chronic kidney disease: Secondary | ICD-10-CM | POA: Insufficient documentation

## 2024-06-24 DIAGNOSIS — K589 Irritable 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 diarrhea: Secondary | ICD-10-CM | POA: Insufficient documentation

## 2024-06-24 DIAGNOSIS — K5900 Constipation, unspecified: Secondary | ICD-10-CM | POA: Insufficient documentation

## 2024-06-24 NOTE — Progress Notes (Signed)
 Medical Nutrition Therapy  Appointment Start time:  1030  Appointment End time:  1045  Primary concerns today: Diarrhea/Constipation  Referral diagnosis: Z87.898 - History of prediabetes, N18.30 - CKD S3 Preferred learning style: Auditory, Visual Learning readiness: Change in progress   NUTRITION ASSESSMENT    Clinical Medical Hx: Prediabetes, CKD S3, HTN, IBS, Small bowel cancer/resection, GERD Medications: Amlodipine , Losartan , Metoprolol  Labs: (02/26/2024) A1c - 6.5%, Creatinine - 1.04, eGFR - 54 (03/25/2024): Urine microalbumin - 1.0, Urine creatinine - 98.7 NEW (10/21/205): A1c - 6.3%, BUN - 25, eGFR - 47.04 Notable Signs/Symptoms: Diarrhea, Gas   Lifestyle & Dietary Hx Pt reports very few bouts of IBS symptoms since last visit, states they feel much better now. Pt reports feeling confident in being able to identifying/moderating foods that cause symptoms without logging foods and has been able to discontinue food log. Pt reports A1c was elevated to 6.5%, after last visit but has come back down to 6.3%, states they believe it was due to juicing, which they stopped a few months ago. Pt reports doing well in PT, states their hip is feeling better and they are feeling stronger. Pt reports being able to repaint their kitchen now that they are feeling healthier, and is able to be more active in general.     Estimated daily fluid intake: 48 oz Supplements: Daily MVI, Calcium , Vit D, Miralax  Sleep: Poor, previously worked 3rd shift, frequent urination during the night Stress / self-care: Low Current average weekly physical activity: ADLs, walks 5 days for 30 minutes, PT 2x weekly   24-Hr Dietary Recall First Meal: 1/2 bagel, 1/2 apple turnover, mint tea Snack:  Second Meal: Broiled chicken, mashed potatoes, okra Snack:  Third Meal:  Snack:  Beverages: Mint tea, water, protein shake   NUTRITION DIAGNOSIS  [place]-1.4 Altered GI function As related to bowel resection.  As evidenced  by frequent bouts of gas/bloating, intermittent diarrhea/constipation not experienced before surgery.   NUTRITION INTERVENTION  Nutrition education (E-1) on the following topics:  Educated patient on changes in absorption associated with ileum resection. Educated patient on FODMAP foods and their role in gas/bloating/diarrhea. Educated patient on adequate fiber and water intake for bowel regularity. Educated patient on role of consistent physical activity in bowel regularity.   Handouts Provided Include  Low FODMAP Nutrition Therpay NIH Nutrient Absorption Article  Learning Style & Readiness for Change Teaching method utilized: Visual & Auditory  Demonstrated degree of understanding via: Teach Back  Barriers to learning/adherence to lifestyle change: None  Goals Established by Pt Try having a small serving of your pomegranate juice. If tolerated, have a small glass! Try having swiss chard or boeing as choices for your cooked greens. You can try collards or mustard greens without vinegar to reassess your tolerance. Try going to the [employer] or [employer] in [place] for a great selection of seafood.   MONITORING & EVALUATION Dietary intake, weekly physical activity, and GI symptoms in PRN  Next Steps  Patient is to contact RD if needed, follow up PRN.

## 2024-06-25 ENCOUNTER — Ambulatory Visit

## 2024-06-25 DIAGNOSIS — M25662 Stiffness of left knee, not elsewhere classified: Secondary | ICD-10-CM

## 2024-06-25 DIAGNOSIS — R262 Difficulty in walking, not elsewhere classified: Secondary | ICD-10-CM

## 2024-06-25 NOTE — Therapy (Signed)
 OUTPATIENT PHYSICAL THERAPY TREATMENT   Patient Name: Tanisha Lutes MRN: [id number] DOB:Sep 11, 1940, 83 y.o., female Today's Date: 06/25/2024  END OF SESSION:  PT End of Session - 06/25/24 0849     Visit Number 6    Number of Visits 16    Date for Recertification  07/29/24    Authorization Type Medicare    Authorization Time Period 06/03/24-07/29/24    Progress Note Due on Visit 10    PT Start Time 0846    PT Stop Time 0926    PT Time Calculation (min) 40 min    Activity Tolerance Patient tolerated treatment well;No increased pain    Behavior During Therapy WFL for tasks assessed/performed            Past Medical History:  Diagnosis Date   Aortic atherosclerosis    Arthritis    Chronic kidney disease, stage 3a (HCC)    Constipation    Diastolic dysfunction 01/04/2020   a.) TTE 01/04/2020: EF 60-65%, mild LVH, triv MR, mild-mod AoV sclerosis without stenosis, G1DD.   Diverticulosis    Dyspnea    Heart murmur    History of colonoscopy    2019 in [place]; patient states no longer screening for colon cancer.   Hypertension    IBS (irritable bowel syndrome)    Lower back pain    Pneumonia    PONV (postoperative nausea and vomiting)    Pre-diabetes    Renal cyst    Right leg DVT (HCC)    Past Surgical History:  Procedure Laterality Date   ABDOMINAL HYSTERECTOMY     unsure if has ovaries.    BOWEL RESECTION N/A 10/02/2022   Procedure: SMALL BOWEL RESECTION, open, possible colectomy, RNFA to assist;  Surgeon: Jordis Laneta FALCON, MD;  Location: [facility];  Service: General;  Laterality: N/A;   BREAST CYST EXCISION Bilateral    BREAST EXCISIONAL BIOPSY Bilateral late 80s, early 90s    benign   BREAST SURGERY     COLONOSCOPY     COLONOSCOPY WITH PROPOFOL  N/A 05/31/2022   Procedure: COLONOSCOPY WITH PROPOFOL ;  Surgeon: Unk Corinn Skiff, MD;  Location: [facility];  Service: Gastroenterology;  Laterality: N/A;   JOINT REPLACEMENT     REPLACEMENT TOTAL KNEE Left 2017    TOTAL KNEE ARTHROPLASTY Right 12/28/2021   Procedure: TOTAL KNEE ARTHROPLASTY;  Surgeon: Edie Norleen PARAS, MD;  Location: [facility];  Service: Orthopedics;  Laterality: Right;   Patient Active Problem List   Diagnosis Date Noted   Right groin pain 02/26/2024   Osteopenia 01/08/2024   Goals of care, counseling/discussion 10/16/2022   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   Mural thickening of colon    Polyp of ascending colon    Polyp of descending colon    Abnormal CT scan, colon 05/01/2022   Epigastric pain 04/02/2022   Status post total knee replacement using cement, 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   Lymphadenopathy 12/05/2021   Chronic kidney disease, stage 3a (HCC) 11/29/2021   Irritable 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 diarrhea 11/29/2021   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   LLQ pain 11/17/2021   OAB (overactive bladder) 12/07/2020   Dysuria 10/10/2020   Peripheral neuropathy 08/05/2020   Hand pain 08/05/2020   Rash 01/13/2020   Constipation 01/01/2020   Renal cyst 01/01/2020   Liver cyst 01/01/2020   Low back pain 01/01/2020   Abdominal pain 12/08/2019   HTN (hypertension) 10/28/2019   Prediabetes 10/28/2019   Right knee  pain 10/28/2019   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   Nuclear sclerotic cataract of both eyes 10/15/2019   Bilateral knee pain 05/29/2019    PCP: Dineen Rollene MATSU, FNP  REFERRING PROVIDER: Dineen Rollene MATSU, FNP  REFERRING DIAG: Left knee pain, swelling   Rationale for Evaluation and Treatment: Rehabilitation  THERAPY DIAG:  Stiffness of left knee, not elsewhere classified  Difficulty in walking, not elsewhere classified  ONSET DATE: May 19, 2024   SUBJECTIVE:                                                                                                                                                                                            SUBJECTIVE STATEMENT: Pt doing well, still walking for exercise, but took the day off due to tired from house projects. No pain on arrival. 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   PERTINENT HISTORY:  83yoF originally from [place], then [place] until 5 years ago presents with insidious onset Left knee swelling and pain, pt just previously DC from here for pain in bilat hips, groins, low back. Pt recently resumed a walking regimen, previously very active and on feet when living in [place], but less so after moving to [place].   PAIN:  Are you having pain?  no  PRECAUTIONS: None   WEIGHT BEARING RESTRICTIONS: No   FALLS:  Has patient fallen in last 6 months? No   PATIENT GOALS: Improve postural tolerance in bed, reduce waking pain.   OBJECTIVE:  Note: Objective measures were completed at evaluation unless otherwise noted.  -Rt patella circumference: 43cm, Left 41cm -10cm proximal: Rigth 46cm Left 43cm -5xSTS: 10sec hands free  - : self selected 9.53sec; fastest: 6.70sec  (1.85m/s)  -Rt trunk lean over RLE, not antalgic -Koos: 48%  -Strength assessnment 10RM testing in wellzone knee extension: 11.5 reps Right (90-0 degrees) 12 reps Left (1 plate)   TREATMENT DATE 06/25/24  -AA/ROM Nustep, level 5 x4 minutes (no pain)  - Hoist knee extension machine: 3 plates 8k87 (pin at 4 to limit range 90-0 degrees)  -Hoist knee flexion machine: 5 plates 8k89 (pin on 15) - standing double heel raises x25 (BUE support)  -wall lean ankle DF (double) 1x15 (heels 12 from wall for resistance grading)  - Hoist knee extension machine: 3 plates 8k87 (pin at 4 to limit range 90-0 degrees)  -Hoist knee flexion machine: 5 plates 8k89 (pin on 15) - standing double heel raises x25 (BUE support)  -wall lean ankle DF (double) 1x15 (heels 12 from wall for resistance grading)  - Hoist  knee extension machine: 3 plates 8k87 (pin at 4 to limit range 90-0 degrees)  -Hoist knee  flexion machine: 5 plates 8k89 (pin on 15) - standing double heel raises x25 (BUE support)  -wall lean ankle DF (double) 1x15 (heels 12 from wall for resistance grading)  - Hoist knee extension machine: 3 plates 8k87 (pin at 4 to limit range 90-0 degrees)  -Hoist knee flexion machine: 5 plates 8k89 (pin on 15)  -standing hip ABDCT 1x10 bilat c 4lb AW  -standing hip extension (SLR) hand on seat of chair: 1x10 @ 4lb AW bilat  -standing hip flexion 1x10 @ 4lb AW, 2 hand support (cues for max height, avoidance of adduction) *struggles with full excursion in flexion   -standing hip ABDCT 1x10 bilat c 4lb AW  -standing hip extension (SLR) hand on seat of chair: 1x10 @ 4lb AW bilat  -standing hip flexion 1x10 @ 4lb AW, 2 hand support (cues for max height, avoidance of adduction) *struggles with full excursion in flexion                                                                                                                             PATIENT EDUCATION:  Education details: HEP review, activity modification with current walking regimen, form and body mechanics of newly introduced exercises Person educated: [place]  Education method: collaborative learning, deliberate practice, positive reinforcement, explicit instruction, establish rules. Education comprehension: good   HOME EXERCISE PROGRAM: Access Code: [email or web address] URL: [email or web address] Date: 05/28/2024 Prepared by: Peggye Linear  Exercises - Long Arc Quad Right (with towel)   - 3-5 x daily - 1 sets - 15-20 reps - Seated Long Arc Quad  - 3 x daily - 1 sets - 15 reps - 3sech hold - Supine Quad Set  - 3 x daily - 1 sets - 15 reps - 3sec hold - Supine Knee Extension Strengthening  - 3 x daily - 1 sets - 15 reps - 3sec hold - Supine Bridge  - 3 x daily - 1 sets - 15 reps  ASSESSMENT:  CLINICAL IMPRESSION: Continued with resistance training program to advance focal knee strength bilat. Symptoms remain stable. Pt  showing signs of excellent tolerance to the increase in volume of resistance training. Patient will benefit from skilled physical therapy intervention to reduce deficits and impairments identified in evaluation, in order to reduce pain, improve quality of life, and maximize activity tolerance for ADL, IADL, and leisure/fitness. Physical therapy will help pt achieve long and short term goals of care.    OBJECTIVE IMPAIRMENTS: Decreased knowledge of condition, decreased use of DME, decreased mobility, difficulty walking, decreased strength, decreased ROM. ACTIVITY LIMITATIONS: Lifting, standing, walking, squatting, transfers, locomotion level PARTICIPATION LIMITATIONS: Cleaning, laundry, interpersonal relationships, driving, yardwork, community activity.  PERSONAL FACTORS: Age, behavior pattern, education, past/current experiences, transportation, profession  are also affecting patient's functional outcome.  REHAB POTENTIAL: Good CLINICAL DECISION MAKING: Medium  EVALUATION COMPLEXITY: Moderate   GOALS: Goals reviewed  with patient? YES  SHORT TERM GOALS: Target date: 07/04/24  No pain during walking exercise in past week.   Baseline: unable to full participate in her walking without pain.  Goal status: NEW  2.  Pt to report consistent HEP performance with improvement in general flexibility and reduced LLE stiffness/pain.  Baseline:  Goal status: NEW  LONG TERM GOALS: Target date: 08/03/24  Pt to improve KOOS score by >15% to demonstrate reduced functional limitation from her knee.  Baseline: 48%  Goal status: NEW    2.  Pt to demonstrate improved strength in bilar knee extension AEB 10RM on level 4 or higher on knee extension machine in Wellzone (hoist) from 90 to 0 degrees. (Single leg performance).  Baseline:  Goal status: NEW  3.  Pt to report ability to be on her feet for prolonged periods over a busy day without increased discomfort or swelling in left knee.  Baseline:    Goal  status: NEW  4.  Pt to test at 5/5 MMT for bilat hip extension, IR, ER, and ABDCT, and 5/5 for bilat knee flexion and extension.  Baseline: 05/08/2024= Patient reports able to walk for 35+ min 4-5 days/weeks Goal status: NEW   PLAN:  PT FREQUENCY: 1-2x/week  PT DURATION: 8 weeks   PLANNED INTERVENTIONS: 97110-Therapeutic exercises, 97530- Therapeutic activity, 97112- Neuromuscular re-education, 97535- Self Care, 02859- Manual therapy, [phone]- Gait training, [phone]- Electrical stimulation (unattended), [phone]- Electrical stimulation (manual), Patient/Family education, Balance training, Stair training, Joint mobilization, Joint manipulation, Cryotherapy, and Moist heat.  PLAN FOR NEXT SESSION:  Continue to monitor knee pain Continue hip and knee loading exercises for improved strength    8:51 AM, 06/25/24 Peggye JAYSON Linear, PT, DPT Physical Therapist - [facility] Therapy- [facility] [phone]

## 2024-06-30 ENCOUNTER — Ambulatory Visit

## 2024-06-30 DIAGNOSIS — M25662 Stiffness of left knee, not elsewhere classified: Secondary | ICD-10-CM | POA: Diagnosis not present

## 2024-06-30 DIAGNOSIS — R262 Difficulty in walking, not elsewhere classified: Secondary | ICD-10-CM

## 2024-06-30 NOTE — Therapy (Signed)
 OUTPATIENT PHYSICAL THERAPY TREATMENT   Patient Name: Donna Ferguson MRN: [id number] DOB:09-18-1940, 83 y.o., female Today's Date: 06/30/2024  END OF SESSION:  PT End of Session - 06/30/24 0851     Visit Number 7    Number of Visits 16    Date for Recertification  07/29/24    Authorization Type Medicare    Authorization Time Period 06/03/24-07/29/24    Progress Note Due on Visit 10    PT Start Time 0847    PT Stop Time 0927    PT Time Calculation (min) 40 min    Activity Tolerance Patient tolerated treatment well;No increased pain    Behavior During Therapy WFL for tasks assessed/performed          Past Medical History:  Diagnosis Date   Aortic atherosclerosis    Arthritis    Chronic kidney disease, stage 3a (HCC)    Constipation    Diastolic dysfunction 01/04/2020   a.) TTE 01/04/2020: EF 60-65%, mild LVH, triv MR, mild-mod AoV sclerosis without stenosis, G1DD.   Diverticulosis    Dyspnea    Heart murmur    History of colonoscopy    2019 in [place]; patient states no longer screening for colon cancer.   Hypertension    IBS (irritable bowel syndrome)    Lower back pain    Pneumonia    PONV (postoperative nausea and vomiting)    Pre-diabetes    Renal cyst    Right leg DVT (HCC)    Past Surgical History:  Procedure Laterality Date   ABDOMINAL HYSTERECTOMY     unsure if has ovaries.    BOWEL RESECTION N/A 10/02/2022   Procedure: SMALL BOWEL RESECTION, open, possible colectomy, RNFA to assist;  Surgeon: Jordis Laneta FALCON, MD;  Location: [facility];  Service: General;  Laterality: N/A;   BREAST CYST EXCISION Bilateral    BREAST EXCISIONAL BIOPSY Bilateral late 80s, early 90s    benign   BREAST SURGERY     COLONOSCOPY     COLONOSCOPY WITH PROPOFOL  N/A 05/31/2022   Procedure: COLONOSCOPY WITH PROPOFOL ;  Surgeon: Unk Corinn Skiff, MD;  Location: [facility];  Service: Gastroenterology;  Laterality: N/A;   JOINT REPLACEMENT     REPLACEMENT TOTAL KNEE Left 2017    TOTAL KNEE ARTHROPLASTY Right 12/28/2021   Procedure: TOTAL KNEE ARTHROPLASTY;  Surgeon: Edie Norleen PARAS, MD;  Location: [facility];  Service: Orthopedics;  Laterality: Right;   Patient Active Problem List   Diagnosis Date Noted   Right groin pain 02/26/2024   Osteopenia 01/08/2024   Goals of care, counseling/discussion 10/16/2022   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   Mural thickening of colon    Polyp of ascending colon    Polyp of descending colon    Abnormal CT scan, colon 05/01/2022   Epigastric pain 04/02/2022   Status post total knee replacement using cement, 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   Lymphadenopathy 12/05/2021   Chronic kidney disease, stage 3a (HCC) 11/29/2021   Irritable 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 diarrhea 11/29/2021   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   LLQ pain 11/17/2021   OAB (overactive bladder) 12/07/2020   Dysuria 10/10/2020   Peripheral neuropathy 08/05/2020   Hand pain 08/05/2020   Rash 01/13/2020   Constipation 01/01/2020   Renal cyst 01/01/2020   Liver cyst 01/01/2020   Low back pain 01/01/2020   Abdominal pain 12/08/2019   HTN (hypertension) 10/28/2019   Prediabetes 10/28/2019   Right knee pain 10/28/2019  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   Nuclear sclerotic cataract of both eyes 10/15/2019   Bilateral knee pain 05/29/2019   PCP: Dineen Rollene MATSU, FNP  REFERRING PROVIDER: Dineen Rollene MATSU, FNP  REFERRING DIAG: Left knee pain, swelling   Rationale for Evaluation and Treatment: Rehabilitation  THERAPY DIAG:  Stiffness of left knee, not elsewhere classified  Difficulty in walking, not elsewhere classified  ONSET DATE: May 19, 2024   SUBJECTIVE:                                                                                                                                                                                            SUBJECTIVE STATEMENT: Pt says no pain today. She has remained busy with kitchen remodel projects. Cabinet painting is done.   PERTINENT HISTORY:  83yoF originally from [place], then [place] until 5 years ago presents with insidious onset Left knee swelling and pain, pt just previously DC from here for pain in bilat hips, groins, low back. Pt recently resumed a walking regimen, previously very active and on feet when living in [place], but less so after moving to [place].   PAIN:  Are you having pain?  no  PRECAUTIONS: None  WEIGHT BEARING RESTRICTIONS: No  FALLS:  Has patient fallen in last 6 months? No   PATIENT GOALS: Improve postural tolerance in bed, reduce waking pain.   OBJECTIVE:  Note: Objective measures were completed at evaluation unless otherwise noted.  -Rt patella circumference: 43cm, Left 41cm -10cm proximal: Rigth 46cm Left 43cm -5xSTS: 10sec hands free  - : self selected 9.53sec; fastest: 6.70sec  (1.50m/s)  -Rt trunk lean over RLE, not antalgic -Koos: 48%  -Strength assessnment 10RM testing in wellzone knee extension: 11.5 reps Right (90-0 degrees) 12 reps Left (1 plate)   TREATMENT DATE 06/30/24  -AA/ROM Nustep, level 3 x4  minutes (no pain) seat 7, arms 9  SUPERSET 1 - Hoist knee extension machine (seat #3, lever #4): 1x12-3 plates - Hoist knee flexion machine (seat #3, lever #15): 1x10-5 plates -wall lean ankle DF (double) 1x15 (heels 12 from wall for resistance grading) - standing single heel raises x10 (BUE support)   SUPERSET 2 - Hoist knee extension machine (seat #3, lever #4): 1x12-3 plates - Hoist knee flexion machine (seat #3, lever #15): 1x10-5 plates -wall lean ankle DF (double) 1x15 (heels 12 from wall for resistance grading) - standing single heel raises x10 (BUE support)  - standing hip ABDCT 1x12 bilat c 2.5lb AW - standing hip extension (SLR) hands on seat of chair: 1x12 @ 2.5lb AW bilat  -standing hip flexion 1x15 @  2.5lb AW c 2 hand  support    SUPERSET 3 - Hoist knee extension machine (seat #3, lever #4): 1x12-3 plates - Hoist knee flexion machine (seat #3, lever #15): 1x10-5 plates -wall lean ankle DF (double) 1x15 (heels 12 from wall for resistance grading) - standing single heel raises x10 (BUE support)  - standing hip ABDCT 1x12 bilat c 2.5lb AW - standing hip extension (SLR) hands on seat of chair: 1x12 @ 2.5lb AW bilat  -standing hip flexion 1x15 @ 2.5lb AW c 2 hand support                                                                                                                             PATIENT EDUCATION:  Education details: Still working on logistics for knee extension/flexion machine settings, quite complex, difficult to see.  Person educated: Valda  Education method: research scientist (medical), deliberate practice, positive reinforcement, explicit instruction, establish rules. Education comprehension: good   HOME EXERCISE PROGRAM: Access Code: [email or web address] URL: [email or web address] Date: 05/28/2024 Prepared by: Peggye Linear  Exercises - Long Arc Quad Right (with towel)   - 3-5 x daily - 1 sets - 15-20 reps - Seated Long Arc Quad  - 3 x daily - 1 sets - 15 reps - 3sech hold - Supine Quad Set  - 3 x daily - 1 sets - 15 reps - 3sec hold - Supine Knee Extension Strengthening  - 3 x daily - 1 sets - 15 reps - 3sec hold - Supine Bridge  - 3 x daily - 1 sets - 15 reps  ASSESSMENT:  CLINICAL IMPRESSION: Continued with resistance training program to advance focal knee strength bilat. Symptoms remain stable. Pt showing signs of excellent tolerance to the increase in volume of resistance training. Pt showing a bit more fatigue today despite same loading regimen. Pt able to progress from double to single heel raises today. Patient will benefit from skilled physical therapy intervention to reduce deficits and impairments identified in evaluation, in order to reduce pain, improve quality of life,  and maximize activity tolerance for ADL, IADL, and leisure/fitness. Physical therapy will help pt achieve long and short term goals of care.    OBJECTIVE IMPAIRMENTS: Decreased knowledge of condition, decreased use of DME, decreased mobility, difficulty walking, decreased strength, decreased ROM. ACTIVITY LIMITATIONS: Lifting, standing, walking, squatting, transfers, locomotion level PARTICIPATION LIMITATIONS: Cleaning, laundry, interpersonal relationships, driving, yardwork, community activity.  PERSONAL FACTORS: Age, behavior pattern, education, past/current experiences, transportation, profession  are also affecting patient's functional outcome.  REHAB POTENTIAL: Good CLINICAL DECISION MAKING: Medium  EVALUATION COMPLEXITY: Moderate   GOALS: Goals reviewed with patient? YES  SHORT TERM GOALS: Target date: 07/04/24  No pain during walking exercise in past week.   Baseline: unable to full participate in her walking without pain.  Goal status: NEW  2.  Pt to report consistent HEP performance with improvement in general flexibility and reduced LLE stiffness/pain.  Baseline:  Goal status: NEW  LONG TERM GOALS: Target date: 08/03/24  Pt to improve KOOS score by >15% to demonstrate reduced functional limitation from her knee.  Baseline: 48%  Goal status: NEW    2.  Pt to demonstrate improved strength in bilar knee extension AEB 10RM on level 4 or higher on knee extension machine in [employer] (hoist) from 90 to 0 degrees. (Single leg performance).  Baseline:  Goal status: NEW  3.  Pt to report ability to be on her feet for prolonged periods over a busy day without increased discomfort or swelling in left knee.  Baseline:    Goal status: NEW  4.  Pt to test at 5/5 MMT for bilat hip extension, IR, ER, and ABDCT, and 5/5 for bilat knee flexion and extension.  Baseline: 05/08/2024= Patient reports able to walk for 35+ min 4-5 days/weeks Goal status: NEW   PLAN:  PT FREQUENCY:  1-2x/week  PT DURATION: 8 weeks   PLANNED INTERVENTIONS: 97110-Therapeutic exercises, 97530- Therapeutic activity, 97112- Neuromuscular re-education, 97535- Self Care, 02859- Manual therapy, [phone]- Gait training, [phone]- Electrical stimulation (unattended), [phone]- Electrical stimulation (manual), Patient/Family education, Balance training, Stair training, Joint mobilization, Joint manipulation, Cryotherapy, and Moist heat.  PLAN FOR NEXT SESSION:  Continue to monitor knee pain Continue hip and knee loading exercises for improved strength    8:52 AM, 06/30/24 Peggye JAYSON Linear, PT, DPT Physical Therapist - [place] [facility]  Outpatient Physical Therapy- Main [facility] [phone]

## 2024-07-02 ENCOUNTER — Ambulatory Visit: Admitting: Physical Therapy

## 2024-07-02 ENCOUNTER — Encounter: Payer: Self-pay | Admitting: Physical Therapy

## 2024-07-02 DIAGNOSIS — M5459 Other low back pain: Secondary | ICD-10-CM

## 2024-07-02 DIAGNOSIS — R262 Difficulty in walking, not elsewhere classified: Secondary | ICD-10-CM

## 2024-07-02 DIAGNOSIS — M25662 Stiffness of left knee, not elsewhere classified: Secondary | ICD-10-CM | POA: Diagnosis not present

## 2024-07-02 DIAGNOSIS — M25551 Pain in right hip: Secondary | ICD-10-CM

## 2024-07-02 NOTE — Therapy (Signed)
 OUTPATIENT PHYSICAL THERAPY TREATMENT   Patient Name: Donna Ferguson MRN: [id number] DOB:02-16-1941, 83 y.o., female Today's Date: 07/02/2024  END OF SESSION:  PT End of Session - 07/02/24 0848     Visit Number 8    Number of Visits 16    PT Start Time 0848    PT Stop Time 0930    PT Time Calculation (min) 42 min    Activity Tolerance Patient tolerated treatment well          Past Medical History:  Diagnosis Date   Aortic atherosclerosis    Arthritis    Chronic kidney disease, stage 3a (HCC)    Constipation    Diastolic dysfunction 01/04/2020   a.) TTE 01/04/2020: EF 60-65%, mild LVH, triv MR, mild-mod AoV sclerosis without stenosis, G1DD.   Diverticulosis    Dyspnea    Heart murmur    History of colonoscopy    2019 in [place]; patient states no longer screening for colon cancer.   Hypertension    IBS (irritable bowel syndrome)    Lower back pain    Pneumonia    PONV (postoperative nausea and vomiting)    Pre-diabetes    Renal cyst    Right leg DVT (HCC)    Past Surgical History:  Procedure Laterality Date   ABDOMINAL HYSTERECTOMY     unsure if has ovaries.    BOWEL RESECTION N/A 10/02/2022   Procedure: SMALL BOWEL RESECTION, open, possible colectomy, RNFA to assist;  Surgeon: Jordis Laneta FALCON, MD;  Location: [facility];  Service: General;  Laterality: N/A;   BREAST CYST EXCISION Bilateral    BREAST EXCISIONAL BIOPSY Bilateral late 80s, early 90s    benign   BREAST SURGERY     COLONOSCOPY     COLONOSCOPY WITH PROPOFOL  N/A 05/31/2022   Procedure: COLONOSCOPY WITH PROPOFOL ;  Surgeon: Unk Corinn Skiff, MD;  Location: [facility];  Service: Gastroenterology;  Laterality: N/A;   JOINT REPLACEMENT     REPLACEMENT TOTAL KNEE Left 2017   TOTAL KNEE ARTHROPLASTY Right 12/28/2021   Procedure: TOTAL KNEE ARTHROPLASTY;  Surgeon: Edie Norleen PARAS, MD;  Location: [facility];  Service: Orthopedics;  Laterality: Right;   Patient Active Problem List   Diagnosis Date Noted    Right groin pain 02/26/2024   Osteopenia 01/08/2024   Goals of care, counseling/discussion 10/16/2022   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   Mural thickening of colon    Polyp of ascending colon    Polyp of descending colon    Abnormal CT scan, colon 05/01/2022   Epigastric pain 04/02/2022   Status post total knee replacement using cement, 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   Lymphadenopathy 12/05/2021   Chronic kidney disease, stage 3a (HCC) 11/29/2021   Irritable 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 diarrhea 11/29/2021   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   LLQ pain 11/17/2021   OAB (overactive bladder) 12/07/2020   Dysuria 10/10/2020   Peripheral neuropathy 08/05/2020   Hand pain 08/05/2020   Rash 01/13/2020   Constipation 01/01/2020   Renal cyst 01/01/2020   Liver cyst 01/01/2020   Low back pain 01/01/2020   Abdominal pain 12/08/2019   HTN (hypertension) 10/28/2019   Prediabetes 10/28/2019   Right knee pain 10/28/2019   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   Nuclear sclerotic cataract of both eyes 10/15/2019   Bilateral knee pain 05/29/2019   PCP: Dineen Rollene MATSU, FNP  REFERRING PROVIDER: Dineen Rollene MATSU, FNP  REFERRING  DIAG: Left knee pain, swelling   Rationale for Evaluation and Treatment: Rehabilitation  THERAPY DIAG:  Stiffness of left knee, not elsewhere classified  Difficulty in walking, not elsewhere classified  Pain of both hip joints  Other low back pain  ONSET DATE: May 19, 2024   SUBJECTIVE:                                                                                                                                                                                           SUBJECTIVE STATEMENT:  Pt reports that she is doing well today and can't complain. Pt reports she is feeling stronger and that her knees have been feeling better, and  that she is painting another room of her house this weekend.  PERTINENT HISTORY:  83yoF originally from [place], then [place] until 5 years ago presents with insidious onset Left knee swelling and pain, pt just previously DC from here for pain in bilat hips, groins, low back. Pt recently resumed a walking regimen, previously very active and on feet when living in [place], but less so after moving to [place].   PAIN:  Are you having pain?  No pain reported today  PRECAUTIONS: None  WEIGHT BEARING RESTRICTIONS: No  FALLS:  Has patient fallen in last 6 months? No   PATIENT GOALS: Improve postural tolerance in bed, reduce waking pain.   OBJECTIVE:  Note: Objective measures were completed at evaluation unless otherwise noted.  -Rt patella circumference: 43cm, Left 41cm -10cm proximal: Rigth 46cm Left 43cm -5xSTS: 10sec hands free  - : self selected 9.53sec; fastest: 6.70sec  (1.62m/s)  -Rt trunk lean over RLE, not antalgic -Koos: 48%  -Strength assessnment 10RM testing in wellzone knee extension: 11.5 reps Right (90-0 degrees) 12 reps Left (1 plate)   TREATMENT DATE 07/02/24  -Nustep reciprocal UE/LE movement pattern, level 4, seat 7, arms 7, -Hoist leg press machine, seat 6, level 4 1x10, level 5 2x10 -Single leg heel rasises 2x10 each leg, BUE support -B Hoist machine knee extension, seat 4, level 3, 2x10 -B Hoist machine knee flexion, seat 4, level 2 1x12, level 3 1x10 -TRX squats 2x10, CGA/ verbal cues and demonstration needed for proper form -wall dorsiflexion heel raises 2x10, 12in foot placement from wall -standing hip flexion 3# AW,  3x10 ea BUE support -standing hip abduction 2x15 ea, 3# AW, UUE support  Pt required CGA/supervision assist only during exercises unless otherwise stated.  PATIENT EDUCATION:  Education details: Still working on logistics for knee extension/flexion machine settings, quite complex, difficult to see.  Person educated: Laurella  Education method:  research scientist (medical), deliberate practice, positive reinforcement, explicit  instruction, establish rules. Education comprehension: good   HOME EXERCISE PROGRAM: Access Code: [email or web address] URL: [email or web address] Date: 05/28/2024 Prepared by: Peggye Linear  Exercises - Long Arc Quad Right (with towel)   - 3-5 x daily - 1 sets - 15-20 reps - Seated Long Arc Quad  - 3 x daily - 1 sets - 15 reps - 3sech hold - Supine Quad Set  - 3 x daily - 1 sets - 15 reps - 3sec hold - Supine Knee Extension Strengthening  - 3 x daily - 1 sets - 15 reps - 3sec hold - Supine Bridge  - 3 x daily - 1 sets - 15 reps  ASSESSMENT:  CLINICAL IMPRESSION: Pt arrived to session with good motivation for session today. Session continued to focus on lower extremity loading resistance program to improve B knee strength.  Pt reported no pain in today's session with symptoms remaining stable throughout. Attempted heel raises on Hoist leg press machine, but transitioned to standing unilateral heel raises after difficulty with form of movement on machine. Added squats on TRX bands in order to increase depth of squat with UE support. Pt reqiuired verbal and tactile cues as well as demonstration for proper form and correct muscle activation of squat exercise. Patient will benefit from skilled physical therapy intervention to reduce deficits and impairments identified in evaluation, in order to reduce pain, improve quality of life, and maximize activity tolerance for ADL, IADL, and leisure/fitness.     OBJECTIVE IMPAIRMENTS: Decreased knowledge of condition, decreased use of DME, decreased mobility, difficulty walking, decreased strength, decreased ROM. ACTIVITY LIMITATIONS: Lifting, standing, walking, squatting, transfers, locomotion level PARTICIPATION LIMITATIONS: Cleaning, laundry, interpersonal relationships, driving, yardwork, community activity.  PERSONAL FACTORS: Age, behavior pattern, education, past/current  experiences, transportation, profession  are also affecting patient's functional outcome.  REHAB POTENTIAL: Good CLINICAL DECISION MAKING: Medium  EVALUATION COMPLEXITY: Moderate   GOALS: Goals reviewed with patient? YES  SHORT TERM GOALS: Target date: 07/04/24  No pain during walking exercise in past week.   Baseline: unable to full participate in her walking without pain.  Goal status: NEW  2.  Pt to report consistent HEP performance with improvement in general flexibility and reduced LLE stiffness/pain.  Baseline:  Goal status: NEW  LONG TERM GOALS: Target date: 08/03/24  Pt to improve KOOS score by >15% to demonstrate reduced functional limitation from her knee.  Baseline: 48%  Goal status: NEW    2.  Pt to demonstrate improved strength in bilar knee extension AEB 10RM on level 4 or higher on knee extension machine in [employer] (hoist) from 90 to 0 degrees. (Single leg performance).  Baseline:  Goal status: NEW  3.  Pt to report ability to be on her feet for prolonged periods over a busy day without increased discomfort or swelling in left knee.  Baseline:    Goal status: NEW  4.  Pt to test at 5/5 MMT for bilat hip extension, IR, ER, and ABDCT, and 5/5 for bilat knee flexion and extension.  Baseline: 05/08/2024= Patient reports able to walk for 35+ min 4-5 days/weeks Goal status: NEW   PLAN:  PT FREQUENCY: 1-2x/week  PT DURATION: 8 weeks   PLANNED INTERVENTIONS: 97110-Therapeutic exercises, 97530- Therapeutic activity, 97112- Neuromuscular re-education, 97535- Self Care, 02859- Manual therapy, [phone]- Gait training, [phone]- Electrical stimulation (unattended), [phone]- Electrical stimulation (manual), Patient/Family education, Balance training, Stair training, Joint mobilization, Joint manipulation, Cryotherapy, and Moist heat.  PLAN FOR NEXT SESSION:   Continue to monitor knee pain Continue  hip and knee loading exercises for improved strength Initiate conversation for  d/c at visit 10    8:49 AM, 07/02/24 Renna Helling, SPT

## 2024-07-07 ENCOUNTER — Ambulatory Visit

## 2024-07-07 DIAGNOSIS — M25551 Pain in right hip: Secondary | ICD-10-CM

## 2024-07-07 DIAGNOSIS — M25662 Stiffness of left knee, not elsewhere classified: Secondary | ICD-10-CM

## 2024-07-07 DIAGNOSIS — M5459 Other low back pain: Secondary | ICD-10-CM

## 2024-07-07 DIAGNOSIS — R262 Difficulty in walking, not elsewhere classified: Secondary | ICD-10-CM

## 2024-07-07 NOTE — Therapy (Signed)
 OUTPATIENT PHYSICAL THERAPY TREATMENT   Patient Name: Donna Ferguson MRN: [id number] DOB:08-Nov-1940, 83 y.o., female Today's Date: 07/07/2024  END OF SESSION:  PT End of Session - 07/07/24 1027     Visit Number 9    Number of Visits 16    Date for Recertification  07/29/24    Authorization Type Medicare    Authorization Time Period 06/03/24-07/29/24    Progress Note Due on Visit 10    PT Start Time 1015    PT Stop Time 1055    PT Time Calculation (min) 40 min    Equipment Utilized During Treatment Gait belt    Activity Tolerance Patient tolerated treatment well;No increased pain    Behavior During Therapy WFL for tasks assessed/performed          Past Medical History:  Diagnosis Date   Aortic atherosclerosis    Arthritis    Chronic kidney disease, stage 3a (HCC)    Constipation    Diastolic dysfunction 01/04/2020   a.) TTE 01/04/2020: EF 60-65%, mild LVH, triv MR, mild-mod AoV sclerosis without stenosis, G1DD.   Diverticulosis    Dyspnea    Heart murmur    History of colonoscopy    2019 in [place]; patient states no longer screening for colon cancer.   Hypertension    IBS (irritable bowel syndrome)    Lower back pain    Pneumonia    PONV (postoperative nausea and vomiting)    Pre-diabetes    Renal cyst    Right leg DVT (HCC)    Past Surgical History:  Procedure Laterality Date   ABDOMINAL HYSTERECTOMY     unsure if has ovaries.    BOWEL RESECTION N/A 10/02/2022   Procedure: SMALL BOWEL RESECTION, open, possible colectomy, RNFA to assist;  Surgeon: Jordis Laneta FALCON, MD;  Location: [facility];  Service: General;  Laterality: N/A;   BREAST CYST EXCISION Bilateral    BREAST EXCISIONAL BIOPSY Bilateral late 80s, early 90s    benign   BREAST SURGERY     COLONOSCOPY     COLONOSCOPY WITH PROPOFOL  N/A 05/31/2022   Procedure: COLONOSCOPY WITH PROPOFOL ;  Surgeon: Unk Corinn Skiff, MD;  Location: [facility];  Service: Gastroenterology;  Laterality: N/A;   JOINT  REPLACEMENT     REPLACEMENT TOTAL KNEE Left 2017   TOTAL KNEE ARTHROPLASTY Right 12/28/2021   Procedure: TOTAL KNEE ARTHROPLASTY;  Surgeon: Edie Norleen PARAS, MD;  Location: [facility];  Service: Orthopedics;  Laterality: Right;   Patient Active Problem List   Diagnosis Date Noted   Right groin pain 02/26/2024   Osteopenia 01/08/2024   Goals of care, counseling/discussion 10/16/2022   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   Mural thickening of colon    Polyp of ascending colon    Polyp of descending colon    Abnormal CT scan, colon 05/01/2022   Epigastric pain 04/02/2022   Status post total knee replacement using cement, 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   Lymphadenopathy 12/05/2021   Chronic kidney disease, stage 3a (HCC) 11/29/2021   Irritable 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 diarrhea 11/29/2021   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   LLQ pain 11/17/2021   OAB (overactive bladder) 12/07/2020   Dysuria 10/10/2020   Peripheral neuropathy 08/05/2020   Hand pain 08/05/2020   Rash 01/13/2020   Constipation 01/01/2020   Renal cyst 01/01/2020   Liver cyst 01/01/2020   Low back pain 01/01/2020   Abdominal pain 12/08/2019   HTN (hypertension) 10/28/2019  Prediabetes 10/28/2019   Right knee pain 10/28/2019   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   Nuclear sclerotic cataract of both eyes 10/15/2019   Bilateral knee pain 05/29/2019   PCP: Dineen Rollene MATSU, FNP  REFERRING PROVIDER: Dineen Rollene MATSU, FNP  REFERRING DIAG: Left knee pain, swelling   Rationale for Evaluation and Treatment: Rehabilitation  THERAPY DIAG:  Stiffness of left knee, not elsewhere classified  Difficulty in walking, not elsewhere classified  Pain of both hip joints  Other low back pain  ONSET DATE: May 19, 2024   SUBJECTIVE:                                                                                                                                                                                            SUBJECTIVE STATEMENT: Pt remains busy with household projects. Knee and hip still feeling generally well.   PERTINENT HISTORY:  83yoF originally from [place], then [place] until 5 years ago presents with insidious onset Left knee swelling and pain, pt just previously DC from here for pain in bilat hips, groins, low back. Pt recently resumed a walking regimen, previously very active and on feet when living in [place], but less so after moving to [place].   PAIN:  Are you having pain?  No pain reported today  PRECAUTIONS: None  WEIGHT BEARING RESTRICTIONS: No  FALLS:  Has patient fallen in last 6 months? No   PATIENT GOALS: Improve postural tolerance in bed, reduce waking pain.   OBJECTIVE:  Note: Objective measures were completed at evaluation unless otherwise noted.  -Rt patella circumference: 43cm, Left 41cm -10cm proximal: Rigth 46cm Left 43cm -5xSTS: 10sec hands free  - : self selected 9.53sec; fastest: 6.70sec  (1.39m/s)  -Rt trunk lean over RLE, not antalgic -Koos: 48%  -Strength assessnment 10RM testing in [facility] knee extension: 11.5 reps Right (90-0 degrees) 12 reps Left (1 plate)   88/81/74: BLE Strength Assessment      Right  Left  Hip Flexion 5/5 5/5  Hip Horizontal ABDCT  5/5 5/5  Hip Horizontal ADD    Hip IR (seated)  5/5 5/5  Hip ER (seated) 5/5 5/5  Knee Extension 5/5 5/5  Knee Flexion (seated) 5/5 5/5  Hip ABDCT (supine)  5/5 5/5  Hip Extension (Supine @ 30 degrees) 4+/5 5/5     TREATMENT DATE 07/07/24  -1039ft AMB overground, no device, no pain, no SOB: 51m37s (1.31m/s)  -KOOS survey: 37%  (previously 48%)  -Rt patella circumference:  44Rt, 43.7Lt (@ eval 43cm, Left 41cm) -10cm proximal: 44.5Rt, 47.5Lt (@ Right 46cm Left 43cm) -5xSTS: 9.48sec hands free,  8.07sec hans free (10sec hands free at eval)  -MMT as above  -orientation to [facility] staff,  paperwork for joining.   PATIENT EDUCATION:  Education details: Plan to joint [facility] and conitnue with exercises 2x weekly x 6 months Person educated: Dreanna  Education method: collaborative learning, deliberate practice, positive reinforcement, explicit instruction, establish rules. Education comprehension: good   HOME EXERCISE PROGRAM: Access Code: [email or web address] URL: [email or web address] Date: 05/28/2024 Prepared by: Peggye Linear  Exercises - Long Arc Quad Right (with towel)   - 3-5 x daily - 1 sets - 15-20 reps - Seated Long Arc Quad  - 3 x daily - 1 sets - 15 reps - 3sech hold - Supine Quad Set  - 3 x daily - 1 sets - 15 reps - 3sec hold - Supine Knee Extension Strengthening  - 3 x daily - 1 sets - 15 reps - 3sec hold - Supine Bridge  - 3 x daily - 1 sets - 15 reps  ASSESSMENT:  CLINICAL IMPRESSION: Pt taken through reassessment of test and measures. Pt has made great success in functional activity tolerance since starting PT for knee pain. Pt has made great progress toward overall achievement of LT goals. Recommended pt continue with current program for next 6 months at a minimum, we discussed joining the [facility] out of her report of convenience in location. Pt reqiuired verbal and tactile cues as well as demonstration for proper form and correct muscle activation of squat exercise. Patient will benefit from skilled physical therapy intervention to reduce deficits and impairments identified in evaluation, in order to reduce pain, improve quality of life, and maximize activity tolerance for ADL, IADL, and leisure/fitness.     OBJECTIVE IMPAIRMENTS: Decreased knowledge of condition, decreased use of DME, decreased mobility, difficulty walking, decreased strength, decreased ROM. ACTIVITY LIMITATIONS: Lifting, standing, walking, squatting, transfers, locomotion level PARTICIPATION LIMITATIONS: Cleaning, laundry, interpersonal relationships, driving, yardwork, community  activity.  PERSONAL FACTORS: Age, behavior pattern, education, past/current experiences, transportation, profession  are also affecting patient's functional outcome.  REHAB POTENTIAL: Good CLINICAL DECISION MAKING: Medium  EVALUATION COMPLEXITY: Moderate   GOALS: Goals reviewed with patient? YES  SHORT TERM GOALS: Target date: 07/04/24  No pain during walking exercise in past week.   Baseline: unable to full participate in her walking without pain.  Goal status: NEW  2.  Pt to report consistent HEP performance with improvement in general flexibility and reduced LLE stiffness/pain.  Baseline:  Goal status: NEW  LONG TERM GOALS: Target date: 08/03/24  Pt to improve KOOS score by >15% to demonstrate reduced functional limitation from her knee.  Baseline: 48%; 07/07/24:  Goal status: PROGRESSING      2.  Pt to demonstrate improved strength in bilat knee extension AEB 10RM on level 4 or higher on knee extension machine in [facility] (hoist) from 90 to 0 degrees. (Single leg performance).  Baseline: 07/07/24:   Goal status: ACHIEVE  3.  Pt to report ability to be on her feet for prolonged periods over a busy day without increased discomfort or swelling in left knee.  Baseline:   07/07/24: has been on her feet quite a bit for 2 weeks while repainting kitchen.  Goal status: achieve   4.  Pt to test at 5/5 MMT for bilat hip extension, IR, ER, and ABDCT, and 5/5 for bilat knee flexion and extension.  Baseline: Goal status: ACHIEVED   PLAN:  PT FREQUENCY: 1-2x/week  PT DURATION: 8 weeks   PLANNED INTERVENTIONS: 97110-Therapeutic exercises, 97530- Therapeutic  activity, [id number]- Neuromuscular re-education, [phone]- Self Care, 02859- Manual therapy, [phone]- Gait training, [phone]- Electrical stimulation (unattended), [phone]- Electrical stimulation (manual), Patient/Family education, Balance training, Stair training, Joint mobilization, Joint manipulation, Cryotherapy, and Moist heat.  PLAN FOR  NEXT SESSION:   Give handouts for final [facility] program and monitor independent performance and machine setup  10:30 AM, 07/07/24 Peggye JAYSON Linear, PT, DPT Physical Therapist - [facility]  Outpatient Physical Therapy- Main Campus [phone]

## 2024-07-09 ENCOUNTER — Ambulatory Visit: Admitting: Physical Therapy

## 2024-07-13 ENCOUNTER — Ambulatory Visit

## 2024-07-13 DIAGNOSIS — M25662 Stiffness of left knee, not elsewhere classified: Secondary | ICD-10-CM | POA: Diagnosis not present

## 2024-07-13 DIAGNOSIS — R262 Difficulty in walking, not elsewhere classified: Secondary | ICD-10-CM

## 2024-07-13 NOTE — Therapy (Signed)
 OUTPATIENT PHYSICAL THERAPY TREATMENT   Patient Name: Donna Ferguson MRN: [id number] DOB:Dec 01, 1940, 83 y.o., female Today's Date: 07/13/2024  END OF SESSION:  PT End of Session - 07/13/24 0857     Visit Number 10    Number of Visits 16    Date for Recertification  07/29/24    Authorization Type Medicare    Authorization Time Period 06/03/24-07/29/24    Progress Note Due on Visit 10    PT Start Time 0847    PT Stop Time 0927    PT Time Calculation (min) 40 min    Equipment Utilized During Treatment Gait belt    Activity Tolerance Patient tolerated treatment well;No increased pain    Behavior During Therapy WFL for tasks assessed/performed          Past Medical History:  Diagnosis Date   Aortic atherosclerosis    Arthritis    Chronic kidney disease, stage 3a (HCC)    Constipation    Diastolic dysfunction 01/04/2020   a.) TTE 01/04/2020: EF 60-65%, mild LVH, triv MR, mild-mod AoV sclerosis without stenosis, G1DD.   Diverticulosis    Dyspnea    Heart murmur    History of colonoscopy    2019 in [place]; patient states no longer screening for colon cancer.   Hypertension    IBS (irritable bowel syndrome)    Lower back pain    Pneumonia    PONV (postoperative nausea and vomiting)    Pre-diabetes    Renal cyst    Right leg DVT (HCC)    Past Surgical History:  Procedure Laterality Date   ABDOMINAL HYSTERECTOMY     unsure if has ovaries.    BOWEL RESECTION N/A 10/02/2022   Procedure: SMALL BOWEL RESECTION, open, possible colectomy, RNFA to assist;  Surgeon: Donna Laneta FALCON, MD;  Location: [facility];  Service: General;  Laterality: N/A;   BREAST CYST EXCISION Bilateral    BREAST EXCISIONAL BIOPSY Bilateral late 80s, early 90s    benign   BREAST SURGERY     COLONOSCOPY     COLONOSCOPY WITH PROPOFOL  N/A 05/31/2022   Procedure: COLONOSCOPY WITH PROPOFOL ;  Surgeon: Unk Donna Skiff, MD;  Location: [facility];  Service: Gastroenterology;  Laterality: N/A;   JOINT  REPLACEMENT     REPLACEMENT TOTAL KNEE Left 2017   TOTAL KNEE ARTHROPLASTY Right 12/28/2021   Procedure: TOTAL KNEE ARTHROPLASTY;  Surgeon: Donna Norleen PARAS, MD;  Location: [facility];  Service: Orthopedics;  Laterality: Right;   Patient Active Problem List   Diagnosis Date Noted   Right groin pain 02/26/2024   Osteopenia 01/08/2024   Goals of care, counseling/discussion 10/16/2022   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   Mural thickening of colon    Polyp of ascending colon    Polyp of descending colon    Abnormal CT scan, colon 05/01/2022   Epigastric pain 04/02/2022   Status post total knee replacement using cement, 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   Lymphadenopathy 12/05/2021   Chronic kidney disease, stage 3a (HCC) 11/29/2021   Irritable 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 diarrhea 11/29/2021   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   LLQ pain 11/17/2021   OAB (overactive bladder) 12/07/2020   Dysuria 10/10/2020   Peripheral neuropathy 08/05/2020   Hand pain 08/05/2020   Rash 01/13/2020   Constipation 01/01/2020   Renal cyst 01/01/2020   Liver cyst 01/01/2020   Low back pain 01/01/2020   Abdominal pain 12/08/2019   HTN (hypertension) 10/28/2019  Prediabetes 10/28/2019   Right knee pain 10/28/2019   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   Nuclear sclerotic cataract of both eyes 10/15/2019   Bilateral knee pain 05/29/2019   PCP: Donna Rollene MATSU, FNP  REFERRING PROVIDER: Dineen Rollene MATSU, FNP  REFERRING DIAG: Left knee pain, swelling   Rationale for Evaluation and Treatment: Rehabilitation  THERAPY DIAG:  Stiffness of left knee, not elsewhere classified  Difficulty in walking, not elsewhere classified  ONSET DATE: May 19, 2024   SUBJECTIVE:                                                                                                                                                                                            SUBJECTIVE STATEMENT: Pt denies any updates since last session. Pt doing well in general. Will join [facility] tday.   PERTINENT HISTORY:  83yoF originally from [place], then [place] until 5 years ago presents with insidious onset Left knee swelling and pain, pt just previously DC from here for pain in bilat hips, groins, low back. Pt recently resumed a walking regimen, previously very active and on feet when living in [place], but less so after moving to [place].   PAIN:  Are you having pain?  No pain reported today  PRECAUTIONS: None  WEIGHT BEARING RESTRICTIONS: No  FALLS:  Has patient fallen in last 6 months? No   PATIENT GOALS: Improve postural tolerance in bed, reduce waking pain.   OBJECTIVE:  Note: Objective measures were completed at evaluation unless otherwise noted.  -Rt patella circumference: 43cm, Left 41cm -10cm proximal: Rigth 46cm Left 43cm -5xSTS: 10sec hands free  - : self selected 9.53sec; fastest: 6.70sec  (1.65m/s)  -Rt trunk lean over RLE, not antalgic -Koos: 48%  -Strength assessnment 10RM testing in [facility] knee extension: 11.5 reps Right (90-0 degrees) 12 reps Left (1 plate)   88/81/74: BLE Strength Assessment      Right  Left  Hip Flexion 5/5 5/5  Hip Horizontal ABDCT  5/5 5/5  Hip Horizontal ADD    Hip IR (seated)  5/5 5/5  Hip ER (seated) 5/5 5/5  Knee Extension 5/5 5/5  Knee Flexion (seated) 5/5 5/5  Hip ABDCT (supine)  5/5 5/5  Hip Extension (Supine @ 30 degrees) 4+/5 5/5     TREATMENT DATE 07/13/24  -issued final handout  -pt sets up leg extension machine  -2 sets of 12 @ level 3 (seat 3, lever 4)  -pt sets up hamstrings curl machine -2 sets of 15 @ level 5, (seat 3, level 15) -single leg heel raises 2x10 bilat  -leaning against wall double  toe raises 2x15  -pt ties red TB around knees -standing SLR hip extension 2x15 bilat -standing SLR hip ABDCT 2x15 bilat  -review of hand out with  notes made   PATIENT EDUCATION:  Education details: Plan to joint [facility] and conitnue with exercises 2x weekly x 6 months Person educated: Donna Ferguson  Education method: research scientist (medical), deliberate practice, positive reinforcement, explicit instruction, establish rules. Education comprehension: good   HOME EXERCISE PROGRAM: Access Code: [email or web address] URL: [email or web address] Date: 05/28/2024 Prepared by: Peggye Linear  Exercises - Long Arc Quad Right (with towel)   - 3-5 x daily - 1 sets - 15-20 reps - Seated Long Arc Quad  - 3 x daily - 1 sets - 15 reps - 3sech hold - Supine Quad Set  - 3 x daily - 1 sets - 15 reps - 3sec hold - Supine Knee Extension Strengthening  - 3 x daily - 1 sets - 15 reps - 3sec hold - Supine Bridge  - 3 x daily - 1 sets - 15 reps  ASSESSMENT:  CLINICAL IMPRESSION: Pt given final DC handout complete with pictures of machines and number settings for seat adjustments. Pt takes time and focus and requires some intermittent assistance with all the machine adjustments and corresponding it to paper. Sets/reps/resistance all taken from recent sessions, no increase specific to today. Pt plans to finalize membership at [facility] and being independent workouts, will FU for 1 more visit prior to plan for DC. Patient will benefit from skilled physical therapy intervention to reduce deficits and impairments identified in evaluation, in order to reduce pain, improve quality of life, and maximize activity tolerance for ADL, IADL, and leisure/fitness.    OBJECTIVE IMPAIRMENTS: Decreased knowledge of condition, decreased use of DME, decreased mobility, difficulty walking, decreased strength, decreased ROM. ACTIVITY LIMITATIONS: Lifting, standing, walking, squatting, transfers, locomotion level PARTICIPATION LIMITATIONS: Cleaning, laundry, interpersonal relationships, driving, yardwork, community activity.  PERSONAL FACTORS: Age, behavior pattern, education,  past/current experiences, transportation, profession  are also affecting patient's functional outcome.  REHAB POTENTIAL: Good CLINICAL DECISION MAKING: Medium  EVALUATION COMPLEXITY: Moderate   GOALS: Goals reviewed with patient? YES  SHORT TERM GOALS: Target date: 07/04/24  No pain during walking exercise in past week.   Baseline: unable to full participate in her walking without pain.  Goal status: NEW  2.  Pt to report consistent HEP performance with improvement in general flexibility and reduced LLE stiffness/pain.  Baseline:  Goal status: NEW  LONG TERM GOALS: Target date: 08/03/24  Pt to improve KOOS score by >15% to demonstrate reduced functional limitation from her knee.  Baseline: 48%; 07/07/24: 37% Goal status: PROGRESSING      2.  Pt to demonstrate improved strength in bilat knee extension AEB 10RM on level 4 or higher on knee extension machine in [facility] (hoist) from 90 to 0 degrees. (Single leg performance).  Baseline: 07/07/24:   Goal status: ACHIEVED  3.  Pt to report ability to be on her feet for prolonged periods over a busy day without increased discomfort or swelling in left knee.  Baseline:   07/07/24: has been on her feet quite a bit for 2 weeks while repainting kitchen.  Goal status: ACHIEVED   4.  Pt to test at 5/5 MMT for bilat hip extension, IR, ER, and ABDCT, and 5/5 for bilat knee flexion and extension.  Baseline: Goal status: ACHIEVED   PLAN:  PT FREQUENCY: 1-2x/week  PT DURATION: 8 weeks   PLANNED INTERVENTIONS: 97110-Therapeutic exercises, 97530- Therapeutic activity, 97112-  Neuromuscular re-education, [phone]- Self Care, 02859- Manual therapy, [phone]- Gait training, [phone]- Electrical stimulation (unattended), [phone]- Electrical stimulation (manual), Patient/Family education, Balance training, Stair training, Joint mobilization, Joint manipulation, Cryotherapy, and Moist heat.  PLAN FOR NEXT SESSION:  Review [facility] program and success since  joining, aim for DC or address questions    8:59 AM, 07/13/24 Peggye JAYSON Linear, PT, DPT Physical Therapist - [facility]  Outpatient Physical Therapy- Main Campus [phone]

## 2024-07-15 ENCOUNTER — Ambulatory Visit

## 2024-07-21 ENCOUNTER — Ambulatory Visit

## 2024-07-21 DIAGNOSIS — R262 Difficulty in walking, not elsewhere classified: Secondary | ICD-10-CM | POA: Diagnosis present

## 2024-07-21 DIAGNOSIS — M25662 Stiffness of left knee, not elsewhere classified: Secondary | ICD-10-CM | POA: Diagnosis present

## 2024-07-21 DIAGNOSIS — M25551 Pain in right hip: Secondary | ICD-10-CM | POA: Insufficient documentation

## 2024-07-21 DIAGNOSIS — M25552 Pain in left hip: Secondary | ICD-10-CM | POA: Diagnosis present

## 2024-07-21 DIAGNOSIS — M5459 Other low back pain: Secondary | ICD-10-CM | POA: Insufficient documentation

## 2024-07-21 NOTE — Therapy (Signed)
 OUTPATIENT PHYSICAL THERAPY TREATMENT/DISCHARGE  Patient Name: Donna Ferguson MRN: [id number] DOB:1941-01-17, 83 y.o., female Today's Date: 07/21/2024  END OF SESSION:  PT End of Session - 07/21/24 0841     Visit Number 11    Number of Visits 16    Date for Recertification  07/29/24    Authorization Type Medicare    Authorization Time Period 06/03/24-07/29/24    Progress Note Due on Visit 10    Equipment Utilized During Treatment Gait belt    Activity Tolerance Patient tolerated treatment well;No increased pain    Behavior During Therapy WFL for tasks assessed/performed          Past Medical History:  Diagnosis Date   Aortic atherosclerosis    Arthritis    Chronic kidney disease, stage 3a (HCC)    Constipation    Diastolic dysfunction 01/04/2020   a.) TTE 01/04/2020: EF 60-65%, mild LVH, triv MR, mild-mod AoV sclerosis without stenosis, G1DD.   Diverticulosis    Dyspnea    Heart murmur    History of colonoscopy    2019 in [place]; patient states no longer screening for colon cancer.   Hypertension    IBS (irritable bowel syndrome)    Lower back pain    Pneumonia    PONV (postoperative nausea and vomiting)    Pre-diabetes    Renal cyst    Right leg DVT (HCC)    Past Surgical History:  Procedure Laterality Date   ABDOMINAL HYSTERECTOMY     unsure if has ovaries.    BOWEL RESECTION N/A 10/02/2022   Procedure: SMALL BOWEL RESECTION, open, possible colectomy, RNFA to assist;  Surgeon: Jordis Laneta FALCON, MD;  Location: [facility];  Service: General;  Laterality: N/A;   BREAST CYST EXCISION Bilateral    BREAST EXCISIONAL BIOPSY Bilateral late 80s, early 90s    benign   BREAST SURGERY     COLONOSCOPY     COLONOSCOPY WITH PROPOFOL  N/A 05/31/2022   Procedure: COLONOSCOPY WITH PROPOFOL ;  Surgeon: Unk Corinn Skiff, MD;  Location: [facility];  Service: Gastroenterology;  Laterality: N/A;   JOINT REPLACEMENT     REPLACEMENT TOTAL KNEE Left 2017   TOTAL KNEE ARTHROPLASTY  Right 12/28/2021   Procedure: TOTAL KNEE ARTHROPLASTY;  Surgeon: Edie Norleen PARAS, MD;  Location: [facility];  Service: Orthopedics;  Laterality: Right;   Patient Active Problem List   Diagnosis Date Noted   Right groin pain 02/26/2024   Osteopenia 01/08/2024   Goals of care, counseling/discussion 10/16/2022   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   Mural thickening of colon    Polyp of ascending colon    Polyp of descending colon    Abnormal CT scan, colon 05/01/2022   Epigastric pain 04/02/2022   Status post total knee replacement using cement, 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   Lymphadenopathy 12/05/2021   Chronic kidney disease, stage 3a (HCC) 11/29/2021   Irritable 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 diarrhea 11/29/2021   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   LLQ pain 11/17/2021   OAB (overactive bladder) 12/07/2020   Dysuria 10/10/2020   Peripheral neuropathy 08/05/2020   Hand pain 08/05/2020   Rash 01/13/2020   Constipation 01/01/2020   Renal cyst 01/01/2020   Liver cyst 01/01/2020   Low back pain 01/01/2020   Abdominal pain 12/08/2019   HTN (hypertension) 10/28/2019   Prediabetes 10/28/2019   Right knee pain 10/28/2019   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  ONSET DATE: May 19, 2024   SUBJECTIVE:                                                                                                                                                                                           SUBJECTIVE STATEMENT: Pt remains doing well today with no pain in the L knee upon arrival. She  arrived with her printed HEP from previous visit with handwritten notes for machine set up as well as a theraband for her TB exercises.   PERTINENT HISTORY:  83yoF originally from [place], then [place] until 5 years ago presents with insidious onset Left knee swelling and pain, pt just previously DC from here for pain in bilat hips, groins, low back. Pt recently resumed a walking regimen, previously very active and on feet when living in [place], but less so after moving to [place].   PAIN:  Are you having pain?  No pain reported today  PRECAUTIONS: None  WEIGHT BEARING RESTRICTIONS: No  FALLS:  Has patient fallen in last 6 months? No   PATIENT GOALS: Improve postural tolerance in bed, reduce waking pain.   OBJECTIVE:  Note: Objective measures were completed at evaluation unless otherwise noted.  -Rt patella circumference: 43cm, Left 41cm -10cm proximal: Rigth 46cm Left 43cm -5xSTS: 10sec hands free  - : self selected 9.53sec; fastest: 6.70sec  (1.60m/s)  -Rt trunk lean over RLE, not antalgic -Koos: 48%  -Strength assessnment 10RM testing in [facility] knee extension: 11.5 reps Right (90-0 degrees) 12 reps Left (1 plate)   88/81/74: BLE Strength Assessment      Right  Left  Hip Flexion 5/5 5/5  Hip Horizontal ABDCT  5/5 5/5  Hip Horizontal ADD    Hip IR (seated)  5/5 5/5  Hip ER (seated) 5/5 5/5  Knee Extension 5/5 5/5  Knee Flexion (seated) 5/5 5/5  Hip ABDCT (supine)  5/5 5/5  Hip Extension (Supine @ 30 degrees) 4+/5 5/5     TREATMENT DATE 07/21/24   -Patient used final handout received at previous visit to complete her exercises today.  -pt sets up leg extension machine (did need slight assist with seat adjustment).  -3 sets of 12 @ level 3 (seat 3, lever 4)   -pt sets up hamstrings curl machine (Very slight verbal cues required as she had seat and leg settings backwards).  -  2 sets of 15 @ level 5, (seat 3, level 15)  -single leg heel raises 3x10 bilat   -leaning against  wall double toe raises 2x15  -pt ties red TB around knees  -standing SLR hip extension 2x15 bilat  -standing SLR hip ABDCT 2x15 bilat   -review of hand out with notes made   PATIENT EDUCATION:  Education details: Plan to joint [facility] and conitnue with exercises 2x weekly x 6 months Person educated: Liannah  Education method: research scientist (medical), deliberate practice, positive reinforcement, explicit instruction, establish rules. Education comprehension: good   HOME EXERCISE PROGRAM: Access Code: [email or web address] URL: [email or web address] Date: 05/28/2024 Prepared by: Peggye Linear  Exercises - Long Arc Quad Right (with towel)   - 3-5 x daily - 1 sets - 15-20 reps - Seated Long Arc Quad  - 3 x daily - 1 sets - 15 reps - 3sech hold - Supine Quad Set  - 3 x daily - 1 sets - 15 reps - 3sec hold - Supine Knee Extension Strengthening  - 3 x daily - 1 sets - 15 reps - 3sec hold - Supine Bridge  - 3 x daily - 1 sets - 15 reps  ASSESSMENT:  CLINICAL IMPRESSION: Patient performed today's treatment session in the [facility], performing the session almost completely independently in preparation for discharge to HEP following today's treatment. She reports she is in process of getting access to [facility] and gave her paperwork to the [employer] employee last week. She reports she is ready to discharge at this time as she is confident in her ability to continue her HEP at home and in [place]. Patient performed her exercises well today with almost complete independence only requiring verbal cues for minor tasks/corrections with machine set ups. She was able to show good mechanics/positioning throughout entire treatment session. I did educate patient on different areas of the [facility] to perform her exercises if it were busier/her exercise stations were occupied. Patient progressed well overall throughout her POC meeting most goals and is satisfied with her progress she made. She will be  discharged at this time with most goals met and ability to continue her HEP independently.     OBJECTIVE IMPAIRMENTS: Decreased knowledge of condition, decreased use of DME, decreased mobility, difficulty walking, decreased strength, decreased ROM. ACTIVITY LIMITATIONS: Lifting, standing, walking, squatting, transfers, locomotion level PARTICIPATION LIMITATIONS: Cleaning, laundry, interpersonal relationships, driving, yardwork, community activity.  PERSONAL FACTORS: Age, behavior pattern, education, past/current experiences, transportation, profession  are also affecting patient's functional outcome.  REHAB POTENTIAL: Good CLINICAL DECISION MAKING: Medium  EVALUATION COMPLEXITY: Moderate   GOALS: Goals reviewed with patient? YES  SHORT TERM GOALS: Target date: 07/04/24  No pain during walking exercise in past week.   Baseline: unable to full participate in her walking without pain.  Goal status: NEW  2.  Pt to report consistent HEP performance with improvement in general flexibility and reduced LLE stiffness/pain.  Baseline:  Goal status: NEW  LONG TERM GOALS: Target date: 08/03/24  Pt to improve KOOS score by >15% to demonstrate reduced functional limitation from her knee.  Baseline: 48%; 07/07/24:  Goal status: PROGRESSING      2.  Pt to demonstrate improved strength in bilat knee extension AEB 10RM on level 4 or higher on knee extension machine in [facility] (hoist) from 90 to 0 degrees. (Single leg performance).  Baseline: 07/07/24:   Goal status: ACHIEVE  3.  Pt to report ability to be on her feet for prolonged periods over a busy  day without increased discomfort or swelling in left knee.  Baseline:   07/07/24: has been on her feet quite a bit for 2 weeks while repainting kitchen.  Goal status: achieve   4.  Pt to test at 5/5 MMT for bilat hip extension, IR, ER, and ABDCT, and 5/5 for bilat knee flexion and extension.  Baseline: Goal status: ACHIEVED   PLAN:  PT  FREQUENCY: 1-2x/week  PT DURATION: 8 weeks   PLANNED INTERVENTIONS: 97110-Therapeutic exercises, 97530- Therapeutic activity, [id number]- Neuromuscular re-education, 97535- Self Care, [id number]- Manual therapy, [phone]- Gait training, [phone]- Electrical stimulation (unattended), [phone]- Electrical stimulation (manual), Patient/Family education, Balance training, Stair training, Joint mobilization, Joint manipulation, Cryotherapy, and Moist heat.  PLAN FOR NEXT SESSION:  Discharge at this time to independent HEP at [facility].  9:02 AM, 07/21/24 Norman Sharps, PT, DPT Physical Therapist - [facility]  Outpatient Physical Therapy- Main Campus [phone]

## 2024-07-23 ENCOUNTER — Ambulatory Visit

## 2024-07-28 ENCOUNTER — Ambulatory Visit

## 2024-07-30 ENCOUNTER — Ambulatory Visit

## 2024-08-04 ENCOUNTER — Ambulatory Visit

## 2024-08-06 ENCOUNTER — Ambulatory Visit

## 2024-08-06 ENCOUNTER — Ambulatory Visit: Admitting: Physical Therapy

## 2024-08-11 ENCOUNTER — Ambulatory Visit

## 2024-08-11 ENCOUNTER — Ambulatory Visit: Admitting: Physical Therapy

## 2024-08-17 ENCOUNTER — Ambulatory Visit

## 2024-08-19 ENCOUNTER — Ambulatory Visit

## 2024-08-21 ENCOUNTER — Ambulatory Visit
Admission: RE | Admit: 2024-08-21 | Discharge: 2024-08-21 | Disposition: A | Discharge status: Home / Self Care | Payer: Medicare Other | Source: Ambulatory Visit | Attending: Oncology | Admitting: Oncology

## 2024-08-21 DIAGNOSIS — C7A8 Other malignant neuroendocrine tumors: Secondary | ICD-10-CM | POA: Insufficient documentation

## 2024-08-21 MED ORDER — COPPER CU 64 DOTATATE 1 MCI/ML IV SOLN
4.0000 | Freq: Once | INTRAVENOUS | Status: AC
  Administered 2024-08-21: 4.4 via INTRAVENOUS

## 2024-08-25 ENCOUNTER — Ambulatory Visit

## 2024-08-27 ENCOUNTER — Ambulatory Visit

## 2024-09-01 ENCOUNTER — Ambulatory Visit

## 2024-09-03 ENCOUNTER — Ambulatory Visit

## 2024-09-04 ENCOUNTER — Telehealth: Payer: Self-pay | Admitting: Oncology

## 2024-09-04 ENCOUNTER — Inpatient Hospital Stay: Payer: Medicare Other | Attending: Oncology | Admitting: Oncology

## 2024-09-04 ENCOUNTER — Inpatient Hospital Stay

## 2024-09-04 ENCOUNTER — Encounter: Payer: Self-pay | Admitting: Oncology

## 2024-09-04 VITALS — BP 138/74 | HR 73 | Temp 97.1°F | Resp 18 | Wt 152.8 lb

## 2024-09-04 DIAGNOSIS — C7A8 Other malignant neuroendocrine tumors: Secondary | ICD-10-CM

## 2024-09-04 DIAGNOSIS — C7A019 Malignant carcinoid tumor of the small intestine, unspecified portion: Secondary | ICD-10-CM | POA: Insufficient documentation

## 2024-09-04 LAB — CBC WITH DIFFERENTIAL/PLATELET
Abs Immature Granulocytes: 0.04 K/uL (ref 0.00–0.07)
Basophils Absolute: 0 K/uL (ref 0.0–0.1)
Basophils Relative: 0 %
Eosinophils Absolute: 0.1 K/uL (ref 0.0–0.5)
Eosinophils Relative: 1 %
HCT: 39.6 % (ref 36.0–46.0)
Hemoglobin: 13 g/dL (ref 12.0–15.0)
Immature Granulocytes: 0 %
Lymphocytes Relative: 34 %
Lymphs Abs: 3.3 K/uL (ref 0.7–4.0)
MCH: 29.1 pg (ref 26.0–34.0)
MCHC: 32.8 g/dL (ref 30.0–36.0)
MCV: 88.8 fL (ref 80.0–100.0)
Monocytes Absolute: 0.8 K/uL (ref 0.1–1.0)
Monocytes Relative: 8 %
Neutro Abs: 5.6 K/uL (ref 1.7–7.7)
Neutrophils Relative %: 57 %
Platelets: 279 K/uL (ref 150–400)
RBC: 4.46 MIL/uL (ref 3.87–5.11)
RDW: 13.5 % (ref 11.5–15.5)
WBC: 9.8 K/uL (ref 4.0–10.5)
nRBC: 0 % (ref 0.0–0.2)

## 2024-09-04 LAB — COMPREHENSIVE METABOLIC PANEL WITH GFR
ALT: 13 U/L (ref 0–44)
AST: 24 U/L (ref 15–41)
Albumin: 4.2 g/dL (ref 3.5–5.0)
Alkaline Phosphatase: 108 U/L (ref 38–126)
Anion gap: 11 (ref 5–15)
BUN: 23 mg/dL (ref 8–23)
CO2: 23 mmol/L (ref 22–32)
Calcium: 9.9 mg/dL (ref 8.9–10.3)
Chloride: 102 mmol/L (ref 98–111)
Creatinine, Ser: 0.91 mg/dL (ref 0.44–1.00)
GFR, Estimated: >60 mL/min
Glucose, Bld: 105 mg/dL — ABNORMAL HIGH (ref 70–99)
Potassium: 3.8 mmol/L (ref 3.5–5.1)
Sodium: 136 mmol/L (ref 135–145)
Total Bilirubin: 0.4 mg/dL (ref 0.0–1.2)
Total Protein: 7.4 g/dL (ref 6.5–8.1)

## 2024-09-04 NOTE — Assessment & Plan Note (Addendum)
 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  Repeat chromogranin a level is chronically elevated. Discussed with the patient that chromogranin A can be affected by her acid reflux medication, food, chronic inflammation i.e. arthritis.  No dotatate avid disease on recent dotatate PET scan.  However Two new small ileocecal nodes without activity.  Recommend observation and repeat dotatate PET scan in 6 months

## 2024-09-04 NOTE — Progress Notes (Signed)
 " Hematology/Oncology Progress note [phone] [phone]     CHIEF COMPLAINTS/REASON FOR VISIT:  Small bowel neuroendocrine carcinoma.  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  Repeat chromogranin a level is chronically elevated. Discussed with the patient that chromogranin A can be affected by her acid reflux medication, food, chronic inflammation i.e. arthritis.  No dotatate avid disease on recent dotatate PET scan.  However Two new small ileocecal nodes without activity.  Recommend observation and repeat dotatate PET scan in 6 months     Orders Placed This Encounter  Procedures   NM PET DOTATATE SKULL BASE TO MID THIGH    Standing Status:   Future    Expected Date:   03/04/2025    Expiration Date:   09/04/2025    If indicated for the ordered procedure, I authorize the administration of a radiopharmaceutical per Radiology protocol:   Yes    Preferred imaging location?:   [facility]   Comprehensive metabolic panel with GFR    Standing Status:   Future    Number of Occurrences:   1    Expected Date:   09/04/2024    Expiration Date:   12/03/2024   CBC with Differential/Platelet    Standing Status:   Future    Number of Occurrences:   1    Expected Date:   09/04/2024    Expiration Date:   12/03/2024   Chromogranin A    Standing Status:   Future    Number of Occurrences:   1    Expected Date:   09/04/2024    Expiration Date:   12/03/2024   5 HIAA, quantitative, urine, 24 hour    Standing Status:   Future    Expected Date:   09/04/2024    Expiration Date:   12/03/2024   Follow-up in 6 months.  We spent sufficient time to discuss many aspect of care, questions were answered to patient's satisfaction. A total of 25 minutes was spent on this visit.  With 5 minutes spent reviewing image findings,15 minutes counseling the patient on the  management plan.   Additional 5 minutes was spent on answering patient's questions.  All questions were answered. The patient knows to call the clinic with any problems, questions or concerns.  Zelphia Cap, MD, PhD [facility] Hematology Oncology 09/04/2024     HISTORY OF PRESENTING ILLNESS:   Oncology History  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HCC)  11/17/2021 Imaging   CT abdomen pelvis without contrast was obtained for further evaluation.  CT showed no acute abnormality of the abdomen or pelvis.  Scattered sigmoid colonic diverticulosis without findings of acute diverticulitis.  Nonspecific prominent/mildly enlarged lymph nodes along the mesenteric root measuring up to 13 mm in short axis previously measuring up to 9 mm.  Constipation.     05/01/2022 Imaging   CT abdomen pelvis without contrast 1. New circumferential wall thickening of the right colon suspicious for malignancy. 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 2. 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 3. No evidence of bowel obstruction or perforation. 4. No definite signs of extranodal metastatic disease on noncontrast imaging. 5. Further evaluation with colonoscopy and/or PET-CT recommended.   05/31/2022 Procedure   Colonoscopy showed multiple polyps resected. Tubular adenoma. No malignancy    06/18/2022 Imaging   PET scan showed  1. No abnormal radiotracer uptake identified within the enlarged mesenteric lymph nodes. Etiology of the enlarged lymph nodes is indeterminate. If there is biochemical concern for indolent neoplastic process such as carcinoid tumor consider further evaluation with DOTATATE PET-CT. 2. No abnormal radiotracer uptake identified within the small or large bowel loops. 3. Right renal cyst. 4. Coronary artery calcifications. 5.  Aortic Atherosclerosis    07/11/2022 Tumor Marker   Chromogranin A level  191.5 24-hour 5 HIAA level was ordered, patient did not submit urine sample for analysis prior to the surgery   08/02/2022 Imaging   Dotatate PET scan showed 1. Multiple intensely radiotracer avid small bowel lesions which are essentially occult by noncontrast CT imaging. Favor multifocal small bowel neuroendocrine tumor (well differentiated). No evidence of bowel obstruction. 2. Several central mesenteric intensely radiotracer avid well differentiated neuroendocrine tumor metastasis. 3. No liver metastasis or distant metastatic disease. 4. No neuroendocrine tumor identified within the colon   10/02/2022 Initial Diagnosis   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HCC)   10/02/2022 Cancer Staging   Staging form: Small Intestine - Other Histologies, AJCC 8th Edition - Pathologic stage from 10/02/2022: pT4, pN2, cM0 - Signed by Babara Call, MD on 10/16/2022 Stage prefix: Initial diagnosis Total positive nodes: 33 Total nodes examined: 59 Histologic grade (G): G2 Histologic grading system: 4 grade system   10/02/2022 Surgery   Small bowel resection showed well-differentiated neuroendocrine tumor, WHO grade 2, multifocal 33 of 59 lymph nodes involved by metastatic carcinoma, measuring up to 7 mm in greatest extent.  With extranodal extension Surgical margins are negative for malignancy. pT4 pN2  Appendix-benign vermiform appendix with no significant histopathologic changes.  Negative for dysplasia and malignancy.     INTERVAL HISTORY Donna Ferguson is a 84 y.o. female who has above history reviewed by me today presents for follow up visit for neuroendocrine carcinoma of small intestine. She reports feeling well.  oday she denies any abdominal pain.  Bowel habits are different.   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  She uses Imodium as needed with relief of symptoms.    Review of Systems  Constitutional:   Negative for appetite change, chills, fatigue and fever.  HENT:   Negative for hearing loss and voice change.   Eyes:  Negative for eye problems.  Respiratory:  Negative for chest tightness and cough.   Cardiovascular:  Negative for chest pain.  Gastrointestinal:  Positive for diarrhea. Negative for abdominal distention, abdominal pain and blood in stool.  Endocrine: Negative for hot flashes.  Genitourinary:  Negative for difficulty urinating and frequency.   Musculoskeletal:  Negative for arthralgias.  Skin:  Negative for itching and rash.  Neurological:  Negative for extremity weakness.  Hematological:  Negative for adenopathy.  Psychiatric/Behavioral:  Negative for confusion.     MEDICAL HISTORY:  Past Medical History:  Diagnosis Date   Aortic atherosclerosis    Arthritis    Chronic kidney disease, stage 3a (HCC)    Constipation    Diastolic dysfunction 01/04/2020   a.) TTE 01/04/2020: EF 60-65%, mild LVH, triv MR, mild-mod AoV sclerosis without stenosis, G1DD.   Diverticulosis    Dyspnea    Heart murmur    History of colonoscopy    2019 in [place]; patient states no longer screening for colon cancer.   Hypertension    IBS (irritable bowel syndrome)    Lower back pain    Pneumonia    PONV (postoperative nausea and vomiting)    Pre-diabetes  Renal cyst    Right leg DVT (HCC)     SURGICAL HISTORY: Past Surgical History:  Procedure Laterality Date   ABDOMINAL HYSTERECTOMY     unsure if has ovaries.    BOWEL RESECTION N/A 10/02/2022   Procedure: SMALL BOWEL RESECTION, open, possible colectomy, RNFA to assist;  Surgeon: Jordis Laneta FALCON, MD;  Location: [facility] ORS;  Service: General;  Laterality: N/A;   BREAST CYST EXCISION Bilateral    BREAST EXCISIONAL BIOPSY Bilateral late 80s, early 90s    benign   BREAST SURGERY     COLONOSCOPY     COLONOSCOPY WITH PROPOFOL  N/A 05/31/2022   Procedure: COLONOSCOPY WITH PROPOFOL ;  Surgeon: Unk Corinn Skiff, MD;  Location: [facility]  ENDOSCOPY;  Service: Gastroenterology;  Laterality: N/A;   JOINT REPLACEMENT     REPLACEMENT TOTAL KNEE Left 2017   TOTAL KNEE ARTHROPLASTY Right 12/28/2021   Procedure: TOTAL KNEE ARTHROPLASTY;  Surgeon: Edie Norleen PARAS, MD;  Location: [facility] ORS;  Service: Orthopedics;  Laterality: Right;    SOCIAL HISTORY: Social History   Socioeconomic History   Marital status: Married    Spouse name: Roosevelt   Number of children: 2   Years of education: Not on file   Highest education level: Not on file  Occupational History   Not on file  Tobacco Use   Smoking status: Never    Passive exposure: Past   Smokeless tobacco: Never  Vaping Use   Vaping status: Never Used  Substance and Sexual Activity   Alcohol use: Never   Drug use: Never   Sexual activity: Yes    Birth control/protection: Surgical  Other Topics Concern   Not on file  Social History Narrative   [employer]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    Enjoys painting   Social Drivers of Health   Tobacco Use: Low Risk (09/04/2024)   Patient History    Smoking Tobacco Use: Never    Smokeless Tobacco Use: Never    Passive Exposure: Past  Financial Resource Strain: Low Risk  (02/07/2024)   Received from [facility]   Overall Financial Resource Strain (CARDIA)    Difficulty of Paying Living Expenses: Not very hard  Food Insecurity: No Food Insecurity (02/07/2024)   Received from [facility]   Epic    Within the past 12 months, you worried that your food would run out before you got the money to buy more.: Never true    Within the past 12 months, the food you bought just didn't last and you didn't have money to get more.: Never true  Transportation Needs: No Transportation Needs (02/07/2024)   Received from [facility] - Transportation    In the past 12  months, has lack of transportation kept you from medical appointments or from getting medications?: No    Lack of Transportation (Non-Medical): No  Physical Activity: Inactive (09/23/2023)   Exercise Vital Sign    Days of Exercise per Week: 0 days    Minutes of Exercise per Session: 0 min  Stress: No Stress Concern Present (09/23/2023)   [employer] of Occupational [employer] - Occupational Stress Questionnaire    Feeling of Stress : Not at all  Social Connections: Moderately Isolated (09/23/2023)   Social Connection and Isolation Panel    Frequency of Communication with  Friends and Family: More than three times a week    Frequency of Social Gatherings with Friends and Family: More than three times a week    Attends Religious Services: Never    Database Administrator or Organizations: No    Attends Banker Meetings: Never    Marital Status: Married  Catering Manager Violence: Not At Risk (09/23/2023)   Humiliation, Afraid, Rape, and Kick questionnaire    Fear of Current or Ex-Partner: No    Emotionally Abused: No    Physically Abused: No    Sexually Abused: No  Depression (PHQ2-9): Low Risk (05/25/2024)   Depression (PHQ2-9)    PHQ-2 Score: 0  Alcohol Screen: Low Risk (09/23/2023)   Alcohol Screen    Last Alcohol Screening Score (AUDIT): 0  Housing: Low Risk  (02/07/2024)   Received [facility]   Epic    In the last 12 months, was there a time when you were not able to pay the mortgage or rent on time?: No    In the past 12 months, how many times have you moved where you were living?: 0    At any time in the past 12 months, were you homeless or living in a shelter (including now)?: No  Utilities: Not At Risk (02/07/2024)   Received from [facility]   Epic    In the past 12 months has the electric, gas, oil, or water company threatened to shut off services in your home?: No  Health Literacy: Adequate Health Literacy (09/23/2023)   B1300  Health Literacy    Frequency of need for help with medical instructions: Never    FAMILY HISTORY: Family History  Problem Relation Age of Onset   Hypertension Mother    Diabetes Father    Hypertension Father    Heart disease Father    Hypertension Sister    Throat cancer Sister    Hypertension Daughter    Breast cancer Neg Hx     ALLERGIES:  has no known allergies.  MEDICATIONS:  Current Outpatient Medications  Medication Sig Dispense Refill   acetaminophen  (TYLENOL ) 650 MG CR tablet Take 650 mg by mouth daily.     amLODipine  (NORVASC ) 5 MG tablet TAKE 1 TABLET(5 MG) BY MOUTH EVERY EVENING 90 tablet 3   Calcium  Citrate-Vitamin D  (CALCIUM  CITRATE + PO) Take 400 mg by mouth daily. Take 1 pill 1 day then the next days she takes 2 pills     cholecalciferol  (VITAMIN D3) 25 MCG (1000 UNIT) tablet Take 1,000 Units by mouth daily.     losartan  (COZAAR ) 25 MG tablet TAKE 1 TABLET(25 MG) BY MOUTH DAILY 90 tablet 3   metoprolol  succinate (TOPROL -XL) 50 MG 24 hr tablet TAKE 1 TABLET(50 MG) BY MOUTH DAILY WITH OR IMMEDIATELY FOLLOWING A MEAL 90 tablet 3   Multiple Vitamins-Minerals (CENTRUM SILVER 50+WOMEN) TABS Take 1 tablet by mouth daily.     trospium  (SANCTURA ) 20 MG tablet Take 1 tablet (20 mg total) by mouth at bedtime. 90 tablet 3   vitamin C (ASCORBIC ACID ) 500 MG tablet Take 1,000 mg by mouth daily.     No current facility-administered medications for this visit.     PHYSICAL EXAMINATION: ECOG PERFORMANCE STATUS: 0 - Asymptomatic Vitals:   09/04/24 1002  BP: 138/74  Pulse: 73  Resp: 18  Temp: (!) 97.1 F (36.2 C)  SpO2: 100%   Filed Weights   09/04/24 1002  Weight: 152 lb 12.8  oz (69.3 kg)    Physical Exam Constitutional:      General: She is not in acute distress. HENT:     Head: Normocephalic and atraumatic.  Eyes:     General: No scleral icterus. Cardiovascular:     Rate and Rhythm: Normal rate.  Pulmonary:     Effort: Pulmonary effort is normal. No  respiratory distress.  Abdominal:     General: There is no distension.  Musculoskeletal:        General: No deformity. Normal range of motion.     Cervical back: Normal range of motion and neck supple.  Skin:    General: Skin is warm and dry.     Findings: No erythema or rash.  Neurological:     Mental Status: She is alert and oriented to person, place, and time. Mental status is at baseline.     Cranial Nerves: No cranial nerve deficit.  Psychiatric:        Mood and Affect: Mood normal.     LABORATORY DATA:  I have reviewed the data as listed    Latest Ref Rng & Units 09/04/2024   11:06 AM 02/26/2024    1:25 PM 07/23/2023    8:08 AM  CBC  WBC 4.0 - 10.5 K/uL 9.8  10.2  8.6   Hemoglobin 12.0 - 15.0 g/dL 86.9  87.4  87.2   Hematocrit 36.0 - 46.0 % 39.6  38.6  40.0   Platelets 150 - 400 K/uL 279  325.0  274       Latest Ref Rng & Units 09/04/2024   11:06 AM 06/09/2024    8:38 AM 07/23/2023    8:08 AM  CMP  Glucose 70 - 99 mg/dL 894  98  97   BUN 8 - 23 mg/dL 23  25  26    Creatinine 0.44 - 1.00 mg/dL 9.08  8.89  8.87   Sodium 135 - 145 mmol/L 136  139  136   Potassium 3.5 - 5.1 mmol/L 3.8  4.4  4.1   Chloride 98 - 111 mmol/L 102  103  103   CO2 22 - 32 mmol/L 23  27  24    Calcium  8.9 - 10.3 mg/dL 9.9  9.4  9.1   Total Protein 6.5 - 8.1 g/dL 7.4  6.9  6.8   Total Bilirubin 0.0 - 1.2 mg/dL 0.4  0.5  0.5   Alkaline Phos 38 - 126 U/L 108  97  115   AST 15 - 41 U/L 24  21  18    ALT 0 - 44 U/L 13  17  14        RADIOGRAPHIC STUDIES: I have personally reviewed the radiological images as listed and agreed with the findings in the report. NM PET DOTATATE SKULL BASE TO MID THIGH Result Date: 08/23/2024 EXAM: PET AND CT SKULL BASE TO MID THIGH 08/21/2024 01:57:33 PM TECHNIQUE: RADIOPHARMACEUTICAL: 4.40 mCi copper -64 DOTATATE Uptake time 60 minutes. PET imaging was acquired from the base of the skull to the mid thighs. Non-contrast enhanced computed tomography was obtained for  attenuation correction and anatomic localization. COMPARISON: Prior DOTATATE Scan: 07/31/2023 CLINICAL HISTORY: Neuroendocrine carcinoma of small bowel 10/02/2022. Small bowel resection. No antibiotics/steroids. No sandostatin/somatostatin injections. FINDINGS: HEAD AND NECK: Physiologic activity within the pituitary gland, salivary glands, and thyroid  gland. No DOTATATE-avid cervical lymphadenopathy. CHEST: No DOTATATE-avid pulmonary nodules or lymphadenopathy. No suspicious pulmonary nodules. ABDOMEN AND PELVIS: Physiologic activity within the liver, spleen, adrenal glands, kidneys, and bowel. No abnormal  activity within the liver. Post small bowel resection. No abnormality identified in the small bowel. There is some small bowel dilatation at the anastomosis without evidence of small bowel obstruction. Rounded nodule adjacent to the ascending colon measuring 7 mm on image 115 does not have DOTATATE activity. Second smaller nodule in the ileocecal mesentery measuring 6 mm on image 109 also does not have DOTATATE activity. These 2 small ileocecal nodules are not present on comparison exam 07/31/23. There are no retroperitoneal or mesenteric DOTATATE-avid implants within the peritoneum or mesentery. No DOTATATE-avid intraperitoneal mass or lymphadenopathy. BONES AND SOFT TISSUE: No DOTATATE activity within the bones. IMPRESSION: 1. No DOTATATE-avid disease. 2. Two new small ileocecal nodes (7 mm and 6 mm) without DOTATATE activity are increased from comparison exam. 3. Small bowel dilatation at the anastomosis without evidence of small bowel obstruction. Electronically signed by: Norleen Boxer MD 08/23/2024 11:41 AM EST RP Workstation: [id number]   "

## 2024-09-04 NOTE — Telephone Encounter (Signed)
 Called pt to let her know of PET scan and MD follow up - told her that I was mailing out AVS w/appt details and that she is more than welcome to call if she has questions or needs to r/s - [facility]

## 2024-09-06 LAB — CHROMOGRANIN A: Chromogranin A (ng/mL): 140.7 ng/mL — ABNORMAL HIGH (ref 0.0–101.8)

## 2024-09-08 ENCOUNTER — Ambulatory Visit

## 2024-09-10 ENCOUNTER — Ambulatory Visit

## 2024-09-15 ENCOUNTER — Telehealth: Payer: Self-pay

## 2024-09-15 ENCOUNTER — Ambulatory Visit

## 2024-09-15 NOTE — Telephone Encounter (Signed)
 Copied from CRM [id number]. Topic: Referral - Question >> Sep 15, 2024  2:29 PM Laymon HERO wrote: Reason for CRM: Patient is needing to have referral [place] [facility] education services at [facility] [phone]

## 2024-09-17 ENCOUNTER — Ambulatory Visit

## 2024-09-18 ENCOUNTER — Other Ambulatory Visit: Payer: Self-pay | Admitting: Family

## 2024-09-18 DIAGNOSIS — R7303 Prediabetes: Secondary | ICD-10-CM

## 2024-09-18 NOTE — Telephone Encounter (Signed)
 Called pt & spoke with husband. He stated she was unavailable at the moment and to cal about around 1 oclock

## 2024-09-18 NOTE — Telephone Encounter (Signed)
 Called pt back she is aware.

## 2024-09-24 ENCOUNTER — Other Ambulatory Visit: Payer: Self-pay

## 2024-09-24 DIAGNOSIS — C7A8 Other malignant neuroendocrine tumors: Secondary | ICD-10-CM

## 2024-09-28 ENCOUNTER — Ambulatory Visit: Admitting: Family

## 2024-09-29 ENCOUNTER — Ambulatory Visit

## 2024-09-29 ENCOUNTER — Ambulatory Visit: Payer: Medicare Other

## 2024-10-12 ENCOUNTER — Encounter: Admitting: Dietician

## 2025-03-05 ENCOUNTER — Other Ambulatory Visit

## 2025-03-12 ENCOUNTER — Inpatient Hospital Stay: Admitting: Oncology
